# Patient Record
Sex: Female | Born: 1945
Health system: Southern US, Community
[De-identification: ages and names within clinical notes are randomized; demographics above are authoritative.]

## PROBLEM LIST (undated history)

## (undated) DIAGNOSIS — G709 Myoneural disorder, unspecified: Secondary | ICD-10-CM

## (undated) DIAGNOSIS — M1990 Unspecified osteoarthritis, unspecified site: Secondary | ICD-10-CM

## (undated) DIAGNOSIS — E785 Hyperlipidemia, unspecified: Secondary | ICD-10-CM

## (undated) DIAGNOSIS — R42 Dizziness and giddiness: Secondary | ICD-10-CM

## (undated) HISTORY — DX: Dizziness and giddiness: R42

## (undated) HISTORY — PX: ABDOMINAL HYSTERECTOMY: SHX81

## (undated) HISTORY — DX: Hyperlipidemia, unspecified: E78.5

---

## 2010-03-30 ENCOUNTER — Ambulatory Visit: Payer: Self-pay | Admitting: Family Medicine

## 2013-02-19 DIAGNOSIS — Z789 Other specified health status: Secondary | ICD-10-CM | POA: Diagnosis not present

## 2013-02-19 DIAGNOSIS — E785 Hyperlipidemia, unspecified: Secondary | ICD-10-CM | POA: Diagnosis not present

## 2013-02-19 DIAGNOSIS — E639 Nutritional deficiency, unspecified: Secondary | ICD-10-CM | POA: Diagnosis not present

## 2013-02-19 DIAGNOSIS — I10 Essential (primary) hypertension: Secondary | ICD-10-CM | POA: Diagnosis not present

## 2013-02-19 DIAGNOSIS — Z1159 Encounter for screening for other viral diseases: Secondary | ICD-10-CM | POA: Diagnosis not present

## 2013-02-19 DIAGNOSIS — E559 Vitamin D deficiency, unspecified: Secondary | ICD-10-CM | POA: Diagnosis not present

## 2013-02-28 ENCOUNTER — Ambulatory Visit: Payer: Self-pay | Admitting: Family Medicine

## 2013-02-28 DIAGNOSIS — Z1231 Encounter for screening mammogram for malignant neoplasm of breast: Secondary | ICD-10-CM | POA: Diagnosis not present

## 2013-03-05 DIAGNOSIS — Z0000 Encounter for general adult medical examination without abnormal findings: Secondary | ICD-10-CM | POA: Diagnosis not present

## 2013-10-01 DIAGNOSIS — Z23 Encounter for immunization: Secondary | ICD-10-CM | POA: Diagnosis not present

## 2014-12-24 DIAGNOSIS — E2839 Other primary ovarian failure: Secondary | ICD-10-CM | POA: Diagnosis not present

## 2014-12-24 DIAGNOSIS — E559 Vitamin D deficiency, unspecified: Secondary | ICD-10-CM | POA: Diagnosis not present

## 2014-12-24 DIAGNOSIS — Z124 Encounter for screening for malignant neoplasm of cervix: Secondary | ICD-10-CM | POA: Diagnosis not present

## 2014-12-24 DIAGNOSIS — Z1211 Encounter for screening for malignant neoplasm of colon: Secondary | ICD-10-CM | POA: Diagnosis not present

## 2014-12-24 DIAGNOSIS — E785 Hyperlipidemia, unspecified: Secondary | ICD-10-CM | POA: Diagnosis not present

## 2014-12-24 DIAGNOSIS — Z0000 Encounter for general adult medical examination without abnormal findings: Secondary | ICD-10-CM | POA: Diagnosis not present

## 2014-12-24 DIAGNOSIS — Z789 Other specified health status: Secondary | ICD-10-CM | POA: Diagnosis not present

## 2014-12-24 DIAGNOSIS — Z9181 History of falling: Secondary | ICD-10-CM | POA: Diagnosis not present

## 2014-12-24 DIAGNOSIS — M8580 Other specified disorders of bone density and structure, unspecified site: Secondary | ICD-10-CM | POA: Diagnosis not present

## 2014-12-24 DIAGNOSIS — Z1239 Encounter for other screening for malignant neoplasm of breast: Secondary | ICD-10-CM | POA: Diagnosis not present

## 2014-12-24 DIAGNOSIS — Z719 Counseling, unspecified: Secondary | ICD-10-CM | POA: Diagnosis not present

## 2014-12-24 DIAGNOSIS — Z1389 Encounter for screening for other disorder: Secondary | ICD-10-CM | POA: Diagnosis not present

## 2015-01-07 ENCOUNTER — Ambulatory Visit
Admit: 2015-01-07 | Disposition: A | Discharge status: Home / Self Care | Payer: Self-pay | Attending: Family Medicine | Admitting: Family Medicine

## 2015-01-07 DIAGNOSIS — M810 Age-related osteoporosis without current pathological fracture: Secondary | ICD-10-CM | POA: Diagnosis not present

## 2015-01-07 DIAGNOSIS — E2839 Other primary ovarian failure: Secondary | ICD-10-CM | POA: Diagnosis not present

## 2015-01-07 DIAGNOSIS — Z1231 Encounter for screening mammogram for malignant neoplasm of breast: Secondary | ICD-10-CM | POA: Diagnosis not present

## 2015-01-09 DIAGNOSIS — E785 Hyperlipidemia, unspecified: Secondary | ICD-10-CM | POA: Diagnosis not present

## 2015-01-09 DIAGNOSIS — M8580 Other specified disorders of bone density and structure, unspecified site: Secondary | ICD-10-CM | POA: Diagnosis not present

## 2015-01-09 DIAGNOSIS — Z789 Other specified health status: Secondary | ICD-10-CM | POA: Diagnosis not present

## 2015-01-09 DIAGNOSIS — E559 Vitamin D deficiency, unspecified: Secondary | ICD-10-CM | POA: Diagnosis not present

## 2015-01-09 DIAGNOSIS — M810 Age-related osteoporosis without current pathological fracture: Secondary | ICD-10-CM | POA: Diagnosis not present

## 2015-03-11 ENCOUNTER — Inpatient Hospital Stay
Admission: EM | Admit: 2015-03-11 | Discharge: 2015-03-18 | DRG: 336 | Disposition: A | Discharge status: Home / Self Care | Payer: Medicare Other | Attending: Surgery | Admitting: Surgery

## 2015-03-11 ENCOUNTER — Encounter: Payer: Self-pay | Admitting: *Deleted

## 2015-03-11 DIAGNOSIS — Z90710 Acquired absence of both cervix and uterus: Secondary | ICD-10-CM

## 2015-03-11 DIAGNOSIS — K5669 Other intestinal obstruction: Secondary | ICD-10-CM | POA: Diagnosis not present

## 2015-03-11 DIAGNOSIS — K449 Diaphragmatic hernia without obstruction or gangrene: Secondary | ICD-10-CM | POA: Diagnosis not present

## 2015-03-11 DIAGNOSIS — K769 Liver disease, unspecified: Secondary | ICD-10-CM | POA: Diagnosis not present

## 2015-03-11 DIAGNOSIS — K565 Intestinal adhesions [bands], unspecified as to partial versus complete obstruction: Secondary | ICD-10-CM

## 2015-03-11 DIAGNOSIS — R188 Other ascites: Secondary | ICD-10-CM | POA: Diagnosis not present

## 2015-03-11 DIAGNOSIS — R1031 Right lower quadrant pain: Secondary | ICD-10-CM | POA: Diagnosis not present

## 2015-03-11 DIAGNOSIS — Z8719 Personal history of other diseases of the digestive system: Secondary | ICD-10-CM | POA: Diagnosis present

## 2015-03-11 DIAGNOSIS — K5660 Unspecified intestinal obstruction: Secondary | ICD-10-CM | POA: Diagnosis not present

## 2015-03-11 LAB — CBC WITH DIFFERENTIAL/PLATELET
BASOS ABS: 0 10*3/uL (ref 0–0.1)
Basophils Relative: 0 %
EOS ABS: 0.1 10*3/uL (ref 0–0.7)
Eosinophils Relative: 1 %
HCT: 44.5 % (ref 35.0–47.0)
Hemoglobin: 14.6 g/dL (ref 12.0–16.0)
LYMPHS PCT: 14 %
Lymphs Abs: 1.2 10*3/uL (ref 1.0–3.6)
MCH: 26.8 pg (ref 26.0–34.0)
MCHC: 32.7 g/dL (ref 32.0–36.0)
MCV: 82.1 fL (ref 80.0–100.0)
MONOS PCT: 5 %
Monocytes Absolute: 0.5 10*3/uL (ref 0.2–0.9)
Neutro Abs: 6.9 10*3/uL — ABNORMAL HIGH (ref 1.4–6.5)
Neutrophils Relative %: 80 %
PLATELETS: 260 10*3/uL (ref 150–440)
RBC: 5.43 MIL/uL — AB (ref 3.80–5.20)
RDW: 13.9 % (ref 11.5–14.5)
WBC: 8.6 10*3/uL (ref 3.6–11.0)

## 2015-03-11 LAB — URINALYSIS COMPLETE WITH MICROSCOPIC (ARMC ONLY)
BILIRUBIN URINE: NEGATIVE
GLUCOSE, UA: NEGATIVE mg/dL
Ketones, ur: NEGATIVE mg/dL
LEUKOCYTES UA: NEGATIVE
Nitrite: NEGATIVE
Protein, ur: NEGATIVE mg/dL
SQUAMOUS EPITHELIAL / LPF: NONE SEEN
Specific Gravity, Urine: 1.006 (ref 1.005–1.030)
pH: 9 — ABNORMAL HIGH (ref 5.0–8.0)

## 2015-03-11 LAB — COMPREHENSIVE METABOLIC PANEL
ALBUMIN: 4.7 g/dL (ref 3.5–5.0)
ALT: 21 U/L (ref 14–54)
AST: 29 U/L (ref 15–41)
Alkaline Phosphatase: 93 U/L (ref 38–126)
Anion gap: 10 (ref 5–15)
BUN: 12 mg/dL (ref 6–20)
CO2: 28 mmol/L (ref 22–32)
Calcium: 9.3 mg/dL (ref 8.9–10.3)
Chloride: 100 mmol/L — ABNORMAL LOW (ref 101–111)
Creatinine, Ser: 0.75 mg/dL (ref 0.44–1.00)
GFR calc Af Amer: >60 mL/min (ref >60)
Glucose, Bld: 111 mg/dL — ABNORMAL HIGH (ref 65–99)
Potassium: 3.6 mmol/L (ref 3.5–5.1)
Sodium: 138 mmol/L (ref 135–145)
TOTAL PROTEIN: 8.6 g/dL — AB (ref 6.5–8.1)
Total Bilirubin: 0.4 mg/dL (ref 0.3–1.2)

## 2015-03-11 MED ORDER — ONDANSETRON HCL 4 MG/2ML IJ SOLN
INTRAMUSCULAR | Status: AC
  Administered 2015-03-12: 4 mg via INTRAVENOUS
  Filled 2015-03-11: qty 2

## 2015-03-11 MED ORDER — ONDANSETRON HCL 4 MG/2ML IJ SOLN
4.0000 mg | Freq: Once | INTRAMUSCULAR | Status: AC
  Administered 2015-03-12: 4 mg via INTRAVENOUS

## 2015-03-11 MED ORDER — MORPHINE SULFATE 2 MG/ML IJ SOLN
2.0000 mg | Freq: Once | INTRAMUSCULAR | Status: AC
  Administered 2015-03-12: 2 mg via INTRAVENOUS

## 2015-03-11 MED ORDER — IOHEXOL 240 MG/ML SOLN
25.0000 mL | Freq: Once | INTRAMUSCULAR | Status: AC | PRN
  Administered 2015-03-11: 25 mL via ORAL

## 2015-03-11 MED ORDER — MORPHINE SULFATE 2 MG/ML IJ SOLN
INTRAMUSCULAR | Status: AC
  Administered 2015-03-12: 2 mg via INTRAVENOUS
  Filled 2015-03-11: qty 1

## 2015-03-11 NOTE — ED Notes (Signed)
Pt to ED from home c/o lower right abdominal pain starting today around 1400.  Pt denies n/v/d.  Pain does not radiate.  Pt states "I want to go to the bathroom but I can't".  Pt states small bowel movement around 1800 tonight.

## 2015-03-11 NOTE — ED Provider Notes (Signed)
[facility] Emergency Department Provider Note  ____________________________________________  Time seen: 11:00 PM  I have reviewed the triage vital signs and the nursing notes.   HISTORY  Chief Complaint Abdominal Pain      HPI Paula Mills is a 69 y.o. female presents with acute onset of right lower quadrant pain currently 8 out of 10 and described as sharp. Onset at 2 PM today pain is nonradiating no nausea no vomiting no diarrhea and no fever.     History reviewed. No pertinent past medical history.  There are no active problems to display for this patient.   Past Surgical History  Procedure Laterality Date  . Abdominal hysterectomy    . Cesarean section      No current outpatient prescriptions on file.  Allergies Review of patient's allergies indicates no known allergies.  No family history on file.  Social History History  Substance Use Topics  . Smoking status: Never Smoker   . Smokeless tobacco: Not on file  . Alcohol Use: Yes    Review of Systems  Constitutional: Negative for fever. Eyes: Negative for visual changes. ENT: Negative for sore throat. Cardiovascular: Negative for chest pain. Respiratory: Negative for shortness of breath. Gastrointestinal: Positive for abdominal pain,  Genitourinary: Negative for dysuria. Musculoskeletal: Negative for back pain. Skin: Negative for rash. Neurological: Negative for headaches, focal weakness or numbness.   10-point ROS otherwise negative.  ____________________________________________   PHYSICAL EXAM:  VITAL SIGNS: ED Triage Vitals  Enc Vitals Group     BP 03/11/15 2045 176/99 mmHg     Pulse Rate 03/11/15 2045 72     Resp --      Temp 03/11/15 2045 98.2 F (36.8 C)     Temp Source 03/11/15 2045 Oral     SpO2 03/11/15 2045 96 %     Weight 03/11/15 2045 165 lb (74.844 kg)     Height 03/11/15 2045 5\' 2"  (1.575 m)     Head Cir --      Peak Flow --      Pain Score  03/11/15 2050 9     Pain Loc --      Pain Edu? --      Excl. in [place]? --      Constitutional: Alert and oriented. Well appearing and in no distress. Eyes: Conjunctivae are normal. PERRL. Normal extraocular movements. ENT   Head: Normocephalic and atraumatic.   Nose: No congestion/rhinnorhea.   Mouth/Throat: Mucous membranes are moist.   Neck: No stridor. Hematological/Lymphatic/Immunilogical: No cervical lymphadenopathy. Cardiovascular: Normal rate, regular rhythm. Normal and symmetric distal pulses are present in all extremities. No murmurs, rubs, or gallops. Respiratory: Normal respiratory effort without tachypnea nor retractions. Breath sounds are clear and equal bilaterally. No wheezes/rales/rhonchi. Gastrointestinal: Soft and nontender. No distention. There is no CVA tenderness. Genitourinary: deferred Musculoskeletal: Nontender with normal range of motion in all extremities. No joint effusions.  No lower extremity tenderness nor edema. Neurologic:  Normal speech and language. No gross focal neurologic deficits are appreciated. Speech is normal.  Skin:  Skin is warm, dry and intact. No rash noted. Psychiatric: Mood and affect are normal. Speech and behavior are normal. Patient exhibits appropriate insight and judgment.  ____________________________________________    LABS (pertinent positives/negatives)  Labs Reviewed  CBC WITH DIFFERENTIAL/PLATELET - Abnormal; Notable for the following:    RBC 5.43 (*)    Neutro Abs 6.9 (*)    All other components within normal limits  COMPREHENSIVE METABOLIC PANEL -  Abnormal; Notable for the following:    Chloride 100 (*)    Glucose, Bld 111 (*)    Total Protein 8.6 (*)    All other components within normal limits  URINALYSIS COMPLETEWITH MICROSCOPIC ([facility] ONLY) - Abnormal; Notable for the following:    Color, Urine STRAW (*)    APPearance CLEAR (*)    Hgb urine dipstick 1+ (*)    pH 9.0 (*)    Bacteria, UA RARE (*)     All other components within normal limits       RADIOLOGY  CT abdomen and pelvis revealed small bowel obstruction with transition point in the  ileum  ____________________________________________      INITIAL IMPRESSION / ASSESSMENT AND PLAN / ED COURSE  Pertinent labs & imaging results that were available during my care of the patient were reviewed by me and considered in my medical decision making (see chart for details).  Given history of physical exam concern for intraabdominal pathology will  perform CT scan of the abdomen and pelvis. Patient received IV morphine 2 mg Zofran 4 mg.  (----------------------------------------- 1:50 AM on 03/12/2015 -----------------------------------------  CT scan revealed small bowel obstruction as such Dr. Felton Clinton consulted  ____________________________________________   FINAL CLINICAL IMPRESSION(S) / ED DIAGNOSES  Final diagnoses:  None      Gregor Hams, MD 03/12/15 0151

## 2015-03-11 NOTE — ED Notes (Signed)
Right lower abdominal pain that started this afternoon, denies n/v/d or urinary symptoms

## 2015-03-12 ENCOUNTER — Emergency Department: Payer: Medicare Other

## 2015-03-12 DIAGNOSIS — Z8719 Personal history of other diseases of the digestive system: Secondary | ICD-10-CM | POA: Diagnosis present

## 2015-03-12 DIAGNOSIS — K5660 Unspecified intestinal obstruction: Secondary | ICD-10-CM | POA: Diagnosis not present

## 2015-03-12 DIAGNOSIS — K5669 Other intestinal obstruction: Secondary | ICD-10-CM | POA: Diagnosis not present

## 2015-03-12 DIAGNOSIS — Z90710 Acquired absence of both cervix and uterus: Secondary | ICD-10-CM | POA: Diagnosis not present

## 2015-03-12 DIAGNOSIS — K598 Other specified functional intestinal disorders: Secondary | ICD-10-CM | POA: Diagnosis not present

## 2015-03-12 DIAGNOSIS — K449 Diaphragmatic hernia without obstruction or gangrene: Secondary | ICD-10-CM | POA: Diagnosis not present

## 2015-03-12 DIAGNOSIS — R188 Other ascites: Secondary | ICD-10-CM | POA: Diagnosis present

## 2015-03-12 DIAGNOSIS — R109 Unspecified abdominal pain: Secondary | ICD-10-CM | POA: Diagnosis not present

## 2015-03-12 DIAGNOSIS — K565 Intestinal adhesions [bands] with obstruction (postprocedural) (postinfection): Secondary | ICD-10-CM | POA: Diagnosis not present

## 2015-03-12 DIAGNOSIS — K769 Liver disease, unspecified: Secondary | ICD-10-CM | POA: Diagnosis not present

## 2015-03-12 LAB — BASIC METABOLIC PANEL
Anion gap: 8 (ref 5–15)
BUN: 7 mg/dL (ref 6–20)
CO2: 26 mmol/L (ref 22–32)
Calcium: 8.6 mg/dL — ABNORMAL LOW (ref 8.9–10.3)
Chloride: 105 mmol/L (ref 101–111)
Creatinine, Ser: 0.59 mg/dL (ref 0.44–1.00)
GFR calc Af Amer: >60 mL/min (ref >60)
Glucose, Bld: 157 mg/dL — ABNORMAL HIGH (ref 65–99)
Potassium: 4 mmol/L (ref 3.5–5.1)
SODIUM: 139 mmol/L (ref 135–145)

## 2015-03-12 LAB — CBC
HEMATOCRIT: 44.1 % (ref 35.0–47.0)
Hemoglobin: 14.3 g/dL (ref 12.0–16.0)
MCH: 26.6 pg (ref 26.0–34.0)
MCHC: 32.5 g/dL (ref 32.0–36.0)
MCV: 81.9 fL (ref 80.0–100.0)
PLATELETS: 245 10*3/uL (ref 150–440)
RBC: 5.38 MIL/uL — ABNORMAL HIGH (ref 3.80–5.20)
RDW: 13.9 % (ref 11.5–14.5)
WBC: 8.2 10*3/uL (ref 3.6–11.0)

## 2015-03-12 MED ORDER — FLEET ENEMA 7-19 GM/118ML RE ENEM
1.0000 | ENEMA | Freq: Two times a day (BID) | RECTAL | Status: DC
  Administered 2015-03-12 – 2015-03-14 (×4): 1 via RECTAL

## 2015-03-12 MED ORDER — IOHEXOL 350 MG/ML SOLN
100.0000 mL | Freq: Once | INTRAVENOUS | Status: AC | PRN
  Administered 2015-03-12: 100 mL via INTRAVENOUS

## 2015-03-12 MED ORDER — HEPARIN SODIUM (PORCINE) 5000 UNIT/ML IJ SOLN
5000.0000 [IU] | Freq: Three times a day (TID) | INTRAMUSCULAR | Status: DC
  Administered 2015-03-12 – 2015-03-17 (×15): 5000 [IU] via SUBCUTANEOUS
  Filled 2015-03-12 (×16): qty 1

## 2015-03-12 MED ORDER — ZOLPIDEM TARTRATE 5 MG PO TABS: 5.0000 mg | ORAL_TABLET | Freq: Every evening | ORAL | Status: DC | PRN

## 2015-03-12 MED ORDER — MORPHINE SULFATE 2 MG/ML IJ SOLN
INTRAMUSCULAR | Status: AC
  Administered 2015-03-12: 2 mg via INTRAVENOUS
  Filled 2015-03-12: qty 1

## 2015-03-12 MED ORDER — MORPHINE SULFATE 2 MG/ML IJ SOLN
2.0000 mg | INTRAMUSCULAR | Status: DC | PRN
  Administered 2015-03-12 – 2015-03-14 (×11): 2 mg via INTRAVENOUS
  Filled 2015-03-12 (×12): qty 1

## 2015-03-12 MED ORDER — ACETAMINOPHEN 650 MG RE SUPP: 650.0000 mg | Freq: Four times a day (QID) | RECTAL | Status: DC | PRN

## 2015-03-12 MED ORDER — KCL IN DEXTROSE-NACL 20-5-0.9 MEQ/L-%-% IV SOLN
INTRAVENOUS | Status: DC
  Administered 2015-03-12 – 2015-03-17 (×11): via INTRAVENOUS
  Filled 2015-03-12 (×21): qty 1000

## 2015-03-12 MED ORDER — BUTAMBEN-TETRACAINE-BENZOCAINE 2-2-14 % EX AERO
1.0000 | INHALATION_SPRAY | Freq: Once | CUTANEOUS | Status: AC
  Administered 2015-03-12: 1 via TOPICAL

## 2015-03-12 MED ORDER — MORPHINE SULFATE 2 MG/ML IJ SOLN
2.0000 mg | Freq: Once | INTRAMUSCULAR | Status: AC
Start: 2015-03-12 — End: 2015-03-12
  Administered 2015-03-12: 2 mg via INTRAVENOUS

## 2015-03-12 MED ORDER — ONDANSETRON HCL 4 MG/2ML IJ SOLN
4.0000 mg | Freq: Four times a day (QID) | INTRAMUSCULAR | Status: DC | PRN
  Administered 2015-03-12 – 2015-03-14 (×3): 4 mg via INTRAVENOUS
  Filled 2015-03-12 (×3): qty 2

## 2015-03-12 MED ORDER — ACETAMINOPHEN 325 MG PO TABS: 650.0000 mg | ORAL_TABLET | Freq: Four times a day (QID) | ORAL | Status: DC | PRN

## 2015-03-12 MED ORDER — DOCUSATE SODIUM 283 MG RE ENEM: 1.0000 | ENEMA | Freq: Two times a day (BID) | RECTAL | Status: DC

## 2015-03-12 MED ORDER — BISACODYL 5 MG PO TBEC
10.0000 mg | DELAYED_RELEASE_TABLET | Freq: Two times a day (BID) | ORAL | Status: DC
  Administered 2015-03-12 – 2015-03-14 (×5): 10 mg via ORAL
  Filled 2015-03-12 (×5): qty 2

## 2015-03-12 MED ORDER — ONDANSETRON HCL 4 MG PO TABS: 4.0000 mg | ORAL_TABLET | Freq: Four times a day (QID) | ORAL | Status: DC | PRN

## 2015-03-12 MED ORDER — BUTAMBEN-TETRACAINE-BENZOCAINE 2-2-14 % EX AERO
1.0000 | INHALATION_SPRAY | Freq: Once | CUTANEOUS | Status: DC
  Filled 2015-03-12: qty 20

## 2015-03-12 NOTE — Progress Notes (Signed)
Subjective:   Pain controlled with morphine. Very little, clear, and nasogastric output. No flatus, no bowel movement.  Vital signs in last 24 hours: Temp:  [98.2 F (36.8 C)-98.5 F (36.9 C)] 98.5 F (36.9 C) (06/09 0820) Pulse Rate:  [70-81] 72 (06/09 0820) Resp:  [16-18] 18 (06/09 0820) BP: (143-177)/(73-99) 143/73 mmHg (06/09 0820) SpO2:  [94 %-97 %] 94 % (06/09 0820) Weight:  [73.619 kg (162 lb 4.8 oz)-74.844 kg (165 lb)] 73.619 kg (162 lb 4.8 oz) (06/09 0346) Last BM Date: 03/11/15  Intake/Output from previous day: 06/08 0701 - 06/09 0700 In: 0  Out: 50 [Emesis/NG output:50]  Exam:  Abdomen is mildly distended but not tympanitic or tender.  Lab Results:  CBC  Recent Labs  03/11/15 2104 03/12/15 0444  WBC 8.6 8.2  HGB 14.6 14.3  HCT 44.5 44.1  PLT 260 245   CMP     Component Value Date/Time   NA 139 03/12/2015 0843   K 4.0 03/12/2015 0843   CL 105 03/12/2015 0843   CO2 26 03/12/2015 0843   GLUCOSE 157* 03/12/2015 0843   BUN 7 03/12/2015 0843   CREATININE 0.59 03/12/2015 0843   CALCIUM 8.6* 03/12/2015 0843   PROT 8.6* 03/11/2015 2104   ALBUMIN 4.7 03/11/2015 2104   AST 29 03/11/2015 2104   ALT 21 03/11/2015 2104   ALKPHOS 93 03/11/2015 2104   BILITOT 0.4 03/11/2015 2104   GFRNONAA >60 03/12/2015 0843   GFRAA >60 03/12/2015 0843   PT/INR No results for input(s): LABPROT, INR in the last 72 hours.  Studies/Results: Ct Abdomen Pelvis W Contrast  03/12/2015   CLINICAL DATA:  Right lower quadrant abdominal pain, onset today at 14:00.  EXAM: CT ABDOMEN AND PELVIS WITH CONTRAST  TECHNIQUE: Multidetector CT imaging of the abdomen and pelvis was performed using the standard protocol following bolus administration of intravenous contrast.  CONTRAST:  127mL OMNIPAQUE IOHEXOL 350 MG/ML SOLN  COMPARISON:  None.  FINDINGS: There is abnormal dilatation of proximal and mid small bowel with abrupt transition to decompressed ileum. The transition point is in the mid  abdomen just above the level of the umbilicus. The colon is decompressed except for a small volume of rectal stool. There is no extraluminal air. There is no ascites. There is no abscess.  There are multiple hypodense liver lesions, most of which are too small to characterize. The largest measures 10.5 mm. The gallbladder appears unremarkable. Bile ducts appear unremarkable. Pancreas, spleen, adrenals and kidneys appear unremarkable.  There is prior hysterectomy.  There is a small hiatal hernia.  The abdominal aorta is normal in caliber with mild atherosclerotic calcification.  There is no significant abnormality in the lower chest.  There is no significant musculoskeletal lesion. There is grade 1 degenerative-appearing anterolisthesis at L4-5. Moderate facet arthritis is present at L4-5.  IMPRESSION: 1. Moderately high-grade small bowel obstruction in the mid abdomen with decompressed ileum and colon. 2. Multiple hypodense liver lesions, more likely cysts but not conclusively characterized. 3. Small hiatal hernia 4. Lower lumbar facet arthritis and mild spondylolisthesis   Electronically Signed   By: Andreas Newport M.D.   On: 03/12/2015 01:14    Assessment/Plan: My review of the CT scan shows significant stool in the transverse colon and rectum with minimal small bowel dilation, consistent more with constipation than a bowel obstruction.  Plan: Discontinue nasogastric tube. Enemas and Dulcolax tablets.

## 2015-03-12 NOTE — H&P (Signed)
Paula Mills is an 69 y.o. female.     Chief Complaint:   RLQ abdominal pain.  HPI:   69 y/o healthy female presents to the emergency room with a sudden onset of lower abdominal and right lower quadrant abdominal pain beginning acutely at approximately 2 PM on Tuesday afternoon. Prior to this the patient denies any abdominal pain and distention nor nausea vomiting or sick contacts. Since 2 PM the patient has had continuous pain which has been colicky in nature located in the lower abdomen and right lower quadrant. There's been mild nausea but no vomiting no diarrhea last bowel movement was yesterday and described as small. Last passage of flatus for at least 48 hours ago. Of note the patient had a previous cesarean section 2 as well as a hysterectomy for fibroid disease.  She has never had a history of prior bowel obstruction. She had no complications from any of her prior cesarean sections her hysterectomy. She is accompanied by her husband.  While in the emergency room patient's had an antimesenteric as well as morphine. She is feeling somewhat better. There is been really no alleviating factors. Has no history of diverticulitis or jaundice. There has been no fever.   History reviewed. No pertinent past medical history.  Past Surgical History  Procedure Laterality Date  . Abdominal hysterectomy    . Cesarean section     Family history is negative for cancer and hypertension.  History reviewed. No pertinent family history.   Social History:  reports that she has never smoked. She does not have any smokeless tobacco history on file. She reports that she drinks alcohol. She reports that she does not use illicit drugs.  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  Allergies: No Known Allergies   Review of Systems:   Review of Systems  Constitutional: Negative for fever, chills, weight loss and malaise/fatigue.  HENT: Negative.   Eyes: Negative.   Respiratory: Negative for cough, hemoptysis  and sputum production.   Cardiovascular: Negative for chest pain and palpitations.  Gastrointestinal: Positive for nausea and abdominal pain. Negative for heartburn, vomiting, diarrhea and constipation.  Genitourinary: Negative for dysuria and urgency.  Musculoskeletal: Negative.   Psychiatric/Behavioral: Negative.   All other systems reviewed and are negative.   Physical Exam:  Physical Exam  Constitutional: She is oriented to person, place, and time and well-developed, well-nourished, and in no distress. No distress.  HENT:  Head: Atraumatic.  Eyes: Conjunctivae and EOM are normal. Pupils are equal, round, and reactive to light.  Neck: Normal range of motion. Neck supple. No thyromegaly present.  Cardiovascular: Normal rate and regular rhythm.   Pulmonary/Chest: Effort normal and breath sounds normal. No respiratory distress. She has no wheezes.  Abdominal: Soft. Bowel sounds are normal. There is generalized tenderness and tenderness in the right lower quadrant. There is no rigidity, no guarding, no CVA tenderness and negative Murphy's sign. No hernia.    Neurological: She is alert and oriented to person, place, and time.  Skin: Skin is dry. She is not diaphoretic.  Psychiatric: Mood, memory, affect and judgment normal.    Blood pressure 154/83, pulse 74, temperature 98.2 F (36.8 C), temperature source Oral, height 5' 2"  (1.575 m), weight 74.844 kg (165 lb), SpO2 94 %.    Results for orders placed or performed during the hospital encounter of 03/11/15 (from the past 48 hour(s))  CBC with Differential     Status: Abnormal   Collection Time: 03/11/15  9:04 PM  Result Value Ref Range   WBC 8.6 3.6 - 11.0 K/uL   RBC 5.43 (H) 3.80 - 5.20 MIL/uL   Hemoglobin 14.6 12.0 - 16.0 g/dL   HCT 44.5 35.0 - 47.0 %   MCV 82.1 80.0 - 100.0 fL   MCH 26.8 26.0 - 34.0 pg   MCHC 32.7 32.0 - 36.0 g/dL   RDW 13.9 11.5 - 14.5 %   Platelets 260 150 - 440 K/uL   Neutrophils Relative % 80 %    Neutro Abs 6.9 (H) 1.4 - 6.5 K/uL   Lymphocytes Relative 14 %   Lymphs Abs 1.2 1.0 - 3.6 K/uL   Monocytes Relative 5 %   Monocytes Absolute 0.5 0.2 - 0.9 K/uL   Eosinophils Relative 1 %   Eosinophils Absolute 0.1 0 - 0.7 K/uL   Basophils Relative 0 %   Basophils Absolute 0.0 0 - 0.1 K/uL  Comprehensive metabolic panel     Status: Abnormal   Collection Time: 03/11/15  9:04 PM  Result Value Ref Range   Sodium 138 135 - 145 mmol/L   Potassium 3.6 3.5 - 5.1 mmol/L   Chloride 100 (L) 101 - 111 mmol/L   CO2 28 22 - 32 mmol/L   Glucose, Bld 111 (H) 65 - 99 mg/dL   BUN 12 6 - 20 mg/dL   Creatinine, Ser 0.75 0.44 - 1.00 mg/dL   Calcium 9.3 8.9 - 10.3 mg/dL   Total Protein 8.6 (H) 6.5 - 8.1 g/dL   Albumin 4.7 3.5 - 5.0 g/dL   AST 29 15 - 41 U/L   ALT 21 14 - 54 U/L   Alkaline Phosphatase 93 38 - 126 U/L   Total Bilirubin 0.4 0.3 - 1.2 mg/dL   GFR calc non Af Amer >60 >60 mL/min   GFR calc Af Amer >60 >60 mL/min    Comment: (NOTE) The eGFR has been calculated using the CKD EPI equation. This calculation has not been validated in all clinical situations. eGFR's persistently <60 mL/min signify possible Chronic Kidney Disease.    Anion gap 10 5 - 15  Urinalysis complete, with microscopic ([facility] only)     Status: Abnormal   Collection Time: 03/11/15  9:04 PM  Result Value Ref Range   Color, Urine STRAW (A) YELLOW   APPearance CLEAR (A) CLEAR   Glucose, UA NEGATIVE NEGATIVE mg/dL   Bilirubin Urine NEGATIVE NEGATIVE   Ketones, ur NEGATIVE NEGATIVE mg/dL   Specific Gravity, Urine 1.006 1.005 - 1.030   Hgb urine dipstick 1+ (A) NEGATIVE   pH 9.0 (H) 5.0 - 8.0   Protein, ur NEGATIVE NEGATIVE mg/dL   Nitrite NEGATIVE NEGATIVE   Leukocytes, UA NEGATIVE NEGATIVE   RBC / HPF 0-5 0 - 5 RBC/hpf   WBC, UA 0-5 0 - 5 WBC/hpf   Bacteria, UA RARE (A) NONE SEEN   Squamous Epithelial / LPF NONE SEEN NONE SEEN   Ct Abdomen Pelvis W Contrast  03/12/2015   CLINICAL DATA:  Right lower quadrant  abdominal pain, onset today at 14:00.  EXAM: CT ABDOMEN AND PELVIS WITH CONTRAST  TECHNIQUE: Multidetector CT imaging of the abdomen and pelvis was performed using the standard protocol following bolus administration of intravenous contrast.  CONTRAST:  159m OMNIPAQUE IOHEXOL 350 MG/ML SOLN  COMPARISON:  None.  FINDINGS: There is abnormal dilatation of proximal and mid small bowel with abrupt transition to decompressed ileum. The transition point is in the mid abdomen just above the level of the umbilicus. The  colon is decompressed except for a small volume of rectal stool. There is no extraluminal air. There is no ascites. There is no abscess.  There are multiple hypodense liver lesions, most of which are too small to characterize. The largest measures 10.5 mm. The gallbladder appears unremarkable. Bile ducts appear unremarkable. Pancreas, spleen, adrenals and kidneys appear unremarkable.  There is prior hysterectomy.  There is a small hiatal hernia.  The abdominal aorta is normal in caliber with mild atherosclerotic calcification.  There is no significant abnormality in the lower chest.  There is no significant musculoskeletal lesion. There is grade 1 degenerative-appearing anterolisthesis at L4-5. Moderate facet arthritis is present at L4-5.  IMPRESSION: 1. Moderately high-grade small bowel obstruction in the mid abdomen with decompressed ileum and colon. 2. Multiple hypodense liver lesions, more likely cysts but not conclusively characterized. 3. Small hiatal hernia 4. Lower lumbar facet arthritis and mild spondylolisthesis   Electronically Signed   By: Andreas Newport M.D.   On: 03/12/2015 01:14   I personally reviewed the CT scan images on the PACS monitor.  Assessment/Plan    69 year old female with prior abdominal and pelvic operations presents with sudden onset of abdominal pain and CT scan findings for small bowel obstruction. At present I do not see an urgent indication for surgical intervention.  I discussed with her and her husband insertion of a nasogastric tube with an attempt at nonoperative management. We will obtain follow-up x-rays and labs in the morning.  With the patient and her husband and her agreement with this plan and understand as outlined above. Hortencia Conradi, MD, FACS

## 2015-03-13 ENCOUNTER — Inpatient Hospital Stay: Payer: Medicare Other

## 2015-03-13 NOTE — Progress Notes (Signed)
Subjective:   The patient had 2 bowel movements last night and is passing some flatus. She denies nausea, vomiting. She wants to go home. She is still burping a lot and has not had her diet advanced.  Vital signs in last 24 hours: Temp:  [98 F (36.7 C)-98.6 F (37 C)] 98 F (36.7 C) (06/10 0827) Pulse Rate:  [66-75] 66 (06/10 0827) Resp:  [16-18] 16 (06/10 0041) BP: (149-169)/(74-88) 169/88 mmHg (06/10 0827) SpO2:  [96 %-98 %] 97 % (06/10 0827) Weight:  [75.524 kg (166 lb 8 oz)] 75.524 kg (166 lb 8 oz) (06/10 0446) Last BM Date: 03/12/15  Intake/Output from previous day: 06/09 0701 - 06/10 0700 In: 2898.2 [I.V.:2898.2] Out: 35 [Urine:15; Emesis/NG output:20]  Exam: Abdomen is soft, flat, nondistended, non-tympanitic.  Lab Results:  CBC  Recent Labs  03/11/15 2104 03/12/15 0444  WBC 8.6 8.2  HGB 14.6 14.3  HCT 44.5 44.1  PLT 260 245   CMP     Component Value Date/Time   NA 139 03/12/2015 0843   K 4.0 03/12/2015 0843   CL 105 03/12/2015 0843   CO2 26 03/12/2015 0843   GLUCOSE 157* 03/12/2015 0843   BUN 7 03/12/2015 0843   CREATININE 0.59 03/12/2015 0843   CALCIUM 8.6* 03/12/2015 0843   PROT 8.6* 03/11/2015 2104   ALBUMIN 4.7 03/11/2015 2104   AST 29 03/11/2015 2104   ALT 21 03/11/2015 2104   ALKPHOS 93 03/11/2015 2104   BILITOT 0.4 03/11/2015 2104   GFRNONAA >60 03/12/2015 0843   GFRAA >60 03/12/2015 0843   PT/INR No results for input(s): LABPROT, INR in the last 72 hours.  Studies/Results: Ct Abdomen Pelvis W Contrast  03/12/2015   CLINICAL DATA:  Right lower quadrant abdominal pain, onset today at 14:00.  EXAM: CT ABDOMEN AND PELVIS WITH CONTRAST  TECHNIQUE: Multidetector CT imaging of the abdomen and pelvis was performed using the standard protocol following bolus administration of intravenous contrast.  CONTRAST:  138mL OMNIPAQUE IOHEXOL 350 MG/ML SOLN  COMPARISON:  None.  FINDINGS: There is abnormal dilatation of proximal and mid small bowel with abrupt  transition to decompressed ileum. The transition point is in the mid abdomen just above the level of the umbilicus. The colon is decompressed except for a small volume of rectal stool. There is no extraluminal air. There is no ascites. There is no abscess.  There are multiple hypodense liver lesions, most of which are too small to characterize. The largest measures 10.5 mm. The gallbladder appears unremarkable. Bile ducts appear unremarkable. Pancreas, spleen, adrenals and kidneys appear unremarkable.  There is prior hysterectomy.  There is a small hiatal hernia.  The abdominal aorta is normal in caliber with mild atherosclerotic calcification.  There is no significant abnormality in the lower chest.  There is no significant musculoskeletal lesion. There is grade 1 degenerative-appearing anterolisthesis at L4-5. Moderate facet arthritis is present at L4-5.  IMPRESSION: 1. Moderately high-grade small bowel obstruction in the mid abdomen with decompressed ileum and colon. 2. Multiple hypodense liver lesions, more likely cysts but not conclusively characterized. 3. Small hiatal hernia 4. Lower lumbar facet arthritis and mild spondylolisthesis   Electronically Signed   By: Andreas Newport M.D.   On: 03/12/2015 01:14    Assessment/Plan:  Clear liquids today. Full liquid diet in the morning. Possible discharge home tomorrow.

## 2015-03-14 ENCOUNTER — Encounter
Admission: EM | Disposition: A | Discharge status: Home / Self Care | Payer: Self-pay | Source: Home / Self Care | Attending: Surgery

## 2015-03-14 ENCOUNTER — Encounter: Payer: Self-pay | Admitting: Anesthesiology

## 2015-03-14 ENCOUNTER — Inpatient Hospital Stay: Payer: Medicare Other | Admitting: Anesthesiology

## 2015-03-14 DIAGNOSIS — K565 Intestinal adhesions [bands] with obstruction (postprocedural) (postinfection): Secondary | ICD-10-CM

## 2015-03-14 HISTORY — PX: LAPAROTOMY: SHX154

## 2015-03-14 HISTORY — PX: APPENDECTOMY: SHX54

## 2015-03-14 LAB — GLUCOSE, CAPILLARY: GLUCOSE-CAPILLARY: 157 mg/dL — AB (ref 65–99)

## 2015-03-14 SURGERY — LAPAROTOMY, EXPLORATORY
Anesthesia: General | Site: Abdomen | Wound class: Clean

## 2015-03-14 MED ORDER — PROPOFOL 10 MG/ML IV BOLUS
INTRAVENOUS | Status: DC | PRN
  Administered 2015-03-14: 100 mg via INTRAVENOUS

## 2015-03-14 MED ORDER — FENTANYL CITRATE (PF) 100 MCG/2ML IJ SOLN
INTRAMUSCULAR | Status: DC | PRN
  Administered 2015-03-14: 50 ug via INTRAVENOUS
  Administered 2015-03-14: 100 ug via INTRAVENOUS

## 2015-03-14 MED ORDER — SUCCINYLCHOLINE CHLORIDE 20 MG/ML IJ SOLN
INTRAMUSCULAR | Status: DC | PRN
  Administered 2015-03-14: 100 mg via INTRAVENOUS
  Administered 2015-03-14: 30 mg via INTRAVENOUS

## 2015-03-14 MED ORDER — SODIUM CHLORIDE 0.9 % IV SOLN
INTRAVENOUS | Status: DC | PRN
  Administered 2015-03-14: 16:00:00 via INTRAVENOUS

## 2015-03-14 MED ORDER — MORPHINE SULFATE 2 MG/ML IJ SOLN
2.0000 mg | INTRAMUSCULAR | Status: DC | PRN
  Administered 2015-03-15 – 2015-03-16 (×3): 4 mg via INTRAVENOUS
  Administered 2015-03-16: 2 mg via INTRAVENOUS
  Filled 2015-03-14 (×3): qty 2
  Filled 2015-03-14: qty 1

## 2015-03-14 MED ORDER — SODIUM CHLORIDE 0.9 % IV SOLN
3.0000 g | INTRAVENOUS | Status: AC
  Administered 2015-03-14: 3 g via INTRAVENOUS
  Filled 2015-03-14: qty 3

## 2015-03-14 MED ORDER — FENTANYL CITRATE (PF) 100 MCG/2ML IJ SOLN
25.0000 ug | INTRAMUSCULAR | Status: DC | PRN
  Administered 2015-03-14 (×4): 25 ug via INTRAVENOUS

## 2015-03-14 MED ORDER — ONDANSETRON HCL 4 MG/2ML IJ SOLN
INTRAMUSCULAR | Status: DC | PRN
  Administered 2015-03-14: 4 mg via INTRAVENOUS

## 2015-03-14 MED ORDER — LIDOCAINE HCL (CARDIAC) 20 MG/ML IV SOLN
INTRAVENOUS | Status: DC | PRN
  Administered 2015-03-14: 50 mg via INTRAVENOUS

## 2015-03-14 MED ORDER — FENTANYL CITRATE (PF) 100 MCG/2ML IJ SOLN
INTRAMUSCULAR | Status: AC
  Filled 2015-03-14: qty 2

## 2015-03-14 MED ORDER — FAMOTIDINE IN NACL 20-0.9 MG/50ML-% IV SOLN
20.0000 mg | Freq: Two times a day (BID) | INTRAVENOUS | Status: DC
  Administered 2015-03-15 – 2015-03-17 (×7): 20 mg via INTRAVENOUS
  Filled 2015-03-14 (×8): qty 50

## 2015-03-14 MED ORDER — SUGAMMADEX SODIUM 200 MG/2ML IV SOLN
INTRAVENOUS | Status: DC | PRN
  Administered 2015-03-14: 151.2 mg via INTRAVENOUS

## 2015-03-14 MED ORDER — PHENOL 1.4 % MT LIQD: 1.0000 | OROMUCOSAL | Status: DC | PRN

## 2015-03-14 MED ORDER — ROCURONIUM BROMIDE 100 MG/10ML IV SOLN
INTRAVENOUS | Status: DC | PRN
  Administered 2015-03-14: 10 mg via INTRAVENOUS

## 2015-03-14 MED ORDER — PROMETHAZINE HCL 25 MG/ML IJ SOLN: 12.5000 mg | INTRAMUSCULAR | Status: DC | PRN

## 2015-03-14 MED ORDER — MIDAZOLAM HCL 2 MG/2ML IJ SOLN
INTRAMUSCULAR | Status: DC | PRN
  Administered 2015-03-14: 2 mg via INTRAVENOUS

## 2015-03-14 MED ORDER — ONDANSETRON HCL 4 MG/2ML IJ SOLN: 4.0000 mg | Freq: Once | INTRAMUSCULAR | Status: AC | PRN

## 2015-03-14 SURGICAL SUPPLY — 30 items
CANISTER SUCT 3000ML (MISCELLANEOUS) ×3 IMPLANT
CATH TRAY 16F METER LATEX (MISCELLANEOUS) ×3 IMPLANT
CHLORAPREP W/TINT 26ML (MISCELLANEOUS) ×3 IMPLANT
DRAPE LAPAROTOMY 100X77 ABD (DRAPES) ×3 IMPLANT
DRSG OPSITE POSTOP 4X8 (GAUZE/BANDAGES/DRESSINGS) ×3 IMPLANT
ELECT CAUTERY BLADE 6.4 (BLADE) ×3 IMPLANT
GLOVE BIO SURGEON STRL SZ7.5 (GLOVE) ×9 IMPLANT
GOWN STRL REUS W/ TWL LRG LVL3 (GOWN DISPOSABLE) ×4 IMPLANT
GOWN STRL REUS W/TWL LRG LVL3 (GOWN DISPOSABLE) ×8
KIT RM TURNOVER STRD PROC AR (KITS) ×3 IMPLANT
LABEL OR SOLS (LABEL) ×3 IMPLANT
NS IRRIG 1000ML POUR BTL (IV SOLUTION) ×3 IMPLANT
PACK BASIN MAJOR ARMC (MISCELLANEOUS) ×3 IMPLANT
PACK COLON CLEAN CLOSURE (MISCELLANEOUS) ×3 IMPLANT
PAD GROUND ADULT SPLIT (MISCELLANEOUS) ×3 IMPLANT
SHEARS HARMONIC STRL 23CM (MISCELLANEOUS) ×3 IMPLANT
STAPLER PROXIMATE 55 BLUE (STAPLE) ×3 IMPLANT
STAPLER SKIN PROX 35W (STAPLE) ×3 IMPLANT
SUT MNCRL 3 0 RB1 (SUTURE) ×4 IMPLANT
SUT MONOCRYL 3 0 RB1 (SUTURE) ×8
SUT PDS #1 CTX NDL (SUTURE) ×6 IMPLANT
SUT PDS AB 1 CT  36 (SUTURE)
SUT PDS AB 1 CT 36 (SUTURE) IMPLANT
SUT PDS AB 1 CT1 36 (SUTURE) IMPLANT
SUT PDS AB 1 TP1 54 (SUTURE) ×6 IMPLANT
SUT PROLENE 3 0 CT 1 (SUTURE) ×6 IMPLANT
SUT SILK 2 0 (SUTURE) ×2
SUT SILK 2 0 SH (SUTURE) ×6 IMPLANT
SUT SILK 2-0 30XBRD TIE 12 (SUTURE) ×1 IMPLANT
SUT SILK 3-0 (SUTURE) ×3 IMPLANT

## 2015-03-14 NOTE — Transfer of Care (Signed)
Immediate Anesthesia Transfer of Care Note  Patient: Paula Mills  Procedure(s) Performed: Procedure(s): EXPLORATORY LAPAROTOMY (N/A) APPENDECTOMY (N/A)  Patient Location: PACU  Anesthesia Type:General  Level of Consciousness: Alert, Awake, Oriented  Airway & Oxygen Therapy: Patient Spontanous Breathing  Post-op Assessment: Report given to RN  Post vital signs: Reviewed and stable  Last Vitals:  Filed Vitals:   03/14/15 1738  BP: 173/108  Pulse: 99  Temp: 37.3 C  Resp: 16    Complications: No apparent anesthesia complications

## 2015-03-14 NOTE — Anesthesia Procedure Notes (Signed)
Procedure Name: Intubation Date/Time: 03/14/2015 4:22 PM Performed by: Eliberto Ivory Pre-anesthesia Checklist: Patient identified, Patient being monitored, Timeout performed, Emergency Drugs available and Suction available Patient Re-evaluated:Patient Re-evaluated prior to inductionOxygen Delivery Method: Circle system utilized Preoxygenation: Pre-oxygenation with 100% oxygen Intubation Type: IV induction Ventilation: Mask ventilation without difficulty Laryngoscope Size: Mac and 3 Grade View: Grade I Tube type: Oral Tube size: 7.5 mm Number of attempts: 1 Placement Confirmation: ETT inserted through vocal cords under direct vision,  positive ETCO2 and breath sounds checked- equal and bilateral Secured at: 21 cm Tube secured with: Tape Dental Injury: Teeth and Oropharynx as per pre-operative assessment

## 2015-03-14 NOTE — Anesthesia Postprocedure Evaluation (Signed)
  Anesthesia Post-op Note  Patient: Paula Mills  Procedure(s) Performed: Procedure(s): EXPLORATORY LAPAROTOMY (N/A) APPENDECTOMY (N/A)  Anesthesia type:General  Patient location: PACU  Post pain: Pain level controlled  Post assessment: Post-op Vital signs reviewed, Patient's Cardiovascular Status Stable, Respiratory Function Stable, Patent Airway and No signs of Nausea or vomiting  Post vital signs: Reviewed and stable  Last Vitals:  Filed Vitals:   03/14/15 1853  BP: 154/87  Pulse: 56  Temp: 36.4 C  Resp: 18    Level of consciousness: awake, alert  and patient cooperative  Complications: No apparent anesthesia complications.

## 2015-03-14 NOTE — Progress Notes (Signed)
Subjective:   No flatus since yesterday, abdominal pain is worse but is still waxing and waning. She vomited some clear liquids earlier today.  Vital signs in last 24 hours: Temp:  [98.3 F (36.8 C)] 98.3 F (36.8 C) (06/11 0814) Pulse Rate:  [74-83] 74 (06/11 0814) Resp:  [16] 16 2023-03-22 2350) BP: (141-166)/(76-80) 166/80 mmHg (06/11 0814) SpO2:  [95 %-97 %] 95 % (06/11 0814) Weight:  [75.569 kg (166 lb 9.6 oz)] 75.569 kg (166 lb 9.6 oz) (06/11 0600) Last BM Date: 2015/03/22  Intake/Output from previous day: March 22, 2023 0701 - 06/11 0700 In: 2838.3 [P.O.:240; I.V.:2598.3] Out: 4034 [Urine:1750; Emesis/NG output:25]  Exam:  Abdomen is not particularly distended but is a little bit tympanitic. Nontender.  Lab Results:  CBC  Recent Labs  03/11/15 2104 03/12/15 0444  WBC 8.6 8.2  HGB 14.6 14.3  HCT 44.5 44.1  PLT 260 245   CMP     Component Value Date/Time   NA 139 03/12/2015 0843   K 4.0 03/12/2015 0843   CL 105 03/12/2015 0843   CO2 26 03/12/2015 0843   GLUCOSE 157* 03/12/2015 0843   BUN 7 03/12/2015 0843   CREATININE 0.59 03/12/2015 0843   CALCIUM 8.6* 03/12/2015 0843   PROT 8.6* 03/11/2015 2104   ALBUMIN 4.7 03/11/2015 2104   AST 29 03/11/2015 2104   ALT 21 03/11/2015 2104   ALKPHOS 93 03/11/2015 2104   BILITOT 0.4 03/11/2015 2104   GFRNONAA >60 03/12/2015 0843   GFRAA >60 03/12/2015 0843   PT/INR No results for input(s): LABPROT, INR in the last 72 hours.  Studies/Results: Dg Abd 2 Views  03/22/2015   CLINICAL DATA:  Right lower quadrant pain for 2 days. Small bowel obstruction due to adhesions.  EXAM: ABDOMEN - 2 VIEW  COMPARISON:  CT abdomen and pelvis 03/12/2015  FINDINGS: There is no evidence of intraperitoneal free air. There is a dilated loop of small bowel in the central abdomen on upright images containing two air-fluid levels of equal height. This loop of bowel measures up to approximately 4.5 cm in diameter, overall mildly increased compared to  yesterday's CT. Oral contrast is present in this loop of bowel, with a small amount of contrast also present in a few small-bowel loops in the right mid abdomen. No definite contrast in more distal small bowel or colon. There are numerous additional loops of mildly dilated gas-filled small bowel throughout the central and upper abdomen, overall mildly increased compared to the prior CT. Surgical clips are present in the pelvis. Excreted IV contrast is noted in the bladder. A small amount of stool is present in the colon, which is largely decompressed.  IMPRESSION: Mildly increased dilatation of multiple small bowel loops consistent with persistent/worsening obstruction.   Electronically Signed   By: Logan Bores   On: 2015-03-22 10:00    Assessment/Plan: No resolution of small bowel obstruction. The patient and her family have consented to exploratory laparotomy and understand that, depending on the findings and procedure, she will likely need an additional 3-6 days of hospitalization.

## 2015-03-14 NOTE — Anesthesia Preprocedure Evaluation (Addendum)
Anesthesia Evaluation  Patient identified by MRN, date of birth, ID band Patient awake    Reviewed: Allergy & Precautions, NPO status , Patient's Chart, lab work & pertinent test results  Airway Mallampati: II  TM Distance: >3 FB     Dental  (+) Chipped   Pulmonary          Cardiovascular     Neuro/Psych    GI/Hepatic   Endo/Other    Renal/GU      Musculoskeletal   Abdominal   Peds  Hematology   Anesthesia Other Findings   Reproductive/Obstetrics                           Anesthesia Physical Anesthesia Plan  ASA: III  Anesthesia Plan: General   Post-op Pain Management:    Induction: Intravenous  Airway Management Planned: Oral ETT  Additional Equipment:   Intra-op Plan:   Post-operative Plan:   Informed Consent: I have reviewed the patients History and Physical, chart, labs and discussed the procedure including the risks, benefits and alternatives for the proposed anesthesia with the patient or authorized representative who has indicated his/her understanding and acceptance.     Plan Discussed with: CRNA  Anesthesia Plan Comments:         Anesthesia Quick Evaluation

## 2015-03-14 NOTE — Op Note (Signed)
Operative Note  Preoperative Diagnosis: Bowel obstruction  Postoperative Diagnosis: Same, adhesive  Operation Performed: Adhesiolysis, incidental appendectomy  Surgeon: Laverle Patter., M.D.   Assistant: None  Anesthesia: General Endotracheal  Date of Procedure: 03/14/2015   Procedure in Detail:  The risks (including the possibility of adjacent organ injury, the necessity of converting to an open procedure, and the risk of postoperative infection / abscess), potential benefits, non-surgical treatment options, and expected outcomes were reviewed with the patient. The patient concurred with the proposed plan and agreed to proceed, giving informed consent.   Prior to the induction of anesthesia, antibiotic prophylaxis was administered. The patient was placed supine on the OR table and prepped and draped in the usual sterile fashion.   A limited periumbilical midline incision was made and carried down through the subcutaneous tissue to the linea alba which was opened and the peritoneum was entered carefully. There was a fair amount of clear ascites present and multiple loops of dilated small intestine. There was a single adhesive band near the sacral promontory, which was ecchymotic, and this was divided with electrocautery. There was no single band like mark on the small intestine. Rather, the entire distal small intestine was dilated and slightly discolored, and the proximal jejunum was completely decompressed. The dilated small intestine went all the way to the ileocecal valve. Thus, I believe that the majority of the intestine had migrated beneath the adhesive band that I lysed. There were some interesting bizarre, nonpathologic adhesions surrounding the very long at appendix however, and I lysed all of these excising some and in doing so divided the mesial appendix with a 2-0 silk ligature and performed an incidental appendectomy with the GIA stapling device. The small intestine was replaced  in its anatomic position and the transverse mesial colon was draped over top of it as the omentum was diminutive. The linea alba was closed with a running #1 PDS suture and the skin reapproximated with a skin stapling device and a sterile dressing was applied.  Findings: See above          Specimens: Normal appendix           Complications: None; the patient tolerated the procedure well.   Consuela Mimes, MD 03/14/2015

## 2015-03-15 LAB — GLUCOSE, CAPILLARY: GLUCOSE-CAPILLARY: 151 mg/dL — AB (ref 65–99)

## 2015-03-15 NOTE — Progress Notes (Signed)
1 Day Post-Op   Subjective:  No nasogastric output, no nausea, minimal abdominal pain, but no flatus or bowel movement.  Vital signs in last 24 hours: Temp:  [97.5 F (36.4 C)-99.1 F (37.3 C)] 98.4 F (36.9 C) (06/12 0811) Pulse Rate:  [56-133] 92 (06/12 0811) Resp:  [14-20] 16 (06/12 0811) BP: (151-173)/(81-108) 166/82 mmHg (06/12 0811) SpO2:  [94 %-100 %] 94 % (06/12 0811) Weight:  [77.248 kg (170 lb 4.8 oz)] 77.248 kg (170 lb 4.8 oz) (06/12 0500) Last BM Date: 03/13/15  Intake/Output from previous day: 06/11 0701 - 06/12 0700 In: 3813.4 [P.O.:720; I.V.:3023.4; NG/GT:20; IV Piggyback:50] Out: 93 [Urine:630; Emesis/NG output:50; Blood:50]  GI: soft, non-tender; bowel sounds normal; no masses,  no organomegaly  Lab Results:  CBC No results for input(s): WBC, HGB, HCT, PLT in the last 72 hours. CMP     Component Value Date/Time   NA 139 03/12/2015 0843   K 4.0 03/12/2015 0843   CL 105 03/12/2015 0843   CO2 26 03/12/2015 0843   GLUCOSE 157* 03/12/2015 0843   BUN 7 03/12/2015 0843   CREATININE 0.59 03/12/2015 0843   CALCIUM 8.6* 03/12/2015 0843   PROT 8.6* 03/11/2015 2104   ALBUMIN 4.7 03/11/2015 2104   AST 29 03/11/2015 2104   ALT 21 03/11/2015 2104   ALKPHOS 93 03/11/2015 2104   BILITOT 0.4 03/11/2015 2104   GFRNONAA >60 03/12/2015 0843   GFRAA >60 03/12/2015 0843   PT/INR No results for input(s): LABPROT, INR in the last 72 hours.  Studies/Results: No results found.  Assessment/Plan: Discontinue nasogastric tube. Continue very limited ice chips for now. Patient and family understand that she is not to strain her abdominal wall for 6 weeks, that her small intestine needs to recover function prior to her beginning a diet, and that she has a 2% lifetime risk of recurrent small bowel obstruction.

## 2015-03-15 NOTE — Progress Notes (Signed)
Called Dr. Rexene Edison @ [phone] to ask if NG tube can be removed per patient request.  I was told to inform patient that we have to leave tube in place just in case she vomits.  Christene Slates  03/15/2015  1:46 AM

## 2015-03-16 ENCOUNTER — Encounter: Payer: Self-pay | Admitting: Surgery

## 2015-03-16 LAB — PLATELET COUNT: PLATELETS: 159 10*3/uL (ref 150–440)

## 2015-03-16 NOTE — Care Management Note (Signed)
Case Management Note  Patient Details  Name: Paula Mills MRN: [id number] Date of Birth: 07-Dec-1945  Subjective/Objective:       69yo Ms Kamyia Thomason received a surgical abdominal lysis of adhesions on 03/14/15. She resides at home with her husband Chantille Navarrete ph: [phone].  PCP= Dr Gwynneth Aliment, Pharmacy = [facility] on [address]. She has never had home health services in the past and has never been to rehab. Mrs Darwin has no home assistance equipment at home. Anticipate discharge home with home health PT and with a rolling walker. Family provided with a list of local home health providers from which to choose.            Action/Plan:   Expected Discharge Date:                  Expected Discharge Plan:  [place]  In-House Referral:  NA  Discharge planning Services     Post Acute Care Choice:  Home Health, Durable Medical Equipment Choice offered to:  Patient, Spouse  DME Arranged:    DME Agency:     HH Arranged:    [place] Agency:     Status of Service:     Medicare Important Message Given:  Yes Date Medicare IM Given:  03/13/15 Medicare IM give by:  Marshell Garfinkel Date Additional Medicare IM Given:  03/16/15 Additional Medicare Important Message give by:  LR Case Manager  If discussed at [place] of Stay Meetings, dates discussed:    Additional Comments:  Wynonna Fitzhenry A, RN 03/16/2015, 9:58 AM

## 2015-03-16 NOTE — Progress Notes (Signed)
2 Days Post-Op  Subjective: Postop from lysis of adhesions. No flatus yet. No nausea or vomiting either. No nasogastric tube in place.  Objective: Vital signs in last 24 hours: Temp:  [98.2 F (36.8 C)-98.6 F (37 C)] 98.4 F (36.9 C) (06/13 0754) Pulse Rate:  [77-87] 78 (06/13 0754) Resp:  [16-17] 17 (06/13 0754) BP: (140-169)/(74-87) 144/74 mmHg (06/13 0754) SpO2:  [95 %-97 %] 96 % (06/13 0754) Weight:  [77.338 kg (170 lb 8 oz)] 77.338 kg (170 lb 8 oz) (06/13 0500) Last BM Date: 03/13/15  Intake/Output from previous day: 06/12 0701 - 06/13 0700 In: 2942.3 [I.V.:2942.3] Out: 1050 [Urine:1050] Intake/Output this shift: Total I/O In: -  Out: 275 [Urine:275]  Physical exam:  Abdomen is soft nondistended and nontender. Wound is dressed. Calves are nontender.  Lab Results: CBC   Recent Labs  03/16/15 0445  PLT 159   BMET No results for input(s): NA, K, CL, CO2, GLUCOSE, BUN, CREATININE, CALCIUM in the last 72 hours. PT/INR No results for input(s): LABPROT, INR in the last 72 hours. ABG No results for input(s): PHART, HCO3 in the last 72 hours.  Invalid input(s): PCO2, PO2  Studies/Results: No results found.  Anti-infectives: Anti-infectives    Start     Dose/Rate Route Frequency Ordered Stop   03/14/15 1600  Ampicillin-Sulbactam (UNASYN) 3 g in sodium chloride 0.9 % 100 mL IVPB     3 g 100 mL/hr over 60 Minutes Intravenous STAT 03/14/15 1511 03/14/15 1700      Assessment/Plan: s/p Procedure(s): EXPLORATORY LAPAROTOMY APPENDECTOMY   This a patient status post lysis of adhesions. She is doing quite well. Awaiting bowel function when diet can be advanced.Florene Glen, MD, FACS  03/16/2015

## 2015-03-16 NOTE — Care Management Note (Signed)
Case Management Note  Patient Details  Name: Paula Mills MRN: [id number] Date of Birth: 1946/04/10  Subjective/Objective:                    Action/Plan:   Expected Discharge Date:                  Expected Discharge Plan:     In-House Referral:     Discharge planning Services     Post Acute Care Choice:    Choice offered to:     DME Arranged:    DME Agency:     HH Arranged:    [place] Agency:     Status of Service:     Medicare Important Message Given:  Yes Date Medicare IM Given:  03/13/15 Medicare IM give by:  Marshell Garfinkel Date Additional Medicare IM Given:   03/16/15    Additional Medicare Important Message give by:   MR Case Manager  If discussed at [place] of Stay Meetings, dates discussed:    Additional Comments:  Laden Fieldhouse A, RN 03/16/2015, 8:51 AM

## 2015-03-16 NOTE — Progress Notes (Signed)
Initial Nutrition Assessment  DOCUMENTATION CODES:      NUTRITION DIAGNOSIS:  Inadequate oral intake related to altered GI function as evidenced by NPO status.    GOAL:   (Goal would be for diet to be progressed when medically appropriate)    MONITOR:   (Energy intake, Digestive system)  REASON FOR ASSESSMENT:  NPO/Clear Liquid Diet    ASSESSMENT:  Pt admitted with bowel obstruction, s/p adhesiolysis and incidental appendectomy.   History reviewed. No pertinent past medical history.  Current Nutrition: NPO day 5 of admission   Nutrition Prior to Admission  Pt reports normal intake prior to admission, eating 3 meals per day, good amount at each meal  Digestive system: NG tube discontinued yesterday, no nausea, no flatus or BM noted  Labs:  Electrolyte and Renal Profile:  Recent Labs Lab 03/11/15 2104 03/12/15 0843  BUN 12 7  CREATININE 0.75 0.59  NA 138 139  K 3.6 4.0   Glucose Profile:  Recent Labs  03/14/15 2121 03/15/15 0809  GLUCAP 157* 151*      Medications: D5 NS at 171ml/hr   Physical  Findings:   Unable to complete Nutrition-Focused physical exam at this time.    Weight Change: stable weight prior to admission   Height:  Ht Readings from Last 1 Encounters:  03/12/15 5\' 2"  (1.575 m)    Weight:  Wt Readings from Last 1 Encounters:  03/16/15 170 lb 8 oz (77.338 kg)    Wt Readings from Last 10 Encounters:  03/16/15 170 lb 8 oz (77.338 kg)    BMI:  Body mass index is 31.18 kg/(m^2).  Estimated Nutritional Needs:  Kcal:  Using IBW of 50kg (BEE 978 kcals (IF 1.0-1.3, AF 1.3) 2248-2500 kcals/d  Protein:  Using IBW of 50kg (1.0-1.2 g/kg)50-60 g/d  Fluid:  Using IBW of 50kg (25-48ml/kg) 1250-1530ml/d  Skin:  Reviewed, no issues  Diet Order:   NPO  EDUCATION NEEDS:  No education needs identified at this time   Intake/Output Summary (Last 24 hours) at 03/16/15 1009 Last data filed at 03/16/15 0754  Gross per 24  hour  Intake   2459 ml  Output   1125 ml  Net   1334 ml    MODERATE Care Level Tyjuan Demetro B. Zenia Resides, [place], [place] (pager)

## 2015-03-17 LAB — SURGICAL PATHOLOGY

## 2015-03-17 NOTE — Progress Notes (Signed)
Let nurse know 

## 2015-03-17 NOTE — Progress Notes (Signed)
3 Days Post-Op  Subjective: Feeling much better tolerating clears wants to go home  Objective: Vital signs in last 24 hours: Temp:  [98.1 F (36.7 C)-98.4 F (36.9 C)] 98.2 F (36.8 C) (06/14 0805) Pulse Rate:  [82-92] 84 (06/14 0805) Resp:  [16-18] 18 (06/14 0805) BP: (141-183)/(67-92) 141/67 mmHg (06/14 0805) SpO2:  [95 %-96 %] 95 % (06/14 0805) Weight:  [75.887 kg (167 lb 4.8 oz)] 75.887 kg (167 lb 4.8 oz) (06/14 0500) Last BM Date: 03/13/15  Intake/Output from previous day: 06/13 0701 - 06/14 0700 In: 1429.5 [I.V.:1279.5; IV Piggyback:150] Out: 3832 [Urine:1675] Intake/Output this shift: Total I/O In: 597 [I.V.:597] Out: -   Physical exam:  Soft nontender nondistended abdomen wound is clean  Lab Results: CBC   Recent Labs  03/16/15 0445  PLT 159   BMET No results for input(s): NA, K, CL, CO2, GLUCOSE, BUN, CREATININE, CALCIUM in the last 72 hours. PT/INR No results for input(s): LABPROT, INR in the last 72 hours. ABG No results for input(s): PHART, HCO3 in the last 72 hours.  Invalid input(s): PCO2, PO2  Studies/Results: No results found.  Anti-infectives: Anti-infectives    Start     Dose/Rate Route Frequency Ordered Stop   03/14/15 1600  Ampicillin-Sulbactam (UNASYN) 3 g in sodium chloride 0.9 % 100 mL IVPB     3 g 100 mL/hr over 60 Minutes Intravenous STAT 03/14/15 1511 03/14/15 1700      Assessment/Plan: s/p Procedure(s): EXPLORATORY LAPAROTOMY APPENDECTOMY   Patient doing very well will advance diet and saline lock. We'll likely be able to go home tomorrow  Florene Glen, MD, FACS  03/17/2015

## 2015-03-18 MED ORDER — OXYCODONE-ACETAMINOPHEN 5-325 MG PO TABS: 1.0000 | ORAL_TABLET | ORAL | Status: DC | PRN

## 2015-03-18 MED ORDER — FAMOTIDINE 20 MG PO TABS
20.0000 mg | ORAL_TABLET | Freq: Every day | ORAL | Status: DC
  Administered 2015-03-18: 20 mg via ORAL
  Filled 2015-03-18: qty 1

## 2015-03-18 NOTE — Discharge Summary (Signed)
Physician Discharge Summary  Patient ID: Paula Mills MRN: [id number] DOB/AGE: Sep 26, 1946 69 y.o.  Admit date: 03/11/2015 Discharge date: 03/18/2015   Discharge Diagnoses:  Principal Problem:   Bowel obstruction   Discharged Condition: stable  Procedures: Exploratory laparotomy and lysis of adhesions  Hospital Course: This a patient who was mated to the hospital with signs of bowel obstruction. She failed conservative management and ultimately required exploratory laparotomy and lysis of adhesions. She has made an uncomplicated postoperative recovery. She is tolerating a regular diet. She will be discharged in stable condition to follow-up in our office in 10 days with oral analgesia excess.  Consults: None  Significant Diagnostic Studies: CT scan   Disposition: Final discharge disposition not confirmed     Medication List    TAKE these medications        oxyCODONE-acetaminophen 5-325 MG per tablet  Commonly known as:  ROXICET  Take 1 tablet by mouth every 4 (four) hours as needed for moderate pain.           Follow-up Information    Schedule an appointment as soon as possible for a visit with Phoebe Perch, MD.   Specialties:  Surgery, Radiology   Why:  For wound re-check   Contact information:   [place] [address] [phone]       Florene Glen, MD, FACS

## 2015-03-18 NOTE — Care Management (Signed)
Notified by previous RNCM that patient may need home health and a rolling walker. I met again with patient and her husband to offer home health- patient again denies needs. Patient discharging home today. No further RNCM needs.

## 2015-03-18 NOTE — Progress Notes (Signed)
4 Days Post-Op  Subjective: Postop lysis of adhesions for small bowel obstruction. Feels much better and is tolerating a diet. Wants to go home.  Objective: Vital signs in last 24 hours: Temp:  [98.3 F (36.8 C)-98.5 F (36.9 C)] 98.5 F (36.9 C) (06/15 0817) Pulse Rate:  [87-103] 93 (06/15 0817) Resp:  [16-18] 18 (06/15 0817) BP: (155-173)/(82-89) 173/89 mmHg (06/15 0817) SpO2:  [95 %-99 %] 95 % (06/15 0817) Weight:  [76.567 kg (168 lb 12.8 oz)] 76.567 kg (168 lb 12.8 oz) (06/15 0622) Last BM Date: 03/13/15  Intake/Output from previous day: 06/14 0701 - 06/15 0700 In: 2353.1 [P.O.:360; I.V.:1993.1] Out: 150 [Urine:150] Intake/Output this shift:    Physical exam:  Wound is clean soft nontender nondistended abdomen. Nontender calves  Lab Results: CBC   Recent Labs  03/16/15 0445  PLT 159   BMET No results for input(s): NA, K, CL, CO2, GLUCOSE, BUN, CREATININE, CALCIUM in the last 72 hours. PT/INR No results for input(s): LABPROT, INR in the last 72 hours. ABG No results for input(s): PHART, HCO3 in the last 72 hours.  Invalid input(s): PCO2, PO2  Studies/Results: No results found.  Anti-infectives: Anti-infectives    Start     Dose/Rate Route Frequency Ordered Stop   03/14/15 1600  Ampicillin-Sulbactam (UNASYN) 3 g in sodium chloride 0.9 % 100 mL IVPB     3 g 100 mL/hr over 60 Minutes Intravenous STAT 03/14/15 1511 03/14/15 1700      Assessment/Plan: s/p Procedure(s): EXPLORATORY LAPAROTOMY APPENDECTOMY   Patient doing very well diet advancing and bowels functioning. Patient desires discharged today I will arrange for discharge and follow-up with oral analgesia excess as prescribed.Florene Glen, MD, FACS  03/18/2015

## 2015-03-18 NOTE — Discharge Instructions (Addendum)
Remove dressings and shower in 24 hours. Regular diet. Follow-up with Dr. Burt Knack in 10 days. Call with questions or concerns.  Inspect your incision and contact surgeon's office if you notice any increased redness, swelling, bright red bleeding or foul smelling drainage.  Also if you are experiencing abdominal pain that does not improve after taking the prescribed pain medication, any nausea and vomiting a fever of 100.5 or greater.

## 2015-03-23 ENCOUNTER — Telehealth: Payer: Self-pay | Admitting: Surgery

## 2015-03-23 NOTE — Telephone Encounter (Signed)
Spoke with patient and answered questions regarding showering, bandages, and food.   Denies any further questions at this time.  Encouraged to call back with questions or concerns.

## 2015-03-23 NOTE — Telephone Encounter (Signed)
Pt wants to know if she can take a shower and take bandages off? Questions about food also

## 2015-03-26 ENCOUNTER — Ambulatory Visit (INDEPENDENT_AMBULATORY_CARE_PROVIDER_SITE_OTHER): Payer: Medicare Other | Admitting: Surgery

## 2015-03-26 ENCOUNTER — Ambulatory Visit: Payer: Medicare Other | Admitting: Surgery

## 2015-03-26 ENCOUNTER — Encounter: Payer: Self-pay | Admitting: Surgery

## 2015-03-26 VITALS — BP 151/81 | HR 74 | Temp 98.1°F | Ht 62.2 in | Wt 162.8 lb

## 2015-03-26 DIAGNOSIS — K565 Intestinal adhesions [bands], unspecified as to partial versus complete obstruction: Secondary | ICD-10-CM

## 2015-03-26 NOTE — Progress Notes (Deleted)
Subjective:     Patient ID: Paula Mills, female   DOB: 15-Mar-1946, 69 y.o.   MRN: [id number]  HPI   Review of Systems     Objective:   Physical Exam     Assessment:     ***    Plan:     ***

## 2015-03-26 NOTE — Progress Notes (Signed)
S: Doing well.  Tolerating diet.  Having good BM.  No pain meds.  Still with gas pain.     O:  Blood pressure 151/81, pulse 74, temperature 98.1 F (36.7 C), temperature source Oral, height 5' 2.2" (1.58 m), weight 162 lb 12.8 oz (73.846 kg). ABD: soft, min tender, nondistended, incision c/d/i  A/P 69 yo s/p ex lap, LOA.  Doing well.  F/u in 3 weeks to ensure progression.

## 2015-03-26 NOTE — Patient Instructions (Signed)
You may resume walking and light housework immediately but follow with what your body will allow you to do. If you become sore, back off and try again in several days.  Get lots of rest and try not to overdo it but try to be as independent as possible with activities of daily life.  Follow-up with our office in 3 weeks in our [place] office with Dr. Rexene Edison.

## 2015-04-13 ENCOUNTER — Encounter: Payer: Medicare Other | Admitting: Surgery

## 2015-04-15 ENCOUNTER — Ambulatory Visit (INDEPENDENT_AMBULATORY_CARE_PROVIDER_SITE_OTHER): Payer: Medicare Other | Admitting: Surgery

## 2015-04-15 ENCOUNTER — Encounter: Payer: Self-pay | Admitting: Surgery

## 2015-04-15 VITALS — BP 155/92 | HR 90 | Temp 97.9°F | Resp 18 | Ht 62.5 in | Wt 162.0 lb

## 2015-04-15 DIAGNOSIS — Z09 Encounter for follow-up examination after completed treatment for conditions other than malignant neoplasm: Secondary | ICD-10-CM

## 2015-04-15 NOTE — Progress Notes (Signed)
Surgery Progress Note  S: No acute issues.  Pain improved.  Tolerating diet.  Having good BM O: Blood pressure 155/92, pulse 90, temperature 97.9 F (36.6 C), temperature source Oral, resp. rate 18, height 5' 2.5" (1.588 m), weight 73.483 kg (162 lb). GEN: NAD/A&Ox3 ABD: soft, min tender, nondistended, incision c/d/i, no hernias  A/P 69 yo s/p ex lap, LOA, appendectomy, doing well - f/u prn - no heavy lifting x 1 more week

## 2015-04-15 NOTE — Patient Instructions (Signed)
Do not drive on pain medications Do not lift greater than 15 lbs for a period of 6 weeks Call or return to ER if you develop fever greater than 101.5, nausea/vomiting, increased pain, redness/drainage from incisions

## 2016-02-09 ENCOUNTER — Encounter: Payer: Self-pay | Admitting: Family Medicine

## 2016-02-09 ENCOUNTER — Ambulatory Visit (INDEPENDENT_AMBULATORY_CARE_PROVIDER_SITE_OTHER): Payer: Medicare Other | Admitting: Family Medicine

## 2016-02-09 VITALS — BP 116/78 | HR 72 | Temp 97.3°F | Resp 16 | Ht 63.0 in | Wt 160.6 lb

## 2016-02-09 DIAGNOSIS — Z0000 Encounter for general adult medical examination without abnormal findings: Secondary | ICD-10-CM

## 2016-02-09 DIAGNOSIS — E785 Hyperlipidemia, unspecified: Secondary | ICD-10-CM | POA: Insufficient documentation

## 2016-02-09 DIAGNOSIS — Z23 Encounter for immunization: Secondary | ICD-10-CM

## 2016-02-09 DIAGNOSIS — M810 Age-related osteoporosis without current pathological fracture: Secondary | ICD-10-CM | POA: Diagnosis not present

## 2016-02-09 DIAGNOSIS — Z789 Other specified health status: Secondary | ICD-10-CM

## 2016-02-09 DIAGNOSIS — L820 Inflamed seborrheic keratosis: Secondary | ICD-10-CM | POA: Diagnosis not present

## 2016-02-09 DIAGNOSIS — R7989 Other specified abnormal findings of blood chemistry: Secondary | ICD-10-CM

## 2016-02-09 DIAGNOSIS — E559 Vitamin D deficiency, unspecified: Secondary | ICD-10-CM

## 2016-02-09 DIAGNOSIS — L989 Disorder of the skin and subcutaneous tissue, unspecified: Secondary | ICD-10-CM

## 2016-02-09 DIAGNOSIS — I700 Atherosclerosis of aorta: Secondary | ICD-10-CM | POA: Insufficient documentation

## 2016-02-09 DIAGNOSIS — K7689 Other specified diseases of liver: Secondary | ICD-10-CM | POA: Insufficient documentation

## 2016-02-09 DIAGNOSIS — M47816 Spondylosis without myelopathy or radiculopathy, lumbar region: Secondary | ICD-10-CM | POA: Insufficient documentation

## 2016-02-09 DIAGNOSIS — K449 Diaphragmatic hernia without obstruction or gangrene: Secondary | ICD-10-CM | POA: Insufficient documentation

## 2016-02-09 MED ORDER — LIDOCAINE HCL (PF) 1 % IJ SOLN
2.0000 mL | Freq: Once | INTRAMUSCULAR | Status: AC
  Administered 2016-02-09: 2 mL via INTRADERMAL

## 2016-02-09 NOTE — Progress Notes (Signed)
Name: Paula Mills   MRN: [id number]    DOB: [birth year]   Date:02/09/2016       Progress Note  Subjective  Chief Complaint  Chief Complaint  Patient presents with  . Medicare Wellness    HPI  Functional ability/safety issues: No Issues Hearing issues: Addressed  Activities of daily living: Discussed Home safety issues: No Issues  End Of Life Planning: Offered verbal information regarding advanced directives, healthcare power of attorney. Husband and also Ovid Curd oldest son  Preventative care, Health maintenance, Preventative health measures discussed.  Preventative screenings discussed today: lab work, colonoscopy - refuses this year - considering having it done in 2018   Mammogram every other year, DEXA  Showed osteoporosis last year but does not want to take medication  Low Dose CT Chest recommended if Age 29-80 years, 30 pack-year currently smoking OR have quit w/in 15years.   Lifestyle risk factor issued reviewed: Diet, exercise, weight management, advised patient smoking is not healthy, nutrition/diet.  Preventative health measures discussed (5-10 year plan).  Reviewed and recommended vaccinations: - Pneumovax -refused - Prevnar -refused - Annual Influenza - Zostavax -refused - Tdap   Depression screening: Done Fall risk screening: Done Discuss ADLs/IADLs: Done  Current medical providers: See HPI  Other health risk factors identified this visit: No other issues Cognitive impairment issues: None identified  All above discussed with patient. Appropriate education, counseling and referral will be made based upon the above.   Calcification of abdominal aorta: found on CT abdomen, she also has dyslipidemia but refuses statin therapy . She denies chest pain, no palpitation, no decrease in exercise tolerance.   Patient Active Problem List   Diagnosis Date Noted  . Hiatal hernia 02/09/2016  . Liver cyst 02/09/2016  . Lumbar spondylosis 02/09/2016  . Osteoporosis  02/09/2016  . Dyslipidemia 02/09/2016  . Vegetarian 02/09/2016  . Vitamin D deficiency 02/09/2016  . Calcification of abdominal aorta (HCC) 02/09/2016  . History of small bowel obstruction 03/12/2015    Past Surgical History  Procedure Laterality Date  . Abdominal hysterectomy    . Cesarean section    . Laparotomy N/A 03/14/2015    Procedure: EXPLORATORY LAPAROTOMY;  Surgeon: Molly Maduro, MD;  Location: [facility] ORS;  Service: General;  Laterality: N/A;  . Appendectomy N/A 03/14/2015    Procedure: APPENDECTOMY;  Surgeon: Molly Maduro, MD;  Location: [facility] ORS;  Service: General;  Laterality: N/A;    Family History  Problem Relation Age of Onset  . Hyperlipidemia Mother   . Hypertension Mother   . Multiple sclerosis Father   . Multiple sclerosis Sister     Social History   Social History  . Marital Status: Married    Spouse Name: N/A  . Number of Children: N/A  . Years of Education: N/A   Occupational History  . Not on file.   Social History Main Topics  . Smoking status: Never Smoker   . Smokeless tobacco: Never Used  . Alcohol Use: No  . Drug Use: No  . Sexual Activity: Not on file   Other Topics Concern  . Not on file   Social History Narrative     Current outpatient prescriptions:  Marland Kitchen  Multiple Vitamins-Minerals (WOMENS MULTIVITAMIN PO), Take by mouth., Disp: , Rfl:   No Known Allergies   ROS  Constitutional: Negative for fever or weight change.  Respiratory: Negative for cough and shortness of breath.   Cardiovascular: Negative for chest pain or palpitations.  Gastrointestinal: Negative for abdominal pain, no bowel  changes.  Musculoskeletal: Negative for gait problem or joint swelling.  Skin: Negative for rash.  Neurological: Negative for dizziness or headache.  No other specific complaints in a complete review of systems (except as listed in HPI above).  Objective  Filed Vitals:   02/09/16 1102  BP: 116/78  Pulse: 72  Temp: 97.3 F (36.3  C)  TempSrc: Oral  Resp: 16  Height: 5\' 3"  (1.6 m)  Weight: 160 lb 9.6 oz (72.848 kg)  SpO2: 96%    Body mass index is 28.46 kg/(m^2).  Physical Exam  Constitutional: Patient appears well-developed and well-nourished. No distress.  HENT: Head: Normocephalic and atraumatic. Ears: B TMs ok, no erythema or effusion; Nose: Nose normal. Mouth/Throat: Oropharynx is clear and moist. No oropharyngeal exudate.  Eyes: Conjunctivae and EOM are normal. Pupils are equal, round, and reactive to light. No scleral icterus.  Neck: Normal range of motion. Neck supple. No JVD present. No thyromegaly present.  Cardiovascular: Normal rate, regular rhythm and normal heart sounds.  No murmur heard. No BLE edema. Pulmonary/Chest: Effort normal and breath sounds normal. No respiratory distress. Abdominal: Soft. Bowel sounds are normal, no distension. There is no tenderness. no masses Breast: no lumps or masses, no nipple discharge or rashes FEMALE GENITALIA:  Not done RECTAL: not done Musculoskeletal: Normal range of motion, no joint effusions. No gross deformities Neurological: he is alert and oriented to person, place, and time. No cranial nerve deficit. Coordination, balance, strength, speech and gait are normal.  Skin: Skin is warm and dry. Dry oval lesion, gray in color on right outer lower leg  Psychiatric: Patient has a normal mood and affect. behavior is normal. Judgment and thought content normal.  PHQ2/9: Depression screen PHQ 2/9 02/09/2016  Decreased Interest 0  Down, Depressed, Hopeless 0  PHQ - 2 Score 0    Fall Risk: Fall Risk  02/09/2016  Falls in the past year? No     Functional Status Survey: Is the patient deaf or have difficulty hearing?: No Does the patient have difficulty seeing, even when wearing glasses/contacts?: No Does the patient have difficulty concentrating, remembering, or making decisions?: No Does the patient have difficulty walking or climbing stairs?: No Does the  patient have difficulty dressing or bathing?: No Does the patient have difficulty doing errands alone such as visiting a doctor's office or shopping?: No   Assessment & Plan  1. Medicare annual wellness visit, subsequent  Discussed importance of 150 minutes of physical activity weekly, eat two servings of fish weekly, eat one serving of tree nuts ( cashews, pistachios, pecans, almonds.Marland Kitchen) every other day, eat 6 servings of fruit/vegetables daily and drink plenty of water and avoid sweet beverages.   2. Osteoporosis  - Comprehensive metabolic panel  3. Dyslipidemia  - Lipid panel  4. Vegetarian  - VITAMIN D 25 Hydroxy (Vit-D Deficiency, Fractures) - Comprehensive metabolic panel  5. Vitamin D deficiency  - VITAMIN D 25 Hydroxy (Vit-D Deficiency, Fractures)  6. Calcification of abdominal aorta (HCC)  She refuses statin therapy   7. Need for Zostavax administration  - Varicella-zoster vaccine subcutaneous -refused  8. Need for pneumococcal vaccination  - Pneumococcal polysaccharide vaccine 23-valent greater than or equal to 2yo subcutaneous/IM -refused  9. Low serum calcium  - Comprehensive metabolic panel  10. Skin lesion of right leg  Consent signed: YES  Procedure: Skin lesion  Removal Location: right outer lower leg  Equipment used: derma-blade, high temperature cautery, sterile scalpel, tweezers, curved scissors Anesthesia: 1% Lidocaine w/o  Epinephrine  Cleaned and prepped: Betadine  After consent signed, are of skin prepped with betadine. Lidocaine w/o epinephrine injected into skin underneath skin lesion. After properly numbed sterile equipment used to remove tag.  Specimen sent for pathology analysis. Instructed on proper care to allow for proper healing. F/U for nursing visit if needed.   - Dermatology pathology

## 2016-02-09 NOTE — Patient Instructions (Signed)
  Ms. Buttry , Thank you for taking time to come for your Medicare Wellness Visit. I appreciate your ongoing commitment to your health goals. Please review the following plan we discussed and let me know if I can assist you in the future.   These are the goals we discussed:  Continue physical activity Consider colonoscopy    This is a list of the screening recommended for you and due dates:  Health Maintenance  Topic Date Due  . Colon Cancer Screening  06/07/2014  . Pneumonia vaccines (1 of 2 - PCV13) 09/26/2019*  . Shingles Vaccine  08/30/2029*  . Flu Shot  05/03/2016  . Tetanus Vaccine  10/03/2016  . Mammogram  01/06/2017  . DEXA scan (bone density measurement)  Completed  .  Hepatitis C: One time screening is recommended by Center for Disease Control  (CDC) for  adults born from [birth year] through 1966.   Completed  *Topic was postponed. The date shown is not the original due date.

## 2016-02-10 LAB — LIPID PANEL
CHOL/HDL RATIO: 4 ratio (ref 0.0–4.4)
Cholesterol, Total: 333 mg/dL — ABNORMAL HIGH (ref 100–199)
HDL: 84 mg/dL (ref >39)
LDL CALC: 234 mg/dL — AB (ref 0–99)
TRIGLYCERIDES: 77 mg/dL (ref 0–149)
VLDL Cholesterol Cal: 15 mg/dL (ref 5–40)

## 2016-02-10 LAB — COMPREHENSIVE METABOLIC PANEL
ALBUMIN: 4.4 g/dL (ref 3.5–4.8)
ALK PHOS: 95 IU/L (ref 39–117)
ALT: 16 IU/L (ref 0–32)
AST: 21 IU/L (ref 0–40)
Albumin/Globulin Ratio: 1.3 (ref 1.2–2.2)
BUN / CREAT RATIO: 11 — AB (ref 12–28)
BUN: 7 mg/dL — AB (ref 8–27)
Bilirubin Total: 0.4 mg/dL (ref 0.0–1.2)
CHLORIDE: 100 mmol/L (ref 96–106)
CO2: 25 mmol/L (ref 18–29)
CREATININE: 0.63 mg/dL (ref 0.57–1.00)
Calcium: 9.5 mg/dL (ref 8.7–10.3)
GFR calc Af Amer: 105 mL/min/{1.73_m2} (ref >59)
GFR calc non Af Amer: 91 mL/min/{1.73_m2} (ref >59)
GLUCOSE: 85 mg/dL (ref 65–99)
Globulin, Total: 3.3 g/dL (ref 1.5–4.5)
Potassium: 4.1 mmol/L (ref 3.5–5.2)
Sodium: 142 mmol/L (ref 134–144)
TOTAL PROTEIN: 7.7 g/dL (ref 6.0–8.5)

## 2016-02-10 LAB — VITAMIN D 25 HYDROXY (VIT D DEFICIENCY, FRACTURES): VIT D 25 HYDROXY: 29.3 ng/mL — AB (ref 30.0–100.0)

## 2016-04-08 ENCOUNTER — Encounter: Payer: Self-pay | Admitting: Sports Medicine

## 2016-04-08 ENCOUNTER — Ambulatory Visit (INDEPENDENT_AMBULATORY_CARE_PROVIDER_SITE_OTHER): Payer: Medicare Other | Admitting: Sports Medicine

## 2016-04-08 DIAGNOSIS — M79673 Pain in unspecified foot: Secondary | ICD-10-CM

## 2016-04-08 DIAGNOSIS — O2690 Pregnancy related conditions, unspecified, unspecified trimester: Secondary | ICD-10-CM | POA: Insufficient documentation

## 2016-04-08 DIAGNOSIS — M21619 Bunion of unspecified foot: Secondary | ICD-10-CM | POA: Diagnosis not present

## 2016-04-08 DIAGNOSIS — Z1159 Encounter for screening for other viral diseases: Secondary | ICD-10-CM | POA: Insufficient documentation

## 2016-04-08 DIAGNOSIS — Z719 Counseling, unspecified: Secondary | ICD-10-CM | POA: Insufficient documentation

## 2016-04-08 DIAGNOSIS — Z1389 Encounter for screening for other disorder: Secondary | ICD-10-CM | POA: Insufficient documentation

## 2016-04-08 DIAGNOSIS — Z0000 Encounter for general adult medical examination without abnormal findings: Secondary | ICD-10-CM | POA: Insufficient documentation

## 2016-04-08 DIAGNOSIS — Z1211 Encounter for screening for malignant neoplasm of colon: Secondary | ICD-10-CM | POA: Insufficient documentation

## 2016-04-08 DIAGNOSIS — I8390 Asymptomatic varicose veins of unspecified lower extremity: Secondary | ICD-10-CM | POA: Insufficient documentation

## 2016-04-08 DIAGNOSIS — M2040 Other hammer toe(s) (acquired), unspecified foot: Secondary | ICD-10-CM | POA: Diagnosis not present

## 2016-04-08 DIAGNOSIS — Z9181 History of falling: Secondary | ICD-10-CM | POA: Insufficient documentation

## 2016-04-08 DIAGNOSIS — E2839 Other primary ovarian failure: Secondary | ICD-10-CM | POA: Insufficient documentation

## 2016-04-08 DIAGNOSIS — O039 Complete or unspecified spontaneous abortion without complication: Secondary | ICD-10-CM | POA: Insufficient documentation

## 2016-04-08 DIAGNOSIS — Z8679 Personal history of other diseases of the circulatory system: Secondary | ICD-10-CM | POA: Insufficient documentation

## 2016-04-08 DIAGNOSIS — E559 Vitamin D deficiency, unspecified: Secondary | ICD-10-CM | POA: Insufficient documentation

## 2016-04-08 DIAGNOSIS — Q828 Other specified congenital malformations of skin: Secondary | ICD-10-CM | POA: Diagnosis not present

## 2016-04-08 DIAGNOSIS — Z87898 Personal history of other specified conditions: Secondary | ICD-10-CM | POA: Insufficient documentation

## 2016-04-08 DIAGNOSIS — Z1239 Encounter for other screening for malignant neoplasm of breast: Secondary | ICD-10-CM | POA: Insufficient documentation

## 2016-04-08 DIAGNOSIS — IMO0002 Reserved for concepts with insufficient information to code with codable children: Secondary | ICD-10-CM | POA: Insufficient documentation

## 2016-04-08 DIAGNOSIS — Z124 Encounter for screening for malignant neoplasm of cervix: Secondary | ICD-10-CM | POA: Insufficient documentation

## 2016-04-08 NOTE — Patient Instructions (Signed)

## 2016-04-09 NOTE — Progress Notes (Signed)
Patient ID: Paula Mills, female   DOB: [birth year], 70 y.o.   MRN: [id number] Subjective: Paula Mills is a 70 y.o. female patient who presents to office for evaluation of Right foot pain secondary to callus skin and for evaluation of bunions; desires to discuss treatment options. Patient complains of pain at the lesion present Right foot at the ball. Patient states that she wasn't sure what it was so has not tried anything for it. Admits to bunions getting larger and 1 episode of irritation after wearing clog shoes but got better after stopped wearing them. Patient denies any other pedal complaints.   Admits to a family history of bunions.  Patient Active Problem List   Diagnosis Date Noted  . Abortion 04/08/2016  . At risk for falling 04/08/2016  . Encounter for other screening for malignant neoplasm of breast 04/08/2016  . Cervical cancer screening 04/08/2016  . Encounter for screening for malignant neoplasm of colon 04/08/2016  . Gravida 1 04/08/2016  . Encounter for counseling 04/08/2016  . H/O: HTN (hypertension) 04/08/2016  . Routine general medical examination at a health care facility 04/08/2016  . Hypo-ovarianism 04/08/2016  . Parity 1 04/08/2016  . Pregnancy, complications of [id number]  . Encounter for screening for other disorder 04/08/2016  . Encounter for screening for other viral diseases 04/08/2016  . Encounter for general adult medical examination without abnormal findings 04/08/2016  . Term birth of infant 04/08/2016  . Asymptomatic varicose veins 04/08/2016  . Avitaminosis D 04/08/2016  . Hiatal hernia 02/09/2016  . Liver cyst 02/09/2016  . Lumbar spondylosis 02/09/2016  . Osteoporosis 02/09/2016  . Dyslipidemia 02/09/2016  . Vegetarian 02/09/2016  . Vitamin D deficiency 02/09/2016  . Calcification of abdominal aorta (HCC) 02/09/2016  . History of small bowel obstruction 03/12/2015    Current Outpatient Prescriptions on File Prior to Visit  Medication Sig  Dispense Refill  . Multiple Vitamins-Minerals (WOMENS MULTIVITAMIN PO) Take by mouth.     No current facility-administered medications on file prior to visit.    No Known Allergies  Objective:  General: Alert and oriented x3 in no acute distress  Dermatology: Keratotic lesion present plantar 1st interspace right foot with skin lines transversing the lesion, pain is present with direct pressure to the lesion with a central nucleated core noted consistent with porokeratosis, no webspace macerations, no ecchymosis bilateral, all nails x 10 are well manicured.  Vascular: Dorsalis Pedis 2/4 and Posterior Tibial pedal pulses 1/4, Capillary Fill Time 3 seconds, + pedal hair growth bilateral, no edema bilateral lower extremities, Temperature gradient within normal limits.  Neurology: Johney Maine sensation intact via light touch bilateral.  Musculoskeletal: Mild tenderness with palpation at the keratotic lesion site on Right, Muscular strength 5/5 in all groups without pain or limitation on range of motion. There is grade 4 clinical bunion present with hallux pronation and crossover hammertoe deformity noted bilateral.   Assessment and Plan: Problem List Items Addressed This Visit    None    Visit Diagnoses    Porokeratosis    -  Primary    Bunion        Hammer toe, unspecified laterality        Foot pain, unspecified laterality          -Complete examination performed -Discussed treatment options for porokeratosis and bunion with hammertoe -Patient reports that she does not want surgery; wants to discuss cause and how to prevent her bunions from worsening; admits that she does not have pain to  bunions but does have pain at keratosis -Advised patient on wider good supportive shoes and inserts and gave patient spacers and bunion pads -Parred keratoic lesion using a chisel blade; treated the area withSalinocaine covered with moleskin to keep intact for 1 day -Encouraged daily skin  emollients -Encouraged use of pumice stone -Patient to return to office as needed or sooner if condition worsens.  Landis Martins, DPM

## 2016-10-05 ENCOUNTER — Ambulatory Visit (INDEPENDENT_AMBULATORY_CARE_PROVIDER_SITE_OTHER): Payer: Medicare Other | Admitting: Family Medicine

## 2016-10-05 ENCOUNTER — Encounter: Payer: Self-pay | Admitting: Family Medicine

## 2016-10-05 VITALS — BP 124/84 | HR 94 | Temp 97.7°F | Resp 16 | Ht 63.0 in | Wt 164.0 lb

## 2016-10-05 DIAGNOSIS — M25561 Pain in right knee: Secondary | ICD-10-CM

## 2016-10-05 DIAGNOSIS — M79661 Pain in right lower leg: Secondary | ICD-10-CM | POA: Diagnosis not present

## 2016-10-05 MED ORDER — MELOXICAM 15 MG PO TABS: 15.0000 mg | ORAL_TABLET | Freq: Every day | ORAL | 0 refills | Status: DC

## 2016-10-05 NOTE — Progress Notes (Signed)
Name: Paula Mills   MRN: [id number]    DOB: 05-Dec-1945   Date:10/05/2016       Progress Note  Subjective  Chief Complaint  Chief Complaint  Patient presents with  . Knee Pain    for about 2 months rt knee thinks she did something during exercise  . Flu Vaccine    pt refused    HPI  Knee pain/leg pain: she states that about 2 months ago she noticed acute pain on right medial knee and right lower lateral leg. She is always very active - takes spin classes, water aerobics, body pump plus silver sneakers classes. She states the night she developed the pain was after spin class and she stopped doing that. She states pain got worse and had some right knee effusion so she took Ibuprofen for a few days with improvement of symptoms. She states her legs are stiff ( hips and knees ) when she first gets up in am, but gets better within 10 minutes. Mother has OA.   Patient Active Problem List   Diagnosis Date Noted  . Abortion 04/08/2016  . At risk for falling 04/08/2016  . Encounter for other screening for malignant neoplasm of breast 04/08/2016  . Cervical cancer screening 04/08/2016  . Encounter for screening for malignant neoplasm of colon 04/08/2016  . Gravida 1 04/08/2016  . Encounter for counseling 04/08/2016  . H/O: HTN (hypertension) 04/08/2016  . Routine general medical examination at a health care facility 04/08/2016  . Hypo-ovarianism 04/08/2016  . Parity 1 04/08/2016  . Pregnancy, complications of [id number]  . Encounter for screening for other disorder 04/08/2016  . Encounter for screening for other viral diseases 04/08/2016  . Encounter for general adult medical examination without abnormal findings 04/08/2016  . Term birth of infant 04/08/2016  . Asymptomatic varicose veins 04/08/2016  . Avitaminosis D 04/08/2016  . Hiatal hernia 02/09/2016  . Liver cyst 02/09/2016  . Lumbar spondylosis 02/09/2016  . Osteoporosis 02/09/2016  . Dyslipidemia 02/09/2016  . Vegetarian  02/09/2016  . Vitamin D deficiency 02/09/2016  . Calcification of abdominal aorta (HCC) 02/09/2016  . History of small bowel obstruction 03/12/2015    Past Surgical History:  Procedure Laterality Date  . ABDOMINAL HYSTERECTOMY    . APPENDECTOMY N/A 03/14/2015   Procedure: APPENDECTOMY;  Surgeon: Molly Maduro, MD;  Location: [facility] ORS;  Service: General;  Laterality: N/A;  . CESAREAN SECTION    . LAPAROTOMY N/A 03/14/2015   Procedure: EXPLORATORY LAPAROTOMY;  Surgeon: Molly Maduro, MD;  Location: [facility] ORS;  Service: General;  Laterality: N/A;    Family History  Problem Relation Age of Onset  . Hyperlipidemia Mother   . Hypertension Mother   . Multiple sclerosis Father   . Multiple sclerosis Sister     Social History   Social History  . Marital status: Married    Spouse name: N/A  . Number of children: N/A  . Years of education: N/A   Occupational History  . Not on file.   Social History Main Topics  . Smoking status: Never Smoker  . Smokeless tobacco: Never Used  . Alcohol use No  . Drug use: No  . Sexual activity: Not on file   Other Topics Concern  . Not on file   Social History Narrative  . No narrative on file     Current Outpatient Prescriptions:  .  aspirin 81 MG tablet, Take by mouth., Disp: , Rfl:  .  Calcium Carbonate-Vitamin D (CALCIUM 600+D) 600-400  MG-UNIT tablet, Take by mouth., Disp: , Rfl:  .  Cholecalciferol (VITAMIN D) 2000 units CAPS, Take by mouth., Disp: , Rfl:  .  Multiple Vitamins-Minerals (WOMENS MULTIVITAMIN PO), Take by mouth., Disp: , Rfl:   No Known Allergies   ROS  Constitutional: Negative for fever or weight change.  Respiratory: Negative for cough and shortness of breath.   Cardiovascular: Negative for chest pain or palpitations.  Gastrointestinal: Negative for abdominal pain, no bowel changes.  Musculoskeletal: Positive for gait problem no  joint swelling.  Skin: Negative for rash.  Neurological: Negative for  dizziness or headache.  No other specific complaints in a complete review of systems (except as listed in HPI above).  Objective  Vitals:   10/05/16 1535  BP: 124/84  Pulse: 94  Resp: 16  Temp: 97.7 F (36.5 C)  SpO2: 93%  Weight: 164 lb (74.4 kg)  Height: 5\' 3"  (1.6 m)    Body mass index is 29.05 kg/m.  Physical Exam  Constitutional: Patient appears well-developed and well-nourished. Obese No distress.  HEENT: head atraumatic, normocephalic, pupils equal and reactive to light,  neck supple, throat within normal limits Cardiovascular: Normal rate, regular rhythm and normal heart sounds.  No murmur heard. No BLE edema. Pulmonary/Chest: Effort normal and breath sounds normal. No respiratory distress. Abdominal: Soft.  There is no tenderness. Psychiatric: Patient has a normal mood and affect. behavior is normal. Judgment and thought content normal. Muscular Skeletal: she has medial right knee pain, normal rom, normal popliteal fossa, no effusion at this time, pain during palpation of right lateral lower leg  PHQ2/9: Depression screen [facility] 2/9 02/09/2016  Decreased Interest 0  Down, Depressed, Hopeless 0  PHQ - 2 Score 0     Fall Risk: Fall Risk  02/09/2016  Falls in the past year? No     Assessment & Plan    1. Right medial knee pain  - meloxicam (MOBIC) 15 MG tablet; Take 1 tablet (15 mg total) by mouth daily.  Dispense: 30 tablet; Refill: 0 - Ambulatory referral to Orthopedic Surgery  2. Pain in right lower leg  - meloxicam (MOBIC) 15 MG tablet; Take 1 tablet (15 mg total) by mouth daily.  Dispense: 30 tablet; Refill: 0 - Ambulatory referral to Orthopedic Surgery

## 2016-10-14 ENCOUNTER — Emergency Department
Admission: EM | Admit: 2016-10-14 | Discharge: 2016-10-14 | Disposition: A | Discharge status: Home / Self Care | Payer: Medicare Other | Attending: Emergency Medicine | Admitting: Emergency Medicine

## 2016-10-14 ENCOUNTER — Emergency Department: Payer: Medicare Other

## 2016-10-14 ENCOUNTER — Encounter: Payer: Self-pay | Admitting: Emergency Medicine

## 2016-10-14 DIAGNOSIS — Z7982 Long term (current) use of aspirin: Secondary | ICD-10-CM | POA: Insufficient documentation

## 2016-10-14 DIAGNOSIS — R42 Dizziness and giddiness: Secondary | ICD-10-CM | POA: Insufficient documentation

## 2016-10-14 DIAGNOSIS — I10 Essential (primary) hypertension: Secondary | ICD-10-CM | POA: Insufficient documentation

## 2016-10-14 DIAGNOSIS — Z79899 Other long term (current) drug therapy: Secondary | ICD-10-CM | POA: Diagnosis not present

## 2016-10-14 LAB — URINALYSIS, COMPLETE (UACMP) WITH MICROSCOPIC
BACTERIA UA: NONE SEEN
BILIRUBIN URINE: NEGATIVE
GLUCOSE, UA: NEGATIVE mg/dL
HGB URINE DIPSTICK: NEGATIVE
Ketones, ur: 5 mg/dL — AB
NITRITE: NEGATIVE
PROTEIN: 30 mg/dL — AB
RBC / HPF: NONE SEEN RBC/hpf (ref 0–5)
SQUAMOUS EPITHELIAL / LPF: NONE SEEN
Specific Gravity, Urine: 1.012 (ref 1.005–1.030)
pH: 7 (ref 5.0–8.0)

## 2016-10-14 LAB — COMPREHENSIVE METABOLIC PANEL
ALBUMIN: 3.9 g/dL (ref 3.5–5.0)
ALT: 19 U/L (ref 14–54)
AST: 24 U/L (ref 15–41)
Alkaline Phosphatase: 83 U/L (ref 38–126)
Anion gap: 8 (ref 5–15)
BILIRUBIN TOTAL: 0.7 mg/dL (ref 0.3–1.2)
BUN: 7 mg/dL (ref 6–20)
CHLORIDE: 105 mmol/L (ref 101–111)
CO2: 25 mmol/L (ref 22–32)
Calcium: 9.1 mg/dL (ref 8.9–10.3)
Creatinine, Ser: 0.47 mg/dL (ref 0.44–1.00)
GFR calc Af Amer: >60 mL/min (ref >60)
GFR calc non Af Amer: >60 mL/min (ref >60)
GLUCOSE: 134 mg/dL — AB (ref 65–99)
POTASSIUM: 3.5 mmol/L (ref 3.5–5.1)
Sodium: 138 mmol/L (ref 135–145)
Total Protein: 7.7 g/dL (ref 6.5–8.1)

## 2016-10-14 LAB — CBC
HEMATOCRIT: 40.9 % (ref 35.0–47.0)
HEMOGLOBIN: 13.5 g/dL (ref 12.0–16.0)
MCH: 27.3 pg (ref 26.0–34.0)
MCHC: 33.1 g/dL (ref 32.0–36.0)
MCV: 82.4 fL (ref 80.0–100.0)
Platelets: 249 10*3/uL (ref 150–440)
RBC: 4.96 MIL/uL (ref 3.80–5.20)
RDW: 14 % (ref 11.5–14.5)
WBC: 10 10*3/uL (ref 3.6–11.0)

## 2016-10-14 LAB — LIPASE, BLOOD: LIPASE: 19 U/L (ref 11–51)

## 2016-10-14 MED ORDER — LORAZEPAM 2 MG/ML IJ SOLN
0.5000 mg | Freq: Once | INTRAMUSCULAR | Status: AC
  Administered 2016-10-14: 0.5 mg via INTRAVENOUS
  Filled 2016-10-14: qty 1

## 2016-10-14 MED ORDER — MECLIZINE HCL 25 MG PO TABS: 25.0000 mg | ORAL_TABLET | Freq: Three times a day (TID) | ORAL | 0 refills | Status: DC | PRN

## 2016-10-14 MED ORDER — SODIUM CHLORIDE 0.9 % IV SOLN
1000.0000 mL | Freq: Once | INTRAVENOUS | Status: AC
  Administered 2016-10-14: 1000 mL via INTRAVENOUS

## 2016-10-14 MED ORDER — MECLIZINE HCL 25 MG PO TABS
25.0000 mg | ORAL_TABLET | Freq: Once | ORAL | Status: AC
  Administered 2016-10-14: 25 mg via ORAL
  Filled 2016-10-14 (×2): qty 1

## 2016-10-14 NOTE — ED Provider Notes (Signed)
[facility] Emergency Department Provider Note   ____________________________________________    I have reviewed the triage vital signs and the nursing notes.   HISTORY  Chief Complaint Emesis and Dizziness     HPI Paula Mills is a 71 y.o. female who presents with complaints of dizziness. Patient reports this morning she woke up with a feeling that the room was moving when it was not when she looked in certain directions. She denies headache. She denies neuro deficits. No fevers or chills. No tinnitus. She has never had this before. No recent travel. One episode of vomiting but no abdominal pain.   History reviewed. No pertinent past medical history.  Patient Active Problem List   Diagnosis Date Noted  . Abortion 04/08/2016  . At risk for falling 04/08/2016  . Encounter for other screening for malignant neoplasm of breast 04/08/2016  . Cervical cancer screening 04/08/2016  . Encounter for screening for malignant neoplasm of colon 04/08/2016  . Gravida 1 04/08/2016  . Encounter for counseling 04/08/2016  . H/O: HTN (hypertension) 04/08/2016  . Routine general medical examination at a health care facility 04/08/2016  . Hypo-ovarianism 04/08/2016  . Parity 1 04/08/2016  . Pregnancy, complications of [id number]  . Encounter for screening for other disorder 04/08/2016  . Encounter for screening for other viral diseases 04/08/2016  . Encounter for general adult medical examination without abnormal findings 04/08/2016  . Term birth of infant 04/08/2016  . Asymptomatic varicose veins 04/08/2016  . Avitaminosis D 04/08/2016  . Hiatal hernia 02/09/2016  . Liver cyst 02/09/2016  . Lumbar spondylosis 02/09/2016  . Osteoporosis 02/09/2016  . Dyslipidemia 02/09/2016  . Vegetarian 02/09/2016  . Vitamin D deficiency 02/09/2016  . Calcification of abdominal aorta (HCC) 02/09/2016  . History of small bowel obstruction 03/12/2015    Past Surgical  History:  Procedure Laterality Date  . ABDOMINAL HYSTERECTOMY    . APPENDECTOMY N/A 03/14/2015   Procedure: APPENDECTOMY;  Surgeon: Molly Maduro, MD;  Location: [facility] ORS;  Service: General;  Laterality: N/A;  . CESAREAN SECTION    . LAPAROTOMY N/A 03/14/2015   Procedure: EXPLORATORY LAPAROTOMY;  Surgeon: Molly Maduro, MD;  Location: [facility] ORS;  Service: General;  Laterality: N/A;    Prior to Admission medications   Medication Sig Start Date End Date Taking? Authorizing Provider  aspirin 81 MG tablet Take by mouth. 12/24/14   Historical Provider, MD  Calcium Carbonate-Vitamin D (CALCIUM 600+D) 600-400 MG-UNIT tablet Take by mouth. 01/09/15   Historical Provider, MD  Cholecalciferol (VITAMIN D) 2000 units CAPS Take by mouth.    Historical Provider, MD  meclizine (ANTIVERT) 25 MG tablet Take 1 tablet (25 mg total) by mouth 3 (three) times daily as needed for dizziness. 10/14/16   Lavonia Drafts, MD  meloxicam (MOBIC) 15 MG tablet Take 1 tablet (15 mg total) by mouth daily. 10/05/16   Steele Sizer, MD  Multiple Vitamins-Minerals (WOMENS MULTIVITAMIN PO) Take by mouth.    Historical Provider, MD     Allergies Patient has no known allergies.  Family History  Problem Relation Age of Onset  . Hyperlipidemia Mother   . Hypertension Mother   . Multiple sclerosis Father   . Multiple sclerosis Sister     Social History Social History  Substance Use Topics  . Smoking status: Never Smoker  . Smokeless tobacco: Never Used  . Alcohol use No    Review of Systems  Constitutional: Dizziness as above Eyes: No visual changes.  ENT: No  neck pain Cardiovascular: Denies chest pain. Respiratory: Denies shortness of breath. Gastrointestinal: No abdominal pain.   Genitourinary: Negative for dysuria. Musculoskeletal: Negative for back pain. Skin: Negative for rash. Neurological: Negative for headaches or weakness  10-point ROS otherwise  negative.  ____________________________________________   PHYSICAL EXAM:  VITAL SIGNS: ED Triage Vitals  Enc Vitals Group     BP 10/14/16 1612 (!) 157/83     Pulse Rate 10/14/16 1612 70     Resp 10/14/16 1612 18     Temp 10/14/16 1612 98.1 F (36.7 C)     Temp Source 10/14/16 1612 Oral     SpO2 10/14/16 1612 97 %     Weight 10/14/16 1612 161 lb (73 kg)     Height 10/14/16 1612 5' 2.5" (1.588 m)     Head Circumference --      Peak Flow --      Pain Score 10/14/16 1613 0     Pain Loc --      Pain Edu? --      Excl. in [place]? --     Constitutional: Alert and oriented. No acute distress. Pleasant and interactive Eyes: Conjunctivae are normal. EOMI, horizontal nystagmus Head: Atraumatic. Nose: No congestion/rhinnorhea. Mouth/Throat: Mucous membranes are moist.   Neck:  Painless ROM Cardiovascular: Normal rate, regular rhythm. Grossly normal heart sounds.  Good peripheral circulation. Respiratory: Normal respiratory effort.  No retractions. Lungs CTAB. Gastrointestinal: Soft and nontender. No distention.  No CVA tenderness. Genitourinary: deferred Musculoskeletal: No lower extremity tenderness nor edema.  Warm and well perfused Neurologic:  Normal speech and language. No gross focal neurologic deficits are appreciated.  Skin:  Skin is warm, dry and intact. No rash noted. Psychiatric: Mood and affect are normal. Speech and behavior are normal.  ____________________________________________   LABS (all labs ordered are listed, but only abnormal results are displayed)  Labs Reviewed  COMPREHENSIVE METABOLIC PANEL - Abnormal; Notable for the following:       Result Value   Glucose, Bld 134 (*)    All other components within normal limits  URINALYSIS, COMPLETE (UACMP) WITH MICROSCOPIC - Abnormal; Notable for the following:    Color, Urine YELLOW (*)    APPearance CLOUDY (*)    Ketones, ur 5 (*)    Protein, ur 30 (*)    Leukocytes, UA TRACE (*)    All other components within  normal limits  LIPASE, BLOOD  CBC   ____________________________________________  EKG  None ____________________________________________  RADIOLOGY  CT head unremarkable ____________________________________________   PROCEDURES  Procedure(s) performed: No    Critical Care performed: No ____________________________________________   INITIAL IMPRESSION / ASSESSMENT AND PLAN / ED COURSE  Pertinent labs & imaging results that were available during my care of the patient were reviewed by me and considered in my medical decision making (see chart for details).  Patient presents with symptoms of vertigo. She has no concerning neuro deficits. No headache. CT scan is unremarkable. Lab work is reassuring. She was treated with IV fluids, a small dose of Ativan and meclizine. This appeared to help her symptoms significantly.  Clinical Course   Presentation is most consistent with BPV, we discussed the need for outpatient follow-up and return precautions as well ____________________________________________   FINAL CLINICAL IMPRESSION(S) / ED DIAGNOSES  Final diagnoses:  Vertigo      NEW MEDICATIONS STARTED DURING THIS VISIT:  New Prescriptions   MECLIZINE (ANTIVERT) 25 MG TABLET    Take 1 tablet (25 mg total) by mouth 3 (  three) times daily as needed for dizziness.     Note:  This document was prepared using Dragon voice recognition software and may include unintentional dictation errors.    Lavonia Drafts, MD 10/14/16 2159

## 2016-10-14 NOTE — ED Triage Notes (Signed)
Pt reports nausea,vomiting and dizziness that began this morning. Pt denies pain or diarrhea. Pt was brought over from [facility] due to elevated BP.

## 2016-10-18 ENCOUNTER — Encounter: Payer: Self-pay | Admitting: Family Medicine

## 2016-10-18 ENCOUNTER — Ambulatory Visit (INDEPENDENT_AMBULATORY_CARE_PROVIDER_SITE_OTHER): Payer: Medicare Other | Admitting: Family Medicine

## 2016-10-18 VITALS — BP 148/84 | HR 97 | Temp 97.9°F | Resp 16 | Ht 63.0 in | Wt 168.0 lb

## 2016-10-18 DIAGNOSIS — R42 Dizziness and giddiness: Secondary | ICD-10-CM

## 2016-10-18 DIAGNOSIS — I6523 Occlusion and stenosis of bilateral carotid arteries: Secondary | ICD-10-CM

## 2016-10-18 DIAGNOSIS — R030 Elevated blood-pressure reading, without diagnosis of hypertension: Secondary | ICD-10-CM

## 2016-10-18 NOTE — Progress Notes (Signed)
Name: Paula Mills   MRN: [id number]    DOB: 19-Dec-1945   Date:10/18/2016       Progress Note  Subjective  Chief Complaint  Chief Complaint  Patient presents with  . Dizziness    pt still having dizziness since hospital stay    HPI  Dizziness: she developed acute onset of vertigo  4 days ago. Husband took her to [facility] , she had normal labs and CT head. The vertigo episode lasted about 2 minutes, she continued to feel dizzy while at the hospital , but not as intense. She was given meclizine and has been symptoms free. She states no associated cold symptoms, ear pain, tinnitus or hearing loss. Symptoms were worse when looking up, but not with rotation of head. She had one episode of nausea and vomiting. She has been feeling fine since.    Past Surgical History:  Procedure Laterality Date  . ABDOMINAL HYSTERECTOMY    . APPENDECTOMY N/A 03/14/2015   Procedure: APPENDECTOMY;  Surgeon: Molly Maduro, MD;  Location: [facility] ORS;  Service: General;  Laterality: N/A;  . CESAREAN SECTION    . LAPAROTOMY N/A 03/14/2015   Procedure: EXPLORATORY LAPAROTOMY;  Surgeon: Molly Maduro, MD;  Location: [facility] ORS;  Service: General;  Laterality: N/A;    Family History  Problem Relation Age of Onset  . Hyperlipidemia Mother   . Hypertension Mother   . Multiple sclerosis Father   . Multiple sclerosis Sister     Social History   Social History  . Marital status: Married    Spouse name: N/A  . Number of children: N/A  . Years of education: N/A   Occupational History  . Not on file.   Social History Main Topics  . Smoking status: Never Smoker  . Smokeless tobacco: Never Used  . Alcohol use No  . Drug use: No  . Sexual activity: Not on file   Other Topics Concern  . Not on file   Social History Narrative  . No narrative on file     Current Outpatient Prescriptions:  .  aspirin 81 MG tablet, Take by mouth., Disp: , Rfl:  .  Calcium Carbonate-Vitamin D (CALCIUM 600+D) 600-400  MG-UNIT tablet, Take by mouth., Disp: , Rfl:  .  Cholecalciferol (VITAMIN D) 2000 units CAPS, Take by mouth., Disp: , Rfl:  .  meclizine (ANTIVERT) 25 MG tablet, Take 1 tablet (25 mg total) by mouth 3 (three) times daily as needed for dizziness., Disp: 20 tablet, Rfl: 0 .  meloxicam (MOBIC) 15 MG tablet, Take 1 tablet (15 mg total) by mouth daily., Disp: 30 tablet, Rfl: 0 .  Multiple Vitamins-Minerals (WOMENS MULTIVITAMIN PO), Take by mouth., Disp: , Rfl:   No Known Allergies   ROS  Ten systems reviewed and is negative except as mentioned in HPI   Objective  Vitals:   10/18/16 1024  BP: (!) 148/84  Pulse: 97  Resp: 16  Temp: 97.9 F (36.6 C)  SpO2: 97%  Weight: 168 lb (76.2 kg)  Height: 5' 3"  (1.6 m)    Body mass index is 29.76 kg/m.  Physical Exam  Constitutional: Patient appears well-developed and well-nourished. Obese No distress.  HEENT: head atraumatic, normocephalic, pupils equal and reactive to light, ears normal TM, neck supple, throat within normal limits Cardiovascular: Normal rate, regular rhythm and normal heart sounds.  No murmur heard. No BLE edema. Pulmonary/Chest: Effort normal and breath sounds normal. No respiratory distress. Abdominal: Soft.  There is no tenderness. Psychiatric: Patient  has a normal mood and affect. behavior is normal. Judgment and thought content normal. Neurological: no nystagmus, normal strength, Romberg negative  Recent Results (from the past 2160 hour(s))  Lipase, blood     Status: None   Collection Time: 10/14/16  4:14 PM  Result Value Ref Range   Lipase 19 11 - 51 U/L  Comprehensive metabolic panel     Status: Abnormal   Collection Time: 10/14/16  4:14 PM  Result Value Ref Range   Sodium 138 135 - 145 mmol/L   Potassium 3.5 3.5 - 5.1 mmol/L   Chloride 105 101 - 111 mmol/L   CO2 25 22 - 32 mmol/L   Glucose, Bld 134 (H) 65 - 99 mg/dL   BUN 7 6 - 20 mg/dL   Creatinine, Ser 0.47 0.44 - 1.00 mg/dL   Calcium 9.1 8.9 - 10.3  mg/dL   Total Protein 7.7 6.5 - 8.1 g/dL   Albumin 3.9 3.5 - 5.0 g/dL   AST 24 15 - 41 U/L   ALT 19 14 - 54 U/L   Alkaline Phosphatase 83 38 - 126 U/L   Total Bilirubin 0.7 0.3 - 1.2 mg/dL   GFR calc non Af Amer >60 >60 mL/min   GFR calc Af Amer >60 >60 mL/min    Comment: (NOTE) The eGFR has been calculated using the CKD EPI equation. This calculation has not been validated in all clinical situations. eGFR's persistently <60 mL/min signify possible Chronic Kidney Disease.    Anion gap 8 5 - 15  CBC     Status: None   Collection Time: 10/14/16  4:14 PM  Result Value Ref Range   WBC 10.0 3.6 - 11.0 K/uL   RBC 4.96 3.80 - 5.20 MIL/uL   Hemoglobin 13.5 12.0 - 16.0 g/dL   HCT 40.9 35.0 - 47.0 %   MCV 82.4 80.0 - 100.0 fL   MCH 27.3 26.0 - 34.0 pg   MCHC 33.1 32.0 - 36.0 g/dL   RDW 14.0 11.5 - 14.5 %   Platelets 249 150 - 440 K/uL  Urinalysis, Complete w Microscopic     Status: Abnormal   Collection Time: 10/14/16  4:15 PM  Result Value Ref Range   Color, Urine YELLOW (A) YELLOW   APPearance CLOUDY (A) CLEAR   Specific Gravity, Urine 1.012 1.005 - 1.030   pH 7.0 5.0 - 8.0   Glucose, UA NEGATIVE NEGATIVE mg/dL   Hgb urine dipstick NEGATIVE NEGATIVE   Bilirubin Urine NEGATIVE NEGATIVE   Ketones, ur 5 (A) NEGATIVE mg/dL   Protein, ur 30 (A) NEGATIVE mg/dL   Nitrite NEGATIVE NEGATIVE   Leukocytes, UA TRACE (A) NEGATIVE   RBC / HPF NONE SEEN 0 - 5 RBC/hpf   WBC, UA 0-5 0 - 5 WBC/hpf   Bacteria, UA NONE SEEN NONE SEEN   Squamous Epithelial / LPF NONE SEEN NONE SEEN   Mucous PRESENT    Amorphous Crystal PRESENT      PHQ2/9: Depression screen [facility] 2/9 02/09/2016  Decreased Interest 0  Down, Depressed, Hopeless 0  PHQ - 2 Score 0     Fall Risk: Fall Risk  02/09/2016  Falls in the past year? No     Assessment & Plan  1. Vertigo  Based on [facility] records, it was triggered by head movement, today, patient is not so sure. Taking Meclizine and symptoms resolved, we will stop  medication and refer to ENT. Reviewed records from [facility] - Ambulatory referral to ENT - US  Carotid Duplex Bilateral; Future  2. Elevated blood pressure reading  Previously normal BP, she states she is not exercising since last week, and is worried about taking medications, we will monitor for now  3. Carotid artery calcification, bilateral  - US Carotid Duplex Bilateral; Future

## 2016-10-25 DIAGNOSIS — H8111 Benign paroxysmal vertigo, right ear: Secondary | ICD-10-CM | POA: Diagnosis not present

## 2016-10-26 ENCOUNTER — Ambulatory Visit
Admission: RE | Admit: 2016-10-26 | Discharge: 2016-10-26 | Disposition: A | Discharge status: Home / Self Care | Payer: Medicare Other | Source: Ambulatory Visit | Attending: Family Medicine | Admitting: Family Medicine

## 2016-10-26 DIAGNOSIS — I6523 Occlusion and stenosis of bilateral carotid arteries: Secondary | ICD-10-CM | POA: Diagnosis not present

## 2016-10-26 DIAGNOSIS — R42 Dizziness and giddiness: Secondary | ICD-10-CM | POA: Insufficient documentation

## 2016-10-27 ENCOUNTER — Other Ambulatory Visit: Payer: Self-pay | Admitting: Family Medicine

## 2016-10-27 DIAGNOSIS — R42 Dizziness and giddiness: Secondary | ICD-10-CM | POA: Diagnosis not present

## 2016-10-31 DIAGNOSIS — R42 Dizziness and giddiness: Secondary | ICD-10-CM | POA: Diagnosis not present

## 2016-11-03 DIAGNOSIS — R42 Dizziness and giddiness: Secondary | ICD-10-CM | POA: Diagnosis not present

## 2016-11-08 DIAGNOSIS — M25561 Pain in right knee: Secondary | ICD-10-CM | POA: Diagnosis not present

## 2016-11-17 DIAGNOSIS — R42 Dizziness and giddiness: Secondary | ICD-10-CM | POA: Diagnosis not present

## 2017-01-16 ENCOUNTER — Telehealth: Payer: Self-pay | Admitting: Family Medicine

## 2017-01-16 NOTE — Telephone Encounter (Signed)
Lm with husband for pt to return call  to schedule AWV with [facility] - knb

## 2017-01-19 ENCOUNTER — Other Ambulatory Visit: Payer: Self-pay | Admitting: Family Medicine

## 2017-01-19 DIAGNOSIS — Z85038 Personal history of other malignant neoplasm of large intestine: Secondary | ICD-10-CM

## 2017-01-19 DIAGNOSIS — Z1231 Encounter for screening mammogram for malignant neoplasm of breast: Secondary | ICD-10-CM

## 2017-01-19 DIAGNOSIS — Z1211 Encounter for screening for malignant neoplasm of colon: Secondary | ICD-10-CM

## 2017-01-19 DIAGNOSIS — Z08 Encounter for follow-up examination after completed treatment for malignant neoplasm: Secondary | ICD-10-CM

## 2017-01-27 ENCOUNTER — Other Ambulatory Visit: Payer: Self-pay

## 2017-01-27 ENCOUNTER — Telehealth: Payer: Self-pay

## 2017-01-27 ENCOUNTER — Telehealth: Payer: Self-pay | Admitting: Gastroenterology

## 2017-01-27 DIAGNOSIS — Z1211 Encounter for screening for malignant neoplasm of colon: Secondary | ICD-10-CM

## 2017-01-27 NOTE — Telephone Encounter (Signed)
Gastroenterology Pre-Procedure Review  Request Date: Tue May 1st Requesting Physician: Dr. Allen Norris  PATIENT REVIEW QUESTIONS: The patient responded to the following health history questions as indicated:    1. Are you having any GI issues? no 2. Do you have a personal history of Polyps? no 3. Do you have a family history of Colon Cancer or Polyps? no 4. Diabetes Mellitus? no 5. Joint replacements in the past 12 months?no 6. Major health problems in the past 3 months?no 7. Any artificial heart valves, MVP, or defibrillator?no    MEDICATIONS & ALLERGIES:    Patient reports the following regarding taking any anticoagulation/antiplatelet therapy:   Plavix, Coumadin, Eliquis, Xarelto, Lovenox, Pradaxa, Brilinta, or Effient? no Aspirin? no  Patient confirms/reports the following medications:  Current Outpatient Prescriptions  Medication Sig Dispense Refill  . aspirin 81 MG tablet Take by mouth.    . Calcium Carbonate-Vitamin D (CALCIUM 600+D) 600-400 MG-UNIT tablet Take by mouth.    . Cholecalciferol (VITAMIN D) 2000 units CAPS Take by mouth.    . meclizine (ANTIVERT) 25 MG tablet Take 1 tablet (25 mg total) by mouth 3 (three) times daily as needed for dizziness. 20 tablet 0  . meloxicam (MOBIC) 15 MG tablet Take 1 tablet (15 mg total) by mouth daily. 30 tablet 0  . Multiple Vitamins-Minerals (WOMENS MULTIVITAMIN PO) Take by mouth.     No current facility-administered medications for this visit.     Patient confirms/reports the following allergies:  No Known Allergies  No orders of the defined types were placed in this encounter.   AUTHORIZATION INFORMATION Primary Insurance: 1D#: Group #:  Secondary Insurance: 1D#: Group #:  SCHEDULE INFORMATION: Date: Tue May 1st, Dr. Allen Norris   Time: [place]

## 2017-01-27 NOTE — Telephone Encounter (Signed)
01/27/17 No prior auth required for [facility] for Screening Colonoscopy [phone] / Z12.11

## 2017-01-30 ENCOUNTER — Encounter: Payer: Self-pay | Admitting: *Deleted

## 2017-01-31 ENCOUNTER — Ambulatory Visit: Payer: Medicare Other | Admitting: Anesthesiology

## 2017-01-31 ENCOUNTER — Encounter
Admission: RE | Disposition: A | Discharge status: Home / Self Care | Payer: Self-pay | Source: Ambulatory Visit | Attending: Gastroenterology

## 2017-01-31 ENCOUNTER — Ambulatory Visit
Admission: RE | Admit: 2017-01-31 | Discharge: 2017-01-31 | Disposition: A | Discharge status: Home / Self Care | Payer: Medicare Other | Source: Ambulatory Visit | Attending: Gastroenterology | Admitting: Gastroenterology

## 2017-01-31 ENCOUNTER — Encounter: Payer: Self-pay | Admitting: *Deleted

## 2017-01-31 DIAGNOSIS — Z79899 Other long term (current) drug therapy: Secondary | ICD-10-CM | POA: Diagnosis not present

## 2017-01-31 DIAGNOSIS — Z1211 Encounter for screening for malignant neoplasm of colon: Secondary | ICD-10-CM | POA: Diagnosis not present

## 2017-01-31 DIAGNOSIS — Z8249 Family history of ischemic heart disease and other diseases of the circulatory system: Secondary | ICD-10-CM | POA: Insufficient documentation

## 2017-01-31 DIAGNOSIS — M1990 Unspecified osteoarthritis, unspecified site: Secondary | ICD-10-CM | POA: Insufficient documentation

## 2017-01-31 DIAGNOSIS — Z9049 Acquired absence of other specified parts of digestive tract: Secondary | ICD-10-CM | POA: Insufficient documentation

## 2017-01-31 DIAGNOSIS — I739 Peripheral vascular disease, unspecified: Secondary | ICD-10-CM | POA: Diagnosis not present

## 2017-01-31 DIAGNOSIS — D125 Benign neoplasm of sigmoid colon: Secondary | ICD-10-CM

## 2017-01-31 DIAGNOSIS — D124 Benign neoplasm of descending colon: Secondary | ICD-10-CM | POA: Insufficient documentation

## 2017-01-31 DIAGNOSIS — K635 Polyp of colon: Secondary | ICD-10-CM

## 2017-01-31 DIAGNOSIS — D122 Benign neoplasm of ascending colon: Secondary | ICD-10-CM

## 2017-01-31 DIAGNOSIS — Z7982 Long term (current) use of aspirin: Secondary | ICD-10-CM | POA: Diagnosis not present

## 2017-01-31 DIAGNOSIS — Z90710 Acquired absence of both cervix and uterus: Secondary | ICD-10-CM | POA: Diagnosis not present

## 2017-01-31 DIAGNOSIS — D121 Benign neoplasm of appendix: Secondary | ICD-10-CM | POA: Diagnosis not present

## 2017-01-31 DIAGNOSIS — K648 Other hemorrhoids: Secondary | ICD-10-CM | POA: Diagnosis not present

## 2017-01-31 HISTORY — PX: COLONOSCOPY WITH PROPOFOL: SHX5780

## 2017-01-31 SURGERY — COLONOSCOPY WITH PROPOFOL
Anesthesia: General

## 2017-01-31 MED ORDER — PROPOFOL 10 MG/ML IV BOLUS
INTRAVENOUS | Status: AC
  Filled 2017-01-31: qty 20

## 2017-01-31 MED ORDER — PROPOFOL 10 MG/ML IV BOLUS
INTRAVENOUS | Status: DC | PRN
  Administered 2017-01-31: 40 mg via INTRAVENOUS

## 2017-01-31 MED ORDER — PROPOFOL 500 MG/50ML IV EMUL
INTRAVENOUS | Status: DC | PRN
  Administered 2017-01-31: 150 ug/kg/min via INTRAVENOUS

## 2017-01-31 MED ORDER — PROPOFOL 500 MG/50ML IV EMUL
INTRAVENOUS | Status: AC
  Filled 2017-01-31: qty 50

## 2017-01-31 MED ORDER — SODIUM CHLORIDE 0.9 % IV SOLN
INTRAVENOUS | Status: DC
  Administered 2017-01-31: 1000 mL via INTRAVENOUS

## 2017-01-31 MED ORDER — LIDOCAINE HCL (CARDIAC) 20 MG/ML IV SOLN
INTRAVENOUS | Status: DC | PRN
  Administered 2017-01-31: 50 mg via INTRAVENOUS

## 2017-01-31 MED ORDER — LIDOCAINE HCL (PF) 2 % IJ SOLN
INTRAMUSCULAR | Status: AC
  Filled 2017-01-31: qty 2

## 2017-01-31 NOTE — H&P (Signed)
   Lucilla Lame, MD [facility] [address], [place], [place] [address] Phone: [phone] Fax : [phone]  Primary Care Physician:  Loistine Chance, MD Primary Gastroenterologist:  Dr. Allen Norris  Pre-Procedure History & Physical: HPI:  Paula Mills is a 71 y.o. female is here for a screening colonoscopy.   History reviewed. No pertinent past medical history.  Past Surgical History:  Procedure Laterality Date  . ABDOMINAL HYSTERECTOMY    . APPENDECTOMY N/A 03/14/2015   Procedure: APPENDECTOMY;  Surgeon: Molly Maduro, MD;  Location: [facility] ORS;  Service: General;  Laterality: N/A;  . CESAREAN SECTION    . LAPAROTOMY N/A 03/14/2015   Procedure: EXPLORATORY LAPAROTOMY;  Surgeon: Molly Maduro, MD;  Location: [facility] ORS;  Service: General;  Laterality: N/A;    Prior to Admission medications   Medication Sig Start Date End Date Taking? Authorizing Provider  aspirin 81 MG tablet Take by mouth. 12/24/14   Historical Provider, MD  Calcium Carbonate-Vitamin D (CALCIUM 600+D) 600-400 MG-UNIT tablet Take by mouth. 01/09/15   Historical Provider, MD  Cholecalciferol (VITAMIN D) 2000 units CAPS Take by mouth.    Historical Provider, MD  meclizine (ANTIVERT) 25 MG tablet Take 1 tablet (25 mg total) by mouth 3 (three) times daily as needed for dizziness. 10/14/16   Lavonia Drafts, MD  meloxicam (MOBIC) 15 MG tablet Take 1 tablet (15 mg total) by mouth daily. 10/05/16   Steele Sizer, MD  Multiple Vitamins-Minerals (WOMENS MULTIVITAMIN PO) Take by mouth.    Historical Provider, MD    Allergies as of 01/27/2017  . (No Known Allergies)    Family History  Problem Relation Age of Onset  . Hyperlipidemia Mother   . Hypertension Mother   . Multiple sclerosis Father   . Multiple sclerosis Sister     Social History   Social History  . Marital status: Married    Spouse name: N/A  . Number of children: N/A  . Years of education: N/A   Occupational History  . Not on file.   Social  History Main Topics  . Smoking status: Never Smoker  . Smokeless tobacco: Never Used  . Alcohol use No  . Drug use: No  . Sexual activity: Not on file   Other Topics Concern  . Not on file   Social History Narrative  . No narrative on file    Review of Systems: See HPI, otherwise negative ROS  Physical Exam: BP (!) 160/94   Pulse 65   Temp 97.1 F (36.2 C) (Tympanic)   Resp 18   Ht 5\' 2"  (1.575 m)   Wt 163 lb (73.9 kg)   SpO2 100%   BMI 29.81 kg/m  General:   Alert,  pleasant and cooperative in NAD Head:  Normocephalic and atraumatic. Neck:  Supple; no masses or thyromegaly. Lungs:  Clear throughout to auscultation.    Heart:  Regular rate and rhythm. Abdomen:  Soft, nontender and nondistended. Normal bowel sounds, without guarding, and without rebound.   Neurologic:  Alert and  oriented x4;  grossly normal neurologically.  Impression/Plan: Paula Mills is now here to undergo a screening colonoscopy.  Risks, benefits, and alternatives regarding colonoscopy have been reviewed with the patient.  Questions have been answered.  All parties agreeable.

## 2017-01-31 NOTE — Op Note (Signed)
[facility] Gastroenterology Patient Name: Paula Mills Procedure Date: 01/31/2017 10:11 AM MRN: [id number] Account #: [id number] Date of Birth: May 15, 1946 Admit Type: Outpatient Age: 71 Room: [facility] ENDO ROOM 4 Gender: Female Note Status: Finalized Procedure:            Colonoscopy Indications:          Screening for colorectal malignant neoplasm Providers:            Lucilla Lame MD, MD Referring MD:         Bethena Roys. Sowles, MD (Referring MD) Medicines:            Propofol per Anesthesia Complications:        No immediate complications. Procedure:            Pre-Anesthesia Assessment:                       - Prior to the procedure, a History and Physical was                        performed, and patient medications and allergies were                        reviewed. The patient's tolerance of previous                        anesthesia was also reviewed. The risks and benefits of                        the procedure and the sedation options and risks were                        discussed with the patient. All questions were                        answered, and informed consent was obtained. Prior                        Anticoagulants: The patient has taken no previous                        anticoagulant or antiplatelet agents. ASA Grade                        Assessment: II - A patient with mild systemic disease.                        After reviewing the risks and benefits, the patient was                        deemed in satisfactory condition to undergo the                        procedure.                       After obtaining informed consent, the colonoscope was                        passed under direct vision. Throughout the procedure,  the patient's blood pressure, pulse, and oxygen                        saturations were monitored continuously. The Olympus                        CF-H180AL colonoscope ( S#: [id number] ) was introduced                         through the anus and advanced to the the cecum,                        identified by appendiceal orifice and ileocecal valve.                        The colonoscopy was performed without difficulty. The                        patient tolerated the procedure well. The quality of                        the bowel preparation was excellent. Findings:      The perianal and digital rectal examinations were normal.      A 4 mm polyp was found in the ascending colon. The polyp was sessile.       The polyp was removed with a cold biopsy forceps. Resection and       retrieval were complete.      A 4 mm polyp was found in the sigmoid colon. The polyp was sessile. The       polyp was removed with a cold biopsy forceps. Resection and retrieval       were complete.      Non-bleeding internal hemorrhoids were found during retroflexion. The       hemorrhoids were Grade I (internal hemorrhoids that do not prolapse). Impression:           - One 4 mm polyp in the ascending colon, removed with a                        cold biopsy forceps. Resected and retrieved.                       - One 4 mm polyp in the sigmoid colon, removed with a                        cold biopsy forceps. Resected and retrieved.                       - Non-bleeding internal hemorrhoids. Recommendation:       - Discharge patient to home.                       - Resume previous diet.                       - Continue present medications.                       - Repeat colonoscopy in 5 years if polyp adenoma and 10  years if hyperplastic Procedure Code(s):    --- Professional ---                       [phone], Colonoscopy, flexible; with biopsy, single or                        multiple Diagnosis Code(s):    --- Professional ---                       Z12.11, Encounter for screening for malignant neoplasm                        of colon                       D12.2, Benign neoplasm of  ascending colon                       D12.5, Benign neoplasm of sigmoid colon CPT copyright 2016 American Medical Association. All rights reserved. The codes documented in this report are preliminary and upon coder review may  be revised to meet current compliance requirements. Lucilla Lame MD, MD 01/31/2017 10:29:42 AM This report has been signed electronically. Number of Addenda: 0 Note Initiated On: 01/31/2017 10:11 AM Scope Withdrawal Time: 0 hours 6 minutes 52 seconds  Total Procedure Duration: 0 hours 11 minutes 22 seconds       [facility]

## 2017-01-31 NOTE — Transfer of Care (Signed)
Immediate Anesthesia Transfer of Care Note  Patient: Paula Mills  Procedure(s) Performed: Procedure(s): COLONOSCOPY WITH PROPOFOL (N/A)  Patient Location: PACU  Anesthesia Type:General  Level of Consciousness: awake, alert , oriented and patient cooperative  Airway & Oxygen Therapy: Patient Spontanous Breathing and Patient connected to nasal cannula oxygen  Post-op Assessment: Report given to RN and Post -op Vital signs reviewed and stable  Post vital signs: Reviewed and stable  Last Vitals:  Vitals:   01/31/17 0912 01/31/17 1032  BP: (!) 160/94 131/76  Pulse: 65 63  Resp: 18 18  Temp: 36.2 C 36.3 C    Last Pain:  Vitals:   01/31/17 1032  TempSrc: Tympanic         Complications: No apparent anesthesia complications

## 2017-01-31 NOTE — Anesthesia Post-op Follow-up Note (Cosign Needed)
Anesthesia QCDR form completed.        

## 2017-01-31 NOTE — Anesthesia Preprocedure Evaluation (Addendum)
Anesthesia Evaluation  Patient identified by MRN, date of birth, ID band Patient awake    Reviewed: Allergy & Precautions, NPO status , Patient's Chart, lab work & pertinent test results  Airway Mallampati: II  TM Distance: >3 FB     Dental  (+) Chipped   Pulmonary neg pulmonary ROS,    Pulmonary exam normal        Cardiovascular + Peripheral Vascular Disease  Normal cardiovascular exam     Neuro/Psych negative neurological ROS  negative psych ROS   GI/Hepatic Neg liver ROS, hiatal hernia,   Endo/Other  negative endocrine ROS  Renal/GU negative Renal ROS  negative genitourinary   Musculoskeletal  (+) Arthritis , Osteoarthritis,    Abdominal Normal abdominal exam  (+)   Peds negative pediatric ROS (+)  Hematology negative hematology ROS (+)   Anesthesia Other Findings History reviewed. No pertinent past medical history.  Reproductive/Obstetrics                             Anesthesia Physical  Anesthesia Plan  ASA: III  Anesthesia Plan: General   Post-op Pain Management:    Induction: Intravenous  Airway Management Planned: Nasal Cannula  Additional Equipment:   Intra-op Plan:   Post-operative Plan:   Informed Consent: I have reviewed the patients History and Physical, chart, labs and discussed the procedure including the risks, benefits and alternatives for the proposed anesthesia with the patient or authorized representative who has indicated his/her understanding and acceptance.   Dental advisory given  Plan Discussed with: CRNA and Surgeon  Anesthesia Plan Comments:         Anesthesia Quick Evaluation

## 2017-01-31 NOTE — Anesthesia Postprocedure Evaluation (Signed)
Anesthesia Post Note  Patient: Paula Mills  Procedure(s) Performed: Procedure(s) (LRB): COLONOSCOPY WITH PROPOFOL (N/A)  Patient location during evaluation: PACU Anesthesia Type: General Level of consciousness: awake and alert and oriented Pain management: pain level controlled Vital Signs Assessment: post-procedure vital signs reviewed and stable Respiratory status: spontaneous breathing Cardiovascular status: blood pressure returned to baseline Anesthetic complications: no     Last Vitals:  Vitals:   01/31/17 1032 01/31/17 1052  BP: 131/76 (!) 159/92  Pulse: 63 61  Resp: 18 12  Temp: 36.3 C     Last Pain:  Vitals:   01/31/17 1032  TempSrc: Tympanic                 [name]

## 2017-02-01 ENCOUNTER — Encounter: Payer: Self-pay | Admitting: Gastroenterology

## 2017-02-01 LAB — SURGICAL PATHOLOGY

## 2017-02-02 ENCOUNTER — Encounter: Payer: Self-pay | Admitting: Gastroenterology

## 2017-02-09 ENCOUNTER — Encounter: Payer: Self-pay | Admitting: Family Medicine

## 2017-02-09 ENCOUNTER — Ambulatory Visit (INDEPENDENT_AMBULATORY_CARE_PROVIDER_SITE_OTHER): Payer: Medicare Other | Admitting: Family Medicine

## 2017-02-09 ENCOUNTER — Ambulatory Visit
Admission: RE | Admit: 2017-02-09 | Discharge: 2017-02-09 | Disposition: A | Discharge status: Home / Self Care | Payer: Medicare Other | Source: Ambulatory Visit | Attending: Family Medicine | Admitting: Family Medicine

## 2017-02-09 VITALS — BP 126/82 | HR 80 | Temp 98.0°F | Resp 16 | Ht 63.0 in | Wt 166.3 lb

## 2017-02-09 DIAGNOSIS — M79661 Pain in right lower leg: Secondary | ICD-10-CM | POA: Diagnosis not present

## 2017-02-09 DIAGNOSIS — M7121 Synovial cyst of popliteal space [Baker], right knee: Secondary | ICD-10-CM | POA: Insufficient documentation

## 2017-02-09 DIAGNOSIS — M1711 Unilateral primary osteoarthritis, right knee: Secondary | ICD-10-CM

## 2017-02-09 DIAGNOSIS — I6523 Occlusion and stenosis of bilateral carotid arteries: Secondary | ICD-10-CM | POA: Diagnosis not present

## 2017-02-09 NOTE — Progress Notes (Signed)
Name: Paula Mills   MRN: [id number]    DOB: 1946-09-22   Date:02/09/2017       Progress Note  Subjective  Chief Complaint  Chief Complaint  Patient presents with  . Leg Pain    Onset- 1 week, calf pain that radiates to the popliteal area. Worse when laying down.    HPI  Right calf pain: Paula Mills is very active and enjoys exercising at the gym, however last week she noticed pain on her right calf so she took a couple of days off. Last night she went back to the gym and had severe pain at night, a throbbing sensation, no swelling on calf or bruising. She states pain is worse when she first start to walk and at the end of the day. Pain was very intense last night and had to use a ace wrap to help her relax and sleep. No previous history of DVT. She has OA of right knee and had steroid injection done by Dr.Kernodle 11/2016, she still has right knee effusion, and some pain on right popliteal fossa.  Patient Active Problem List   Diagnosis Date Noted  . Screen for colon cancer   . Benign neoplasm of ascending colon   . Polyp of sigmoid colon   . Carotid artery calcification, bilateral 10/18/2016  . At risk for falling 04/08/2016  . H/O: HTN (hypertension) 04/08/2016  . Hypo-ovarianism 04/08/2016  . Asymptomatic varicose veins 04/08/2016  . Avitaminosis D 04/08/2016  . Hiatal hernia 02/09/2016  . Liver cyst 02/09/2016  . Lumbar spondylosis 02/09/2016  . Osteoporosis 02/09/2016  . Dyslipidemia 02/09/2016  . Vegetarian 02/09/2016  . Vitamin D deficiency 02/09/2016  . Calcification of abdominal aorta (HCC) 02/09/2016  . History of small bowel obstruction 03/12/2015    Past Surgical History:  Procedure Laterality Date  . ABDOMINAL HYSTERECTOMY    . APPENDECTOMY N/A 03/14/2015   Procedure: APPENDECTOMY;  Surgeon: Molly Maduro, MD;  Location: [facility] ORS;  Service: General;  Laterality: N/A;  . CESAREAN SECTION    . COLONOSCOPY WITH PROPOFOL N/A 01/31/2017   Procedure:  COLONOSCOPY WITH PROPOFOL;  Surgeon: Lucilla Lame, MD;  Location: [facility] ENDOSCOPY;  Service: Endoscopy;  Laterality: N/A;  . LAPAROTOMY N/A 03/14/2015   Procedure: EXPLORATORY LAPAROTOMY;  Surgeon: Molly Maduro, MD;  Location: [facility] ORS;  Service: General;  Laterality: N/A;    Family History  Problem Relation Age of Onset  . Hyperlipidemia Mother   . Hypertension Mother   . Multiple sclerosis Father   . Multiple sclerosis Sister     Social History   Social History  . Marital status: Married    Spouse name: N/A  . Number of children: N/A  . Years of education: N/A   Occupational History  . Not on file.   Social History Main Topics  . Smoking status: Never Smoker  . Smokeless tobacco: Never Used  . Alcohol use No  . Drug use: No  . Sexual activity: Not on file   Other Topics Concern  . Not on file   Social History Narrative  . No narrative on file     Current Outpatient Prescriptions:  .  Calcium Carbonate-Vitamin D (CALCIUM 600+D) 600-400 MG-UNIT tablet, Take by mouth., Disp: , Rfl:  .  Cholecalciferol (VITAMIN D) 2000 units CAPS, Take by mouth., Disp: , Rfl:  .  Multiple Vitamins-Minerals (WOMENS MULTIVITAMIN PO), Take by mouth., Disp: , Rfl:   No Known Allergies   ROS  Ten systems reviewed and is  negative except as mentioned in HPI   Objective  Vitals:   02/09/17 1236  BP: 126/82  Pulse: 80  Resp: 16  Temp: 98 F (36.7 C)  TempSrc: Oral  SpO2: 97%  Weight: 166 lb 4.8 oz (75.4 kg)  Height: 5\' 3"  (1.6 m)    Body mass index is 29.46 kg/m.  Physical Exam  Constitutional: Patient appears well-developed and well-nourished. Obese No distress.  HEENT: head atraumatic, normocephalic, pupils equal and reactive to light,  neck supple, throat within normal limits Cardiovascular: Normal rate, regular rhythm and normal heart sounds.  No murmur heard. No BLE edema. She has varicose on right lower leg, tender during dorsiflexion of right foot ( calf pain) and  during palpation.  Muscular Skeletal: right knee effusion and also decrease in ability to fully extend right knee Pulmonary/Chest: Effort normal and breath sounds normal. No respiratory distress. Abdominal: Soft.  There is no tenderness. Psychiatric: Patient has a normal mood and affect. behavior is normal. Judgment and thought content normal.  Recent Results (from the past 2160 hour(s))  Surgical pathology     Status: None   Collection Time: 01/31/17 10:19 AM  Result Value Ref Range   SURGICAL PATHOLOGY      Surgical Pathology CASE: [id number] PATIENT: [facility] Surgical Pathology Report     SPECIMEN SUBMITTED: A. Colon polyp, ascending; cbx B. Colon polyp, descending; cbx  CLINICAL HISTORY: None provided  PRE-OPERATIVE DIAGNOSIS: Z12.11 screening colonoscopy  POST-OPERATIVE DIAGNOSIS: None provided.     DIAGNOSIS: A. COLON POLYP, ASCENDING; COLD BIOPSY: - TUBULAR ADENOMA. - NEGATIVE FOR HIGH-GRADE DYSPLASIA AND MALIGNANCY.  B. COLON POLYP, DESCENDING; COLD BIOPSY: - TUBULAR ADENOMA. - NEGATIVE FOR HIGH-GRADE DYSPLASIA AND MALIGNANCY.   GROSS DESCRIPTION:  A. Labeled: C BX ascending colon polyp  Tissue fragment(s): 1  Size: 0.4 cm  Description: tan fragment  Entirely submitted in 1 cassette(s).   B. Labeled: C BX descending colon polyp  Tissue fragment(s): 2  Size: 0.1-0.25 cm  Description: tan fragments  Entirely submitted in 1 cassette(s).    Final Diagnosis performed by Delorse Lek, MD.  Electronically signed 02/01/2017 3:04 :13PM    The electronic signature indicates that the named Attending Pathologist has evaluated the specimen  Technical component performed at [facility], [address], [place], [place] [address] Lab: [phone] Dir: Darrick Penna. Evette Doffing, MD  Professional component performed at [facility], [facility], [place], [place], [place] [address] Lab: [phone] Dir: Dellia Nims. Rubinas, MD        PHQ2/9: Depression screen [facility] 2/9 02/09/2017 02/09/2016  Decreased Interest 0 0  Down, Depressed, Hopeless 0 0  PHQ - 2 Score 0 0     Fall Risk: Fall Risk  02/09/2017 02/09/2016  Falls in the past year? Yes No  Number falls in past yr: 1 -  Injury with Fall? No -     Functional Status Survey: Is the patient deaf or have difficulty hearing?: No Does the patient have difficulty seeing, even when wearing glasses/contacts?: No Does the patient have difficulty concentrating, remembering, or making decisions?: No Does the patient have difficulty walking or climbing stairs?: No Does the patient have difficulty dressing or bathing?: No Does the patient have difficulty doing errands alone such as visiting a doctor's office or shopping?: No    Assessment & Plan  1. Right calf pain  - VAS [place] LOWER EXTREMITY VENOUS (DVT); Future  2. Primary osteoarthritis of right knee  It may be the cause of right calf  pain, and if doppler negative advised to go back to Ortho, Dr. Leanor Kail.

## 2017-02-10 DIAGNOSIS — M1711 Unilateral primary osteoarthritis, right knee: Secondary | ICD-10-CM | POA: Diagnosis not present

## 2017-02-13 ENCOUNTER — Telehealth: Payer: Self-pay

## 2017-02-13 NOTE — Telephone Encounter (Signed)
Patient returned Tashia's call and after she verified her date of birth, ultrasound was reviewed along with Dr. Ancil Boozer message.   She stated that she had already followed up with her orthopedist and was given a cortisone injection. She said that she feels much better and thanks.

## 2017-03-03 ENCOUNTER — Telehealth: Payer: Self-pay | Admitting: Family Medicine

## 2017-03-20 ENCOUNTER — Ambulatory Visit (INDEPENDENT_AMBULATORY_CARE_PROVIDER_SITE_OTHER): Payer: Medicare Other

## 2017-03-20 VITALS — BP 134/78 | HR 68 | Temp 97.4°F | Ht 63.0 in | Wt 165.0 lb

## 2017-03-20 DIAGNOSIS — Z0000 Encounter for general adult medical examination without abnormal findings: Secondary | ICD-10-CM | POA: Diagnosis not present

## 2017-03-20 NOTE — Patient Instructions (Signed)
Paula Mills , Thank you for taking time to come for your Medicare Wellness Visit. I appreciate your ongoing commitment to your health goals. Please review the following plan we discussed and let me know if I can assist you in the future.   Screening recommendations/referrals: Colonoscopy: completed 01/31/17 Mammogram: pt to set up within the next year Bone Density: completed 01/07/15 Recommended yearly ophthalmology/optometry visit for glaucoma screening and checkup Recommended yearly dental visit for hygiene and checkup  Vaccinations: Influenza vaccine: due 06/2017 Pneumococcal vaccine: declined Tdap vaccine: declined Shingles vaccine: declined  Advanced directives: Please bring a copy of your POA (Power of Attorney) and/or Living Will to your next appointment.   Conditions/risks identified: Recommend increasing water intake to 6-8 glasses a day.  Next appointment: 04/10/17 @ 2:00 PM   Preventive Care 72 Years and Older, Female Preventive care refers to lifestyle choices and visits with your health care provider that can promote health and wellness. What does preventive care include?  A yearly physical exam. This is also called an annual well check.  Dental exams once or twice a year.  Routine eye exams. Ask your health care provider how often you should have your eyes checked.  Personal lifestyle choices, including:  Daily care of your teeth and gums.  Regular physical activity.  Eating a healthy diet.  Avoiding tobacco and drug use.  Limiting alcohol use.  Practicing safe sex.  Taking low-dose aspirin every day.  Taking vitamin and mineral supplements as recommended by your health care provider. What happens during an annual well check? The services and screenings done by your health care provider during your annual well check will depend on your age, overall health, lifestyle risk factors, and family history of disease. Counseling  Your health care provider  may ask you questions about your:  Alcohol use.  Tobacco use.  Drug use.  Emotional well-being.  Home and relationship well-being.  Sexual activity.  Eating habits.  History of falls.  Memory and ability to understand (cognition).  Work and work Statistician.  Reproductive health. Screening  You may have the following tests or measurements:  Height, weight, and BMI.  Blood pressure.  Lipid and cholesterol levels. These may be checked every 5 years, or more frequently if you are over 24 years old.  Skin check.  Lung cancer screening. You may have this screening every year starting at age 49 if you have a 30-pack-year history of smoking and currently smoke or have quit within the past 15 years.  Fecal occult blood test (FOBT) of the stool. You may have this test every year starting at age 31.  Flexible sigmoidoscopy or colonoscopy. You may have a sigmoidoscopy every 5 years or a colonoscopy every 10 years starting at age 36.  Hepatitis C blood test.  Hepatitis B blood test.  Sexually transmitted disease (STD) testing.  Diabetes screening. This is done by checking your blood sugar (glucose) after you have not eaten for a while (fasting). You may have this done every 1-3 years.  Bone density scan. This is done to screen for osteoporosis. You may have this done starting at age 44.  Mammogram. This may be done every 1-2 years. Talk to your health care provider about how often you should have regular mammograms. Talk with your health care provider about your test results, treatment options, and if necessary, the need for more tests. Vaccines  Your health care provider may recommend certain vaccines, such as:  Influenza vaccine. This is recommended every  year.  Tetanus, diphtheria, and acellular pertussis (Tdap, Td) vaccine. You may need a Td booster every 10 years.  Zoster vaccine. You may need this after age 5.  Pneumococcal 13-valent conjugate (PCV13) vaccine.  One dose is recommended after age 79.  Pneumococcal polysaccharide (PPSV23) vaccine. One dose is recommended after age 78. Talk to your health care provider about which screenings and vaccines you need and how often you need them. This information is not intended to replace advice given to you by your health care provider. Make sure you discuss any questions you have with your health care provider. Document Released: 10/16/2015 Document Revised: 06/08/2016 Document Reviewed: 07/21/2015 Elsevier Interactive Patient Education  2017 [place] Prevention in the Home Falls can cause injuries. They can happen to people of all ages. There are many things you can do to make your home safe and to help prevent falls. What can I do on the outside of my home?  Regularly fix the edges of walkways and driveways and fix any cracks.  Remove anything that might make you trip as you walk through a door, such as a raised step or threshold.  Trim any bushes or trees on the path to your home.  Use bright outdoor lighting.  Clear any walking paths of anything that might make someone trip, such as rocks or tools.  Regularly check to see if handrails are loose or broken. Make sure that both sides of any steps have handrails.  Any raised decks and porches should have guardrails on the edges.  Have any leaves, snow, or ice cleared regularly.  Use sand or salt on walking paths during winter.  Clean up any spills in your garage right away. This includes oil or grease spills. What can I do in the bathroom?  Use night lights.  Install grab bars by the toilet and in the tub and shower. Do not use towel bars as grab bars.  Use non-skid mats or decals in the tub or shower.  If you need to sit down in the shower, use a plastic, non-slip stool.  Keep the floor dry. Clean up any water that spills on the floor as soon as it happens.  Remove soap buildup in the tub or shower regularly.  Attach bath  mats securely with double-sided non-slip rug tape.  Do not have throw rugs and other things on the floor that can make you trip. What can I do in the bedroom?  Use night lights.  Make sure that you have a light by your bed that is easy to reach.  Do not use any sheets or blankets that are too big for your bed. They should not hang down onto the floor.  Have a firm chair that has side arms. You can use this for support while you get dressed.  Do not have throw rugs and other things on the floor that can make you trip. What can I do in the kitchen?  Clean up any spills right away.  Avoid walking on wet floors.  Keep items that you use a lot in easy-to-reach places.  If you need to reach something above you, use a strong step stool that has a grab bar.  Keep electrical cords out of the way.  Do not use floor polish or wax that makes floors slippery. If you must use wax, use non-skid floor wax.  Do not have throw rugs and other things on the floor that can make you trip. What can  I do with my stairs?  Do not leave any items on the stairs.  Make sure that there are handrails on both sides of the stairs and use them. Fix handrails that are broken or loose. Make sure that handrails are as long as the stairways.  Check any carpeting to make sure that it is firmly attached to the stairs. Fix any carpet that is loose or worn.  Avoid having throw rugs at the top or bottom of the stairs. If you do have throw rugs, attach them to the floor with carpet tape.  Make sure that you have a light switch at the top of the stairs and the bottom of the stairs. If you do not have them, ask someone to add them for you. What else can I do to help prevent falls?  Wear shoes that:  Do not have high heels.  Have rubber bottoms.  Are comfortable and fit you well.  Are closed at the toe. Do not wear sandals.  If you use a stepladder:  Make sure that it is fully opened. Do not climb a closed  stepladder.  Make sure that both sides of the stepladder are locked into place.  Ask someone to hold it for you, if possible.  Clearly mark and make sure that you can see:  Any grab bars or handrails.  First and last steps.  Where the edge of each step is.  Use tools that help you move around (mobility aids) if they are needed. These include:  Canes.  Walkers.  Scooters.  Crutches.  Turn on the lights when you go into a dark area. Replace any light bulbs as soon as they burn out.  Set up your furniture so you have a clear path. Avoid moving your furniture around.  If any of your floors are uneven, fix them.  If there are any pets around you, be aware of where they are.  Review your medicines with your doctor. Some medicines can make you feel dizzy. This can increase your chance of falling. Ask your doctor what other things that you can do to help prevent falls. This information is not intended to replace advice given to you by your health care provider. Make sure you discuss any questions you have with your health care provider. Document Released: 07/16/2009 Document Revised: 02/25/2016 Document Reviewed: 10/24/2014 Elsevier Interactive Patient Education  2017 [employer].

## 2017-03-20 NOTE — Progress Notes (Addendum)
Subjective:   Paula Mills is a 71 y.o. female who presents for Medicare Annual (Subsequent) preventive examination.  Review of Systems:  N/A  Cardiac Risk Factors include: advanced age (>11men, >33 women);dyslipidemia     Objective:     Vitals: BP 134/78 (BP Location: Left Arm)   Pulse 68   Temp 97.4 F (36.3 C) (Oral)   Ht 5\' 3"  (1.6 m)   Wt 165 lb (74.8 kg)   BMI 29.23 kg/m   Body mass index is 29.23 kg/m.   Tobacco History  Smoking Status  . Never Smoker  Smokeless Tobacco  . Never Used     Counseling given: Not Answered   History reviewed. No pertinent past medical history. Past Surgical History:  Procedure Laterality Date  . ABDOMINAL HYSTERECTOMY    . APPENDECTOMY N/A 03/14/2015   Procedure: APPENDECTOMY;  Surgeon: Molly Maduro, MD;  Location: [facility] ORS;  Service: General;  Laterality: N/A;  . CESAREAN SECTION    . COLONOSCOPY WITH PROPOFOL N/A 01/31/2017   Procedure: COLONOSCOPY WITH PROPOFOL;  Surgeon: Lucilla Lame, MD;  Location: [facility] ENDOSCOPY;  Service: Endoscopy;  Laterality: N/A;  . LAPAROTOMY N/A 03/14/2015   Procedure: EXPLORATORY LAPAROTOMY;  Surgeon: Molly Maduro, MD;  Location: [facility] ORS;  Service: General;  Laterality: N/A;   Family History  Problem Relation Age of Onset  . Hyperlipidemia Mother   . Hypertension Mother   . Multiple sclerosis Father   . Multiple sclerosis Sister    History  Sexual Activity  . Sexual activity: Not on file    Outpatient Encounter Prescriptions as of 03/20/2017  Medication Sig  . Calcium Carbonate-Vitamin D (CALCIUM 600+D) 600-400 MG-UNIT tablet Take by mouth.  . Cholecalciferol (VITAMIN D) 2000 units CAPS Take by mouth.  . Multiple Vitamins-Minerals (WOMENS MULTIVITAMIN PO) Take by mouth.   No facility-administered encounter medications on file as of 03/20/2017.     Activities of Daily Living In your present state of health, do you have any difficulty performing the following activities:  03/20/2017 02/09/2017  Hearing? N N  Vision? N N  Difficulty concentrating or making decisions? N N  Walking or climbing stairs? N N  Dressing or bathing? N N  Doing errands, shopping? N N  Preparing Food and eating ? N -  Using the Toilet? N -  In the past six months, have you accidently leaked urine? N -  Do you have problems with loss of bowel control? N -  Managing your Medications? N -  Managing your Finances? N -  Housekeeping or managing your Housekeeping? N -  Some recent data might be hidden    Patient Care Team: Steele Sizer, MD as PCP - General (Family Medicine)    Assessment:     Exercise Activities and Dietary recommendations Current Exercise Habits: Structured exercise class, Type of exercise: strength training/weights;treadmill;walking;yoga;Other - see comments (water aerobics, boot camps), Time (Minutes): > 60, Frequency (Times/Week): 5, Weekly Exercise (Minutes/Week): 0  Goals    . Increase water intake          Recommend increasing water intake to 6-8 glasses a day.      Fall Risk Fall Risk  03/20/2017 02/09/2017 02/09/2016  Falls in the past year? Yes Yes No  Number falls in past yr: 1 1 -  Injury with Fall? No No -  Follow up Falls prevention discussed - -   Depression Screen PHQ 2/9 Scores 03/20/2017 03/20/2017 02/09/2017 02/09/2016  PHQ - 2 Score 0 0 0  0  PHQ- 9 Score 0 - - -     Cognitive Function     6CIT Screen 03/20/2017  What Year? 0 points  What month? 0 points  What time? 0 points  Count back from 20 0 points  Months in reverse 0 points  Repeat phrase 6 points  Total Score 6    Immunization History  Administered Date(s) Administered  . Influenza, Seasonal, Injecte, Preservative Fre 10/01/2013   Screening Tests Health Maintenance  Topic Date Due  . MAMMOGRAM  03/03/2018 (Originally 01/06/2017)  . INFLUENZA VACCINE  05/10/2018 (Originally 05/03/2017)  . PNA vac Low Risk Adult (1 of 2 - PCV13) 09/26/2019 (Originally 08/20/2010)  .  TETANUS/TDAP  10/03/2026 (Originally 10/03/2016)  . COLONOSCOPY  01/31/2022  . DEXA SCAN  Completed  . Hepatitis C Screening  Completed      Plan:  I have personally reviewed and addressed the Medicare Annual Wellness questionnaire and have noted the following in the patient's chart:  A. Medical and social history B. Use of alcohol, tobacco or illicit drugs  C. Current medications and supplements D. Functional ability and status E.  Nutritional status F.  Physical activity G. Advance directives H. List of other physicians I.  Hospitalizations, surgeries, and ER visits in previous 12 months J.  [place] such as hearing and vision if needed, cognitive and depression L. Referrals and appointments - none  In addition, I have reviewed and discussed with patient certain preventive protocols, quality metrics, and best practice recommendations. A written personalized care plan for preventive services as well as general preventive health recommendations were provided to patient.  See attached scanned questionnaire for additional information.   Signed,  Fabio Neighbors, LPN Nurse Health Advisor   MD Recommendations: Pt declined tetanus and pneumonia vaccines today. Pt to schedule mammogram within the next year.   I have reviewed this encounter including the documentation in this note and/or discussed this patient with the provider, Evorn Gong, LPN.  I am certifying that I agree with the content of this note as supervising physician.  Steele Sizer, MD [facility] 04/06/2017, 8:52 PM

## 2017-04-10 ENCOUNTER — Encounter: Payer: Self-pay | Admitting: Family Medicine

## 2017-04-10 ENCOUNTER — Ambulatory Visit (INDEPENDENT_AMBULATORY_CARE_PROVIDER_SITE_OTHER): Payer: Medicare Other | Admitting: Family Medicine

## 2017-04-10 VITALS — BP 134/68 | HR 87 | Temp 97.9°F | Resp 16 | Ht 62.0 in | Wt 164.8 lb

## 2017-04-10 DIAGNOSIS — M810 Age-related osteoporosis without current pathological fracture: Secondary | ICD-10-CM | POA: Diagnosis not present

## 2017-04-10 DIAGNOSIS — E785 Hyperlipidemia, unspecified: Secondary | ICD-10-CM

## 2017-04-10 DIAGNOSIS — Z1231 Encounter for screening mammogram for malignant neoplasm of breast: Secondary | ICD-10-CM | POA: Diagnosis not present

## 2017-04-10 DIAGNOSIS — Z789 Other specified health status: Secondary | ICD-10-CM

## 2017-04-10 DIAGNOSIS — I700 Atherosclerosis of aorta: Secondary | ICD-10-CM | POA: Diagnosis not present

## 2017-04-10 DIAGNOSIS — M545 Low back pain, unspecified: Secondary | ICD-10-CM

## 2017-04-10 DIAGNOSIS — M1711 Unilateral primary osteoarthritis, right knee: Secondary | ICD-10-CM | POA: Diagnosis not present

## 2017-04-10 DIAGNOSIS — E2839 Other primary ovarian failure: Secondary | ICD-10-CM | POA: Diagnosis not present

## 2017-04-10 DIAGNOSIS — E559 Vitamin D deficiency, unspecified: Secondary | ICD-10-CM | POA: Diagnosis not present

## 2017-04-10 DIAGNOSIS — I6523 Occlusion and stenosis of bilateral carotid arteries: Secondary | ICD-10-CM

## 2017-04-10 DIAGNOSIS — R739 Hyperglycemia, unspecified: Secondary | ICD-10-CM

## 2017-04-10 LAB — HEMOGLOBIN AND HEMATOCRIT, BLOOD
HEMATOCRIT: 38.8 % (ref 35.0–45.0)
Hemoglobin: 12.5 g/dL (ref 11.7–15.5)

## 2017-04-10 MED ORDER — ASPIRIN EC 81 MG PO TBEC: 81.0000 mg | DELAYED_RELEASE_TABLET | Freq: Every day | ORAL | 0 refills | Status: DC

## 2017-04-10 NOTE — Progress Notes (Signed)
Name: LYNNIAH JANOSKI   MRN: [id number]    DOB: [birth year]   Date:04/10/2017       Progress Note  Subjective  Chief Complaint  Chief Complaint  Patient presents with  . Annual Exam    HPI  Back Pain: Pt pulled her back while at the gym 2 months ago and back is still stiff in the mornings, but loosens up throughout the day.  We will try Tylenol OTC and will do Xray. Also discussed   Hyperlipidemia: LDL is very high, but she does not want to take statins, and does not want to try any other class of medication, she has carotid atherosclerosis advised to at least take 81 mg aspirin  Metabolic syndrome: we will check labs, she denies polyphagia, polydipsia or polyuria  Osteoporosis: discussed importance of considering medication  Vegetarian : she has a healthy diet, she is trying to avoid carbohydrates. We will recheck labs.    Depression:  Depression screen [facility] 2/9 03/20/2017 03/20/2017 02/09/2017 02/09/2016  Decreased Interest 0 0 0 0  Down, Depressed, Hopeless 0 0 0 0  PHQ - 2 Score 0 0 0 0  Altered sleeping 0 - - -  Tired, decreased energy 0 - - -  Change in appetite 0 - - -  Feeling bad or failure about yourself  0 - - -  Trouble concentrating 0 - - -  Moving slowly or fidgety/restless 0 - - -  Suicidal thoughts 0 - - -  PHQ-9 Score 0 - - -   Hypertension: BP Readings from Last 3 Encounters:  04/10/17 134/68  03/20/17 134/78  02/09/17 126/82   Obesity: Wt Readings from Last 3 Encounters:  04/10/17 164 lb 12.8 oz (74.8 kg)  03/20/17 165 lb (74.8 kg)  02/09/17 166 lb 4.8 oz (75.4 kg)   BMI Readings from Last 3 Encounters:  04/10/17 30.14 kg/m  03/20/17 29.23 kg/m  02/09/17 29.46 kg/m    Alcohol: Drinks wine 1-2 times a month Tobacco use: None, never smoker Hep C: Negative Hep C in 2014,  STD testing and prevention (chl/gon/syphilis): No concern for this Intimate partner violence: No concern Breast cancer: Needs Mammogram - Patient wants to wait until next  year No results found for: [facility]  BRCA gene screening: Not necessary. Cervical cancer screening: No longer having these performed, has history of normal.  Osteoporosis: Had DEXA in 2016, was recommended to f/u in 2 years. No results found for: HMDEXASCAN  Fall prevention/vitamin D:  Lipids: We will re-check labs Lab Results  Component Value Date   CHOL 333 (H) 02/09/2016   Lab Results  Component Value Date   HDL 84 02/09/2016   Lab Results  Component Value Date   LDLCALC 234 (H) 02/09/2016   Lab Results  Component Value Date   TRIG 77 02/09/2016   Lab Results  Component Value Date   CHOLHDL 4.0 02/09/2016   No results found for: LDLDIRECT  Glucose:  Glucose  Date Value Ref Range Status  02/09/2016 85 65 - 99 mg/dL Final   Glucose, Bld  Date Value Ref Range Status  10/14/2016 134 (H) 65 - 99 mg/dL Final  03/12/2015 157 (H) 65 - 99 mg/dL Final  03/11/2015 111 (H) 65 - 99 mg/dL Final   Glucose-Capillary  Date Value Ref Range Status  03/15/2015 151 (H) 65 - 99 mg/dL Final  03/14/2015 157 (H) 65 - 99 mg/dL Final    Colorectal cancer: UTD Lung cancer:   AAA:  Aspirin:  Diet: Well-balanaced Exercise: Exercises 5 days a week Skin cancer:    Patient Active Problem List   Diagnosis Date Noted  . Screen for colon cancer   . Benign neoplasm of ascending colon   . Polyp of sigmoid colon   . Carotid artery calcification, bilateral 10/18/2016  . At risk for falling 04/08/2016  . H/O: HTN (hypertension) 04/08/2016  . Hypo-ovarianism 04/08/2016  . Asymptomatic varicose veins 04/08/2016  . Avitaminosis D 04/08/2016  . Hiatal hernia 02/09/2016  . Liver cyst 02/09/2016  . Lumbar spondylosis 02/09/2016  . Osteoporosis 02/09/2016  . Dyslipidemia 02/09/2016  . Vegetarian 02/09/2016  . Vitamin D deficiency 02/09/2016  . Calcification of abdominal aorta (HCC) 02/09/2016  . History of small bowel obstruction 03/12/2015    Past Surgical History:  Procedure  Laterality Date  . ABDOMINAL HYSTERECTOMY    . APPENDECTOMY N/A 03/14/2015   Procedure: APPENDECTOMY;  Surgeon: Molly Maduro, MD;  Location: [facility] ORS;  Service: General;  Laterality: N/A;  . CESAREAN SECTION    . COLONOSCOPY WITH PROPOFOL N/A 01/31/2017   Procedure: COLONOSCOPY WITH PROPOFOL;  Surgeon: Lucilla Lame, MD;  Location: [facility] ENDOSCOPY;  Service: Endoscopy;  Laterality: N/A;  . LAPAROTOMY N/A 03/14/2015   Procedure: EXPLORATORY LAPAROTOMY;  Surgeon: Molly Maduro, MD;  Location: [facility] ORS;  Service: General;  Laterality: N/A;    Family History  Problem Relation Age of Onset  . Hyperlipidemia Mother   . Hypertension Mother   . Multiple sclerosis Father   . Multiple sclerosis Sister     Social History   Social History  . Marital status: Married    Spouse name: N/A  . Number of children: N/A  . Years of education: N/A   Occupational History  . Not on file.   Social History Main Topics  . Smoking status: Never Smoker  . Smokeless tobacco: Never Used  . Alcohol use 0.0 oz/week     Comment: 1-2 glasses of wine a month  . Drug use: No  . Sexual activity: Yes    Partners: Male   Other Topics Concern  . Not on file   Social History Narrative  . No narrative on file     Current Outpatient Prescriptions:  .  Cholecalciferol (VITAMIN D) 2000 units CAPS, Take 2,000 capsules by mouth daily. , Disp: , Rfl:  .  Multiple Vitamins-Minerals (WOMENS MULTIVITAMIN PO), Take 1 capsule by mouth daily. , Disp: , Rfl:   No Known Allergies   ROS   Constitutional: Negative for fever or weight change.  Respiratory: Negative for cough and shortness of breath.   Cardiovascular: Negative for chest pain or palpitations.  Gastrointestinal: Negative for abdominal pain, no bowel changes.  Musculoskeletal: Negative for gait problem or joint swelling.  Skin: Negative for rash.  Neurological: Negative for dizziness or headache.  No other specific complaints in a complete review of  systems (except as listed in HPI above).   Objective  Vitals:   04/10/17 1403  BP: 134/68  Pulse: 87  Resp: 16  Temp: 97.9 F (36.6 C)  TempSrc: Oral  SpO2: 97%  Weight: 164 lb 12.8 oz (74.8 kg)  Height: 5' 2" (1.575 m)    Body mass index is 30.14 kg/m.  Physical Exam  Constitutional: Patient appears well-developed and well-nourished. No distress.  HENT: Head: Normocephalic and atraumatic. Ears: B TMs ok, no erythema or effusion; Nose: Nose normal. Mouth/Throat: Oropharynx is clear and moist. No oropharyngeal exudate.  Eyes: Conjunctivae and EOM are normal. Pupils are  equal, round, and reactive to light. No scleral icterus.  Neck: Normal range of motion. Neck supple. No JVD present. No thyromegaly present.  Cardiovascular: Normal rate, regular rhythm and normal heart sounds.  No murmur heard. No BLE edema. Pulmonary/Chest: Effort normal and breath sounds normal. No respiratory distress. Abdominal: Soft. Bowel sounds are normal, no distension. There is no tenderness. no masses Breast: she has lumpy right breast, no masses, no nipple discharge or rashes FEMALE GENITALIA:  Deferred  RECTAL:deferred  Musculoskeletal: Normal range of motion, no joint effusions. She has bunions and hammer toes bilaterally Tender during palpation of L-5, S-1 , very mild crepitus with extension of both knees, right worse than left  Neurological: he is alert and oriented to person, place, and time. No cranial nerve deficit. Coordination, balance, strength, speech and gait are normal.  Skin: Skin is warm and dry. No rash noted. No erythema.  Psychiatric: Patient has a normal mood and affect. behavior is normal. Judgment and thought content normal.   Recent Results (from the past 2160 hour(s))  Surgical pathology     Status: None   Collection Time: 01/31/17 10:19 AM  Result Value Ref Range   SURGICAL PATHOLOGY      Surgical Pathology CASE: [id number] PATIENT: [facility] Surgical Pathology  Report     SPECIMEN SUBMITTED: A. Colon polyp, ascending; cbx B. Colon polyp, descending; cbx  CLINICAL HISTORY: None provided  PRE-OPERATIVE DIAGNOSIS: Z12.11 screening colonoscopy  POST-OPERATIVE DIAGNOSIS: None provided.     DIAGNOSIS: A. COLON POLYP, ASCENDING; COLD BIOPSY: - TUBULAR ADENOMA. - NEGATIVE FOR HIGH-GRADE DYSPLASIA AND MALIGNANCY.  B. COLON POLYP, DESCENDING; COLD BIOPSY: - TUBULAR ADENOMA. - NEGATIVE FOR HIGH-GRADE DYSPLASIA AND MALIGNANCY.   GROSS DESCRIPTION:  A. Labeled: C BX ascending colon polyp  Tissue fragment(s): 1  Size: 0.4 cm  Description: tan fragment  Entirely submitted in 1 cassette(s).   B. Labeled: C BX descending colon polyp  Tissue fragment(s): 2  Size: 0.1-0.25 cm  Description: tan fragments  Entirely submitted in 1 cassette(s).    Final Diagnosis performed by Delorse Lek, MD.  Electronically signed 02/01/2017 3:04 :13PM    The electronic signature indicates that the named Attending Pathologist has evaluated the specimen  Technical component performed at [facility], [address] [place], [place] [address] Lab: [phone] Dir: Darrick Penna. Evette Doffing, MD  Professional component performed at [facility], [facility], [place], [place], [place] [address] Lab: [phone] Dir: Dellia Nims. Rubinas, MD       PHQ2/9: Depression screen [facility] 2/9 03/20/2017 03/20/2017 02/09/2017 02/09/2016  Decreased Interest 0 0 0 0  Down, Depressed, Hopeless 0 0 0 0  PHQ - 2 Score 0 0 0 0  Altered sleeping 0 - - -  Tired, decreased energy 0 - - -  Change in appetite 0 - - -  Feeling bad or failure about yourself  0 - - -  Trouble concentrating 0 - - -  Moving slowly or fidgety/restless 0 - - -  Suicidal thoughts 0 - - -  PHQ-9 Score 0 - - -     Fall Risk: Fall Risk  03/20/2017 02/09/2017 02/09/2016  Falls in the past year? Yes Yes No  Number falls in past yr: 1 1 -  Injury with Fall? No No -  Follow up Falls  prevention discussed - -      Assessment & Plan  1. Lumbar pain  - DG Lumbar Spine Complete; Future  2. Carotid artery calcification, bilateral  -  aspirin EC 81 MG tablet; Take 1 tablet (81 mg total) by mouth daily.  Dispense: 30 tablet; Refill: 0  3. Dyslipidemia  -EKG - sinus bradycardia - COMPLETE METABOLIC PANEL WITH GFR  4. Age-related osteoporosis without current pathological fracture  - DG Bone Density; Future  5. Primary osteoarthritis of right knee   6. Vitamin D deficiency   7. Calcification of abdominal aorta (HCC)  - aspirin EC 81 MG tablet; Take 1 tablet (81 mg total) by mouth daily.  Dispense: 30 tablet; Refill: 0  8. Encounter for screening mammogram for breast cancer  - MM Digital Screening; Future  9. Ovarian failure  - DG Bone Density; Future  10. Vegetarian  - COMPLETE METABOLIC PANEL WITH GFR - Methylmalonic Acid, Serum - Hemoglobin and hematocrit, blood  11. Hyperglycemia  - Hemoglobin A1c - Insulin, fasting - COMPLETE METABOLIC PANEL WITH GFR

## 2017-04-11 LAB — COMPLETE METABOLIC PANEL WITH GFR
ALT: 21 U/L (ref 6–29)
AST: 22 U/L (ref 10–35)
Albumin: 4.1 g/dL (ref 3.6–5.1)
Alkaline Phosphatase: 90 U/L (ref 33–130)
BUN: 8 mg/dL (ref 7–25)
CHLORIDE: 104 mmol/L (ref 98–110)
CO2: 26 mmol/L (ref 20–31)
CREATININE: 0.63 mg/dL (ref 0.60–0.93)
Calcium: 9 mg/dL (ref 8.6–10.4)
GFR, Est African American: >89 mL/min (ref >=60)
GFR, Est Non African American: >89 mL/min (ref >=60)
Glucose, Bld: 82 mg/dL (ref 65–99)
Potassium: 3.7 mmol/L (ref 3.5–5.3)
Sodium: 140 mmol/L (ref 135–146)
Total Bilirubin: 0.6 mg/dL (ref 0.2–1.2)
Total Protein: 7.2 g/dL (ref 6.1–8.1)

## 2017-04-11 LAB — HEMOGLOBIN A1C
Hgb A1c MFr Bld: 5.6 % (ref <5.7)
MEAN PLASMA GLUCOSE: 114 mg/dL

## 2017-04-12 LAB — INSULIN, FASTING: INSULIN FASTING, SERUM: 3.7 u[IU]/mL (ref 2.0–19.6)

## 2017-04-13 LAB — METHYLMALONIC ACID, SERUM: Methylmalonic Acid, Quant: 115 nmol/L (ref 87–318)

## 2017-06-01 ENCOUNTER — Ambulatory Visit
Admission: RE | Admit: 2017-06-01 | Discharge: 2017-06-01 | Disposition: A | Discharge status: Home / Self Care | Payer: Medicare Other | Source: Ambulatory Visit | Attending: Family Medicine | Admitting: Family Medicine

## 2017-06-01 DIAGNOSIS — Z1231 Encounter for screening mammogram for malignant neoplasm of breast: Secondary | ICD-10-CM | POA: Diagnosis not present

## 2017-06-01 DIAGNOSIS — E2839 Other primary ovarian failure: Secondary | ICD-10-CM

## 2017-06-01 DIAGNOSIS — M810 Age-related osteoporosis without current pathological fracture: Secondary | ICD-10-CM | POA: Diagnosis not present

## 2017-06-01 DIAGNOSIS — Z780 Asymptomatic menopausal state: Secondary | ICD-10-CM | POA: Diagnosis not present

## 2017-06-02 ENCOUNTER — Telehealth: Payer: Self-pay

## 2017-06-02 NOTE — Telephone Encounter (Signed)
Left message for patient to call us back regarding Bone Density results.

## 2017-06-02 NOTE — Telephone Encounter (Signed)
-----   Message from Steele Sizer, MD sent at 06/02/2017  2:13 PM EDT ----- She has osteoporosis of spine , but is stable when compared with bone density done two years ago , but hip is worse, she was osteopenic and now has osteoporosis on her femur.  Consider medication for osteoporosis Mammogram is normal

## 2017-07-03 NOTE — Telephone Encounter (Signed)
AWV completed °

## 2017-10-11 ENCOUNTER — Ambulatory Visit: Payer: Medicare Other | Admitting: Family Medicine

## 2017-10-18 ENCOUNTER — Ambulatory Visit (INDEPENDENT_AMBULATORY_CARE_PROVIDER_SITE_OTHER): Payer: Medicare Other | Admitting: Family Medicine

## 2017-10-18 ENCOUNTER — Encounter: Payer: Self-pay | Admitting: Family Medicine

## 2017-10-18 VITALS — BP 140/82 | HR 66 | Temp 97.8°F | Resp 16 | Ht 62.0 in | Wt 165.0 lb

## 2017-10-18 DIAGNOSIS — R739 Hyperglycemia, unspecified: Secondary | ICD-10-CM

## 2017-10-18 DIAGNOSIS — M545 Low back pain, unspecified: Secondary | ICD-10-CM

## 2017-10-18 DIAGNOSIS — E785 Hyperlipidemia, unspecified: Secondary | ICD-10-CM | POA: Diagnosis not present

## 2017-10-18 DIAGNOSIS — R030 Elevated blood-pressure reading, without diagnosis of hypertension: Secondary | ICD-10-CM

## 2017-10-18 DIAGNOSIS — I700 Atherosclerosis of aorta: Secondary | ICD-10-CM

## 2017-10-18 DIAGNOSIS — M1711 Unilateral primary osteoarthritis, right knee: Secondary | ICD-10-CM

## 2017-10-18 DIAGNOSIS — M810 Age-related osteoporosis without current pathological fracture: Secondary | ICD-10-CM

## 2017-10-18 DIAGNOSIS — E559 Vitamin D deficiency, unspecified: Secondary | ICD-10-CM | POA: Diagnosis not present

## 2017-10-18 DIAGNOSIS — Z789 Other specified health status: Secondary | ICD-10-CM | POA: Diagnosis not present

## 2017-10-18 NOTE — Patient Instructions (Signed)
Evolocumab injection What is this medicine? EVOLOCUMAB (e voe LOK ue mab) is known as a PCSK9 inhibitor. It is used to lower the level of cholesterol in the blood. It may be used alone or in combination with other cholesterol-lowering drugs. This drug may also be used to reduce the risk of heart attack, stroke, and certain types of heart surgery in patients with heart disease. This medicine may be used for other purposes; ask your health care provider or pharmacist if you have questions. COMMON BRAND NAME(S): REPATHA What should I tell my health care provider before I take this medicine? They need to know if you have any of these conditions: -an unusual or allergic reaction to evolocumab, other medicines, foods, dyes, or preservatives -pregnant or trying to get pregnant -breast-feeding How should I use this medicine? This medicine is for injection under the skin. You will be taught how to prepare and give this medicine. Use exactly as directed. Take your medicine at regular intervals. Do not take your medicine more often than directed. It is important that you put your used needles and syringes in a special sharps container. Do not put them in a trash can. If you do not have a sharps container, call your pharmacist or health care provider to get one. Talk to your pediatrician regarding the use of this medicine in children. While this drug may be prescribed for children as young as 13 years for selected conditions, precautions do apply. Overdosage: If you think you have taken too much of this medicine contact a poison control center or emergency room at once. NOTE: This medicine is only for you. Do not share this medicine with others. What if I miss a dose? If you miss a dose, take it as soon as you can if there are more than 7 days until the next scheduled dose, or skip the missed dose and take the next dose according to your original schedule. Do not take double or extra doses. What may interact  with this medicine? Interactions are not expected. This list may not describe all possible interactions. Give your health care provider a list of all the medicines, herbs, non-prescription drugs, or dietary supplements you use. Also tell them if you smoke, drink alcohol, or use illegal drugs. Some items may interact with your medicine. What should I watch for while using this medicine? You may need blood work while you are taking this medicine. What side effects may I notice from receiving this medicine? Side effects that you should report to your doctor or health care professional as soon as possible: -allergic reactions like skin rash, itching or hives, swelling of the face, lips, or tongue -signs and symptoms of infection like fever or chills; cough; sore throat; pain or trouble passing urine Side effects that usually do not require medical attention (report to your doctor or health care professional if they continue or are bothersome): -diarrhea -nausea -muscle pain -pain, redness, or irritation at site where injected This list may not describe all possible side effects. Call your doctor for medical advice about side effects. You may report side effects to FDA at 1-800-FDA-1088. Where should I keep my medicine? Keep out of the reach of children. You will be instructed on how to store this medicine. Throw away any unused medicine after the expiration date on the label. NOTE: This sheet is a summary. It may not cover all possible information. If you have questions about this medicine, talk to your doctor, pharmacist, or health care   provider.  2018 Elsevier/Gold Standard (2016-09-05 13:21:53)  

## 2017-10-18 NOTE — Progress Notes (Signed)
Name: Paula Mills   MRN: [id number]    DOB: 10/11/1945   Date:10/18/2017       Progress Note  Subjective  Chief Complaint  Chief Complaint  Patient presents with  . Medication Refill    6 month F/U    HPI  Back Pain: Pt pulled her back while at the gym 55months ago and back is still stiff in the mornings, but loosens up throughout the day.   She has osteoporosis and is willing to have x-ray today. Discussed modifying some activity.   Hyperlipidemia: LDL is very high, but she does not want to take statins, and does not want to try any other class of medication, she has carotid atherosclerosis advised to at least take 81 mg aspirin  Metabolic syndrome: we will check labs, she denies polyphagia, polydipsia or polyuria  Osteoporosis: discussed importance of considering medication  Vegetarian : she has a healthy diet, she is trying to avoid carbohydrates.    Patient Active Problem List   Diagnosis Date Noted  . Screen for colon cancer   . Benign neoplasm of ascending colon   . Polyp of sigmoid colon   . Carotid artery calcification, bilateral 10/18/2016  . At risk for falling 04/08/2016  . H/O: HTN (hypertension) 04/08/2016  . Hypo-ovarianism 04/08/2016  . Asymptomatic varicose veins 04/08/2016  . Avitaminosis D 04/08/2016  . Hiatal hernia 02/09/2016  . Liver cyst 02/09/2016  . Lumbar spondylosis 02/09/2016  . Osteoporosis 02/09/2016  . Dyslipidemia 02/09/2016  . Vegetarian 02/09/2016  . Vitamin D deficiency 02/09/2016  . Calcification of abdominal aorta (HCC) 02/09/2016  . History of small bowel obstruction 03/12/2015    Past Surgical History:  Procedure Laterality Date  . ABDOMINAL HYSTERECTOMY    . APPENDECTOMY N/A 03/14/2015   Procedure: APPENDECTOMY;  Surgeon: Molly Maduro, MD;  Location: [facility] ORS;  Service: General;  Laterality: N/A;  . CESAREAN SECTION    . COLONOSCOPY WITH PROPOFOL N/A 01/31/2017   Procedure: COLONOSCOPY WITH PROPOFOL;  Surgeon: Lucilla Lame, MD;  Location: [facility] ENDOSCOPY;  Service: Endoscopy;  Laterality: N/A;  . LAPAROTOMY N/A 03/14/2015   Procedure: EXPLORATORY LAPAROTOMY;  Surgeon: Molly Maduro, MD;  Location: [facility] ORS;  Service: General;  Laterality: N/A;    Family History  Problem Relation Age of Onset  . Hyperlipidemia Mother   . Hypertension Mother   . Multiple sclerosis Father   . Multiple sclerosis Sister     Social History   Socioeconomic History  . Marital status: Married    Spouse name: Not on file  . Number of children: Not on file  . Years of education: Not on file  . Highest education level: Not on file  Social Needs  . Financial resource strain: Not on file  . Food insecurity - worry: Not on file  . Food insecurity - inability: Not on file  . Transportation needs - medical: Not on file  . Transportation needs - non-medical: Not on file  Occupational History  . Not on file  Tobacco Use  . Smoking status: Never Smoker  . Smokeless tobacco: Never Used  Substance and Sexual Activity  . Alcohol use: Yes    Alcohol/week: 0.0 oz    Comment: 1-2 glasses of wine a month  . Drug use: No  . Sexual activity: Yes    Partners: Male  Other Topics Concern  . Not on file  Social History Narrative  . Not on file     Current Outpatient Medications:  .  aspirin EC 81 MG tablet, Take 1 tablet (81 mg total) by mouth daily., Disp: 30 tablet, Rfl: 0 .  Calcium Carbonate-Vit D-Min (CALCIUM 600+D PLUS MINERALS) 600-400 MG-UNIT TABS, Take by mouth., Disp: , Rfl:  .  Cholecalciferol (VITAMIN D) 2000 units CAPS, Take 2,000 capsules by mouth daily. , Disp: , Rfl:  .  Multiple Vitamins-Minerals (WOMENS MULTIVITAMIN PO), Take 1 capsule by mouth daily. , Disp: , Rfl:   No Known Allergies   ROS  Constitutional: Negative for fever or weight change.  Respiratory: Negative for cough and shortness of breath.   Cardiovascular: Negative for chest pain or palpitations.  Gastrointestinal: Negative for abdominal  pain, no bowel changes.  Musculoskeletal: Negative for gait problem or joint swelling.  Skin: Negative for rash.  Neurological: Negative for dizziness or headache.  No other specific complaints in a complete review of systems (except as listed in HPI above).   Objective  Vitals:   10/18/17 1425  BP: 140/82  Pulse: 66  Resp: 16  Temp: 97.8 F (36.6 C)  TempSrc: Oral  SpO2: 96%  Weight: 165 lb (74.8 kg)  Height: 5\' 2"  (1.575 m)    Body mass index is 30.18 kg/m.  Physical Exam   Constitutional: Patient appears well-developed and well-nourished. Obese  No distress.  HEENT: head atraumatic, normocephalic, pupils equal and reactive to light,  neck supple, throat within normal limits Cardiovascular: Normal rate, regular rhythm and normal heart sounds.  No murmur heard. No BLE edema. Pulmonary/Chest: Effort normal and breath sounds normal. No respiratory distress. Abdominal: Soft.  There is no tenderness. Psychiatric: Patient has a normal mood and affect. behavior is normal. Judgment and thought content normal.   PHQ2/9: Depression screen [facility] 2/9 10/18/2017 03/20/2017 03/20/2017 02/09/2017 02/09/2016  Decreased Interest 0 0 0 0 0  Down, Depressed, Hopeless 0 0 0 0 0  PHQ - 2 Score 0 0 0 0 0  Altered sleeping - 0 - - -  Tired, decreased energy - 0 - - -  Change in appetite - 0 - - -  Feeling bad or failure about yourself  - 0 - - -  Trouble concentrating - 0 - - -  Moving slowly or fidgety/restless - 0 - - -  Suicidal thoughts - 0 - - -  PHQ-9 Score - 0 - - -     Fall Risk: Fall Risk  10/18/2017 03/20/2017 02/09/2017 02/09/2016  Falls in the past year? No Yes Yes No  Number falls in past yr: - 1 1 -  Comment - vertigo - -  Injury with Fall? - No No -  Follow up - Falls prevention discussed - -     Functional Status Survey: Is the patient deaf or have difficulty hearing?: No Does the patient have difficulty seeing, even when wearing glasses/contacts?: No Does the patient  have difficulty concentrating, remembering, or making decisions?: No Does the patient have difficulty walking or climbing stairs?: No Does the patient have difficulty dressing or bathing?: No Does the patient have difficulty doing errands alone such as visiting a doctor's office or shopping?: No    Assessment & Plan  1. Dyslipidemia  - Lipid panel Refuses medication  2. Age-related osteoporosis without current pathological fracture   3. Primary osteoarthritis of right knee   4. Vegetarian   5. Calcification of abdominal aorta (HCC)  Refuses medication   6. Vitamin D deficiency   7. Elevated blood pressure reading  - COMPLETE METABOLIC PANEL WITH GFR  8.  Hyperglycemia  - Hemoglobin A1c  9. Lumbar pain  - DG Lumbar Spine Complete; Future

## 2017-10-19 LAB — COMPLETE METABOLIC PANEL WITH GFR
AG Ratio: 1.4 (calc) (ref 1.0–2.5)
ALKALINE PHOSPHATASE (APISO): 81 U/L (ref 33–130)
ALT: 15 U/L (ref 6–29)
AST: 21 U/L (ref 10–35)
Albumin: 4.2 g/dL (ref 3.6–5.1)
BILIRUBIN TOTAL: 0.4 mg/dL (ref 0.2–1.2)
BUN: 11 mg/dL (ref 7–25)
CHLORIDE: 103 mmol/L (ref 98–110)
CO2: 27 mmol/L (ref 20–32)
CREATININE: 0.67 mg/dL (ref 0.60–0.93)
Calcium: 9.5 mg/dL (ref 8.6–10.4)
GFR, Est African American: 102 mL/min/{1.73_m2} (ref >=60)
GFR, Est Non African American: 88 mL/min/{1.73_m2} (ref >=60)
GLUCOSE: 85 mg/dL (ref 65–99)
Globulin: 3 g/dL (calc) (ref 1.9–3.7)
Potassium: 3.9 mmol/L (ref 3.5–5.3)
Sodium: 139 mmol/L (ref 135–146)
Total Protein: 7.2 g/dL (ref 6.1–8.1)

## 2017-10-19 LAB — HEMOGLOBIN A1C
HEMOGLOBIN A1C: 5.6 %{Hb} (ref <5.7)
MEAN PLASMA GLUCOSE: 114 (calc)
eAG (mmol/L): 6.3 (calc)

## 2017-10-19 LAB — LIPID PANEL
Cholesterol: 291 mg/dL — ABNORMAL HIGH (ref <200)
HDL: 74 mg/dL (ref >50)
LDL Cholesterol (Calc): 200 mg/dL (calc) — ABNORMAL HIGH
NON-HDL CHOLESTEROL (CALC): 217 mg/dL — AB (ref <130)
Total CHOL/HDL Ratio: 3.9 (calc) (ref <5.0)
Triglycerides: 66 mg/dL (ref <150)

## 2017-10-30 ENCOUNTER — Ambulatory Visit
Admission: RE | Admit: 2017-10-30 | Discharge: 2017-10-30 | Disposition: A | Discharge status: Home / Self Care | Payer: Medicare Other | Source: Ambulatory Visit | Attending: Family Medicine | Admitting: Family Medicine

## 2017-10-30 DIAGNOSIS — M810 Age-related osteoporosis without current pathological fracture: Secondary | ICD-10-CM | POA: Insufficient documentation

## 2017-10-30 DIAGNOSIS — M545 Low back pain, unspecified: Secondary | ICD-10-CM

## 2017-10-30 DIAGNOSIS — M47896 Other spondylosis, lumbar region: Secondary | ICD-10-CM | POA: Insufficient documentation

## 2017-10-31 ENCOUNTER — Telehealth: Payer: Self-pay | Admitting: Family Medicine

## 2017-10-31 NOTE — Telephone Encounter (Signed)
Pt given results per notes of Dr. Ancil Boozer on 10/31/17, patient verbalized understanding, unable to document in result note due to result note not being routed to [facility].

## 2018-02-04 IMAGING — CR DG LUMBAR SPINE COMPLETE 4+V
1 series · 6 of 6 positions shown · non-contrast
Comparison: None.

CLINICAL DATA: Back pain since July 2017.

EXAM:
LUMBAR SPINE - COMPLETE 4+ VIEW

[Series 1: dg lumbar spine complete 4 +v · 0.14mm/px · 6 of 6 slices shown]
[im 1/6]
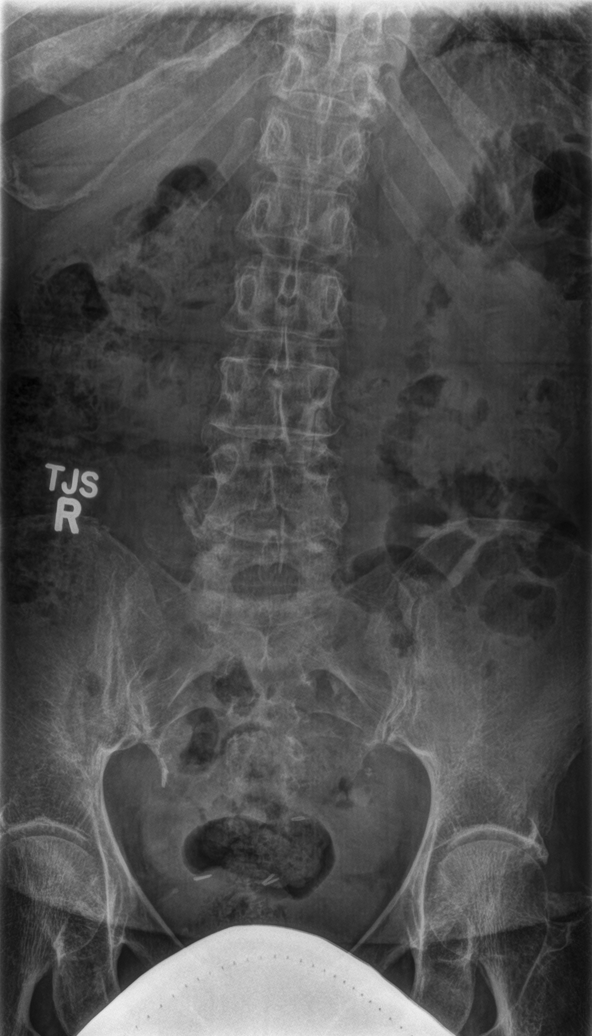
[im 2/6]
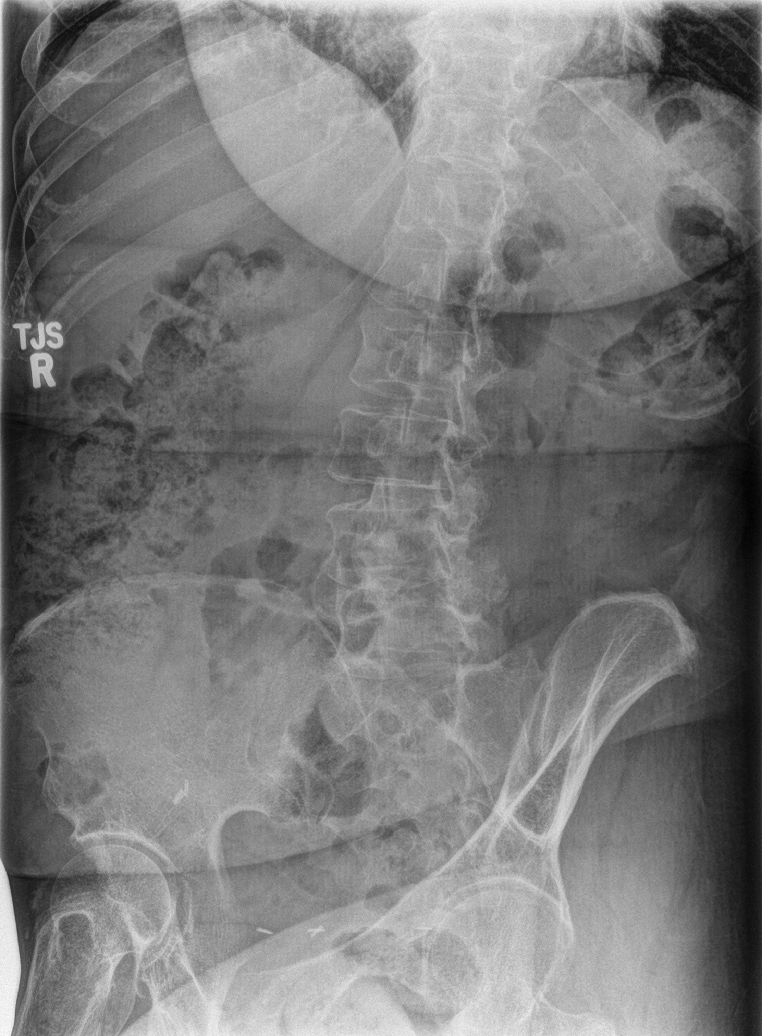
[im 3/6]
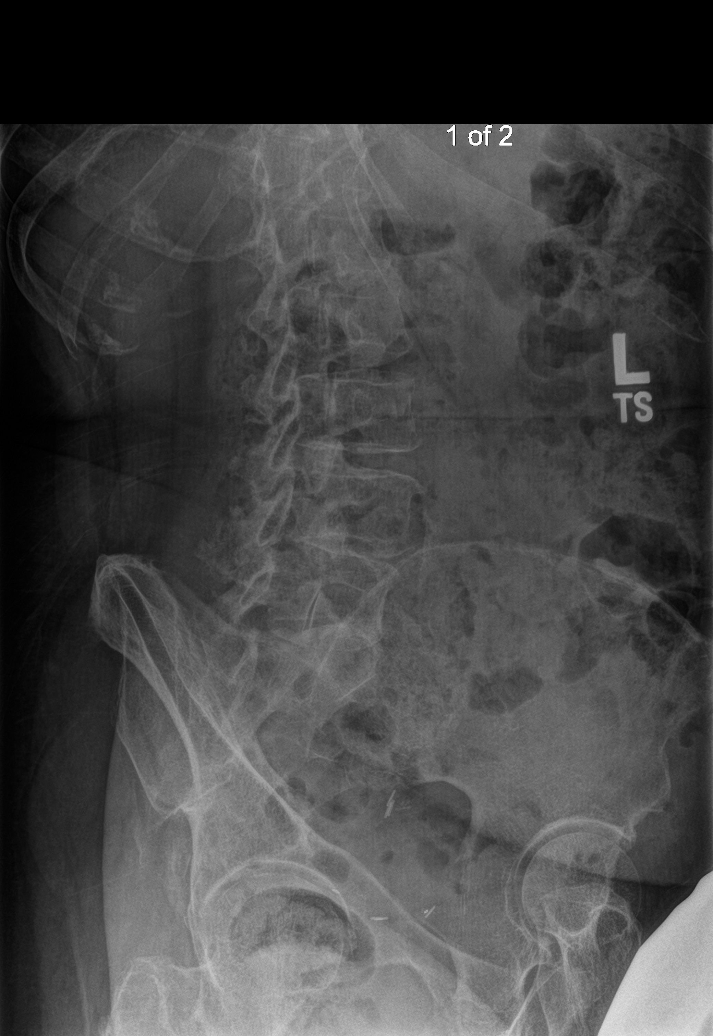
[im 4/6]
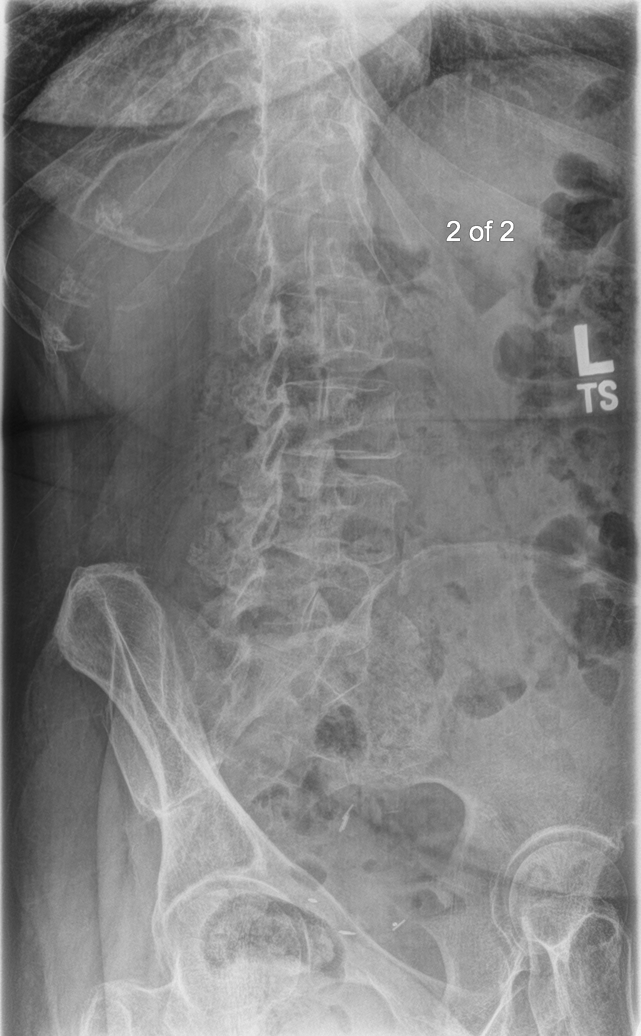
[im 5/6]
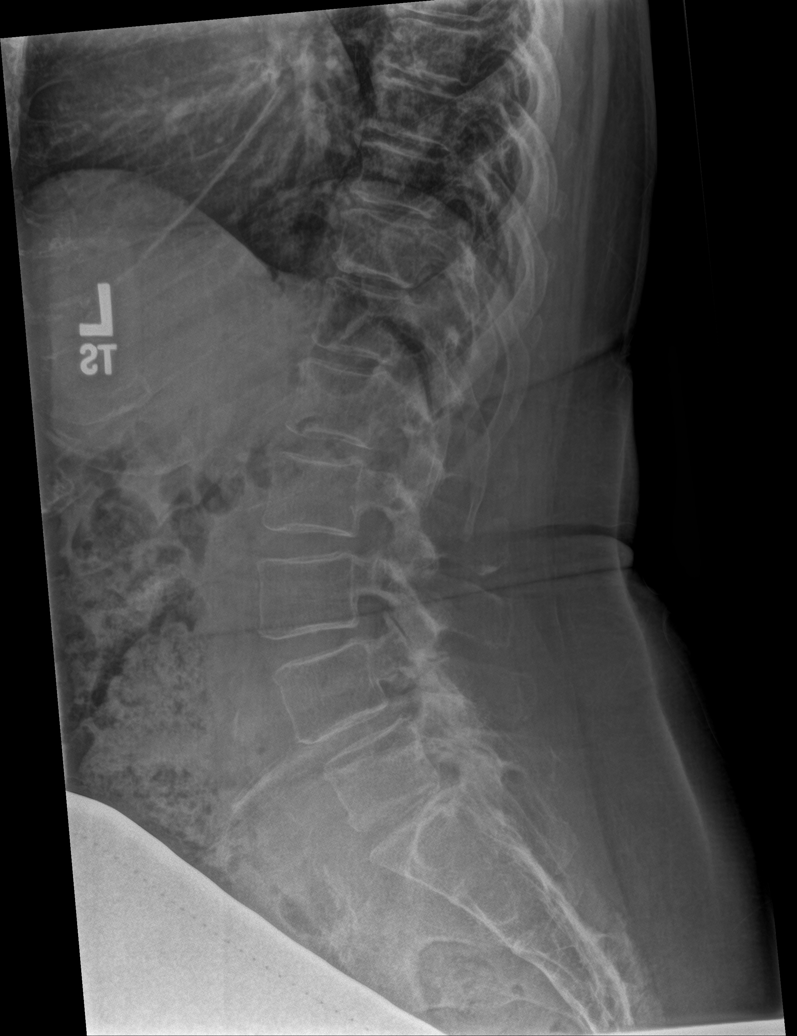
[im 6/6]
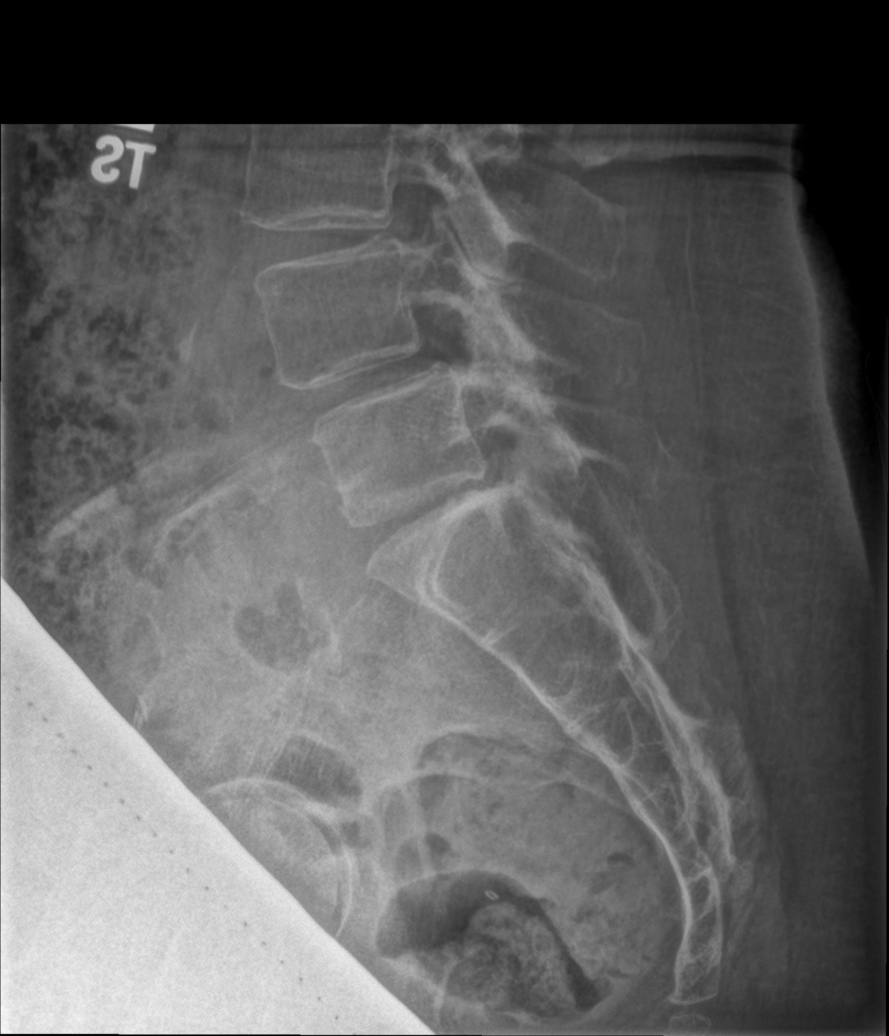

[6 of 6 positions shown; findings below may reference images not displayed]

FINDINGS: Mild degenerative anterolisthesis of L4 due to advanced facet
disease. The other vertebral bodies are normally aligned. Disc
spaces are fairly well maintained. Mild disc space narrowing at
L5-S1. No acute bony findings or destructive bony changes. The
visualized bony pelvis is intact.
IMPRESSION: No acute bony findings. Mild degenerative changes. Moderate facet
disease in the lower lumbar spine.

## 2018-03-23 ENCOUNTER — Ambulatory Visit (INDEPENDENT_AMBULATORY_CARE_PROVIDER_SITE_OTHER): Payer: Medicare Other

## 2018-03-23 VITALS — BP 128/72 | HR 72 | Temp 97.6°F | Resp 12 | Ht 62.0 in | Wt 161.7 lb

## 2018-03-23 DIAGNOSIS — Z0000 Encounter for general adult medical examination without abnormal findings: Secondary | ICD-10-CM | POA: Diagnosis not present

## 2018-03-23 NOTE — Patient Instructions (Signed)
[name] , Thank you for taking time to come for your Medicare Wellness Visit. I appreciate your ongoing commitment to your health goals. Please review the following plan we discussed and let me know if I can assist you in the future.   Screening recommendations/referrals: Colorectal Screening: Up to date Mammogram: Up to date Bone Density: Up to date Lung Cancer Screening: You do not qualify for this screening Hepatitis C Screening: Up to date  Vision and Dental Exams: Recommended annual ophthalmology exams for early detection of glaucoma and other disorders of the eye Recommended annual dental exams for proper oral hygiene  Vaccinations: Influenza vaccine: Overdue Pneumococcal vaccine: Declined Tdap vaccine: Declined. Please call your insurance company to determine your out of pocket expense. You may also receive this vaccine at your local pharmacy or Health Dept. Shingles vaccine: Please call your insurance company to determine your out of pocket expense for the Shingrix vaccine. You may also receive this vaccine at your local pharmacy or Health Dept.  Advanced directives: Please bring a copy of your POA (Power of Attorney) and/or Living Will to your next appointment.  Goals: Recommend to drink at least 6-8 8oz glasses of water per day.  Next appointment: Please schedule your Annual Wellness Visit with your Nurse Health Advisor in one year.  Preventive Care 29 Years and Older, Female Preventive care refers to lifestyle choices and visits with your health care provider that can promote health and wellness. What does preventive care include?  A yearly physical exam. This is also called an annual well check.  Dental exams once or twice a year.  Routine eye exams. Ask your health care provider how often you should have your eyes checked.  Personal lifestyle choices, including:  Daily care of your teeth and gums.  Regular physical activity.  Eating a healthy  diet.  Avoiding tobacco and drug use.  Limiting alcohol use.  Practicing safe sex.  Taking low-dose aspirin every day.  Taking vitamin and mineral supplements as recommended by your health care provider. What happens during an annual well check? The services and screenings done by your health care provider during your annual well check will depend on your age, overall health, lifestyle risk factors, and family history of disease. Counseling  Your health care provider may ask you questions about your:  Alcohol use.  Tobacco use.  Drug use.  Emotional well-being.  Home and relationship well-being.  Sexual activity.  Eating habits.  History of falls.  Memory and ability to understand (cognition).  Work and work Statistician.  Reproductive health. Screening  You may have the following tests or measurements:  Height, weight, and BMI.  Blood pressure.  Lipid and cholesterol levels. These may be checked every 5 years, or more frequently if you are over 30 years old.  Skin check.  Lung cancer screening. You may have this screening every year starting at age 66 if you have a 30-pack-year history of smoking and currently smoke or have quit within the past 15 years.  Fecal occult blood test (FOBT) of the stool. You may have this test every year starting at age 81.  Flexible sigmoidoscopy or colonoscopy. You may have a sigmoidoscopy every 5 years or a colonoscopy every 10 years starting at age 76.  Hepatitis C blood test.  Hepatitis B blood test.  Sexually transmitted disease (STD) testing.  Diabetes screening. This is done by checking your blood sugar (glucose) after you have not eaten for a while (fasting). You may have  this done every 1-3 years.  Bone density scan. This is done to screen for osteoporosis. You may have this done starting at age 73.  Mammogram. This may be done every 1-2 years. Talk to your health care provider about how often you should have  regular mammograms. Talk with your health care provider about your test results, treatment options, and if necessary, the need for more tests. Vaccines  Your health care provider may recommend certain vaccines, such as:  Influenza vaccine. This is recommended every year.  Tetanus, diphtheria, and acellular pertussis (Tdap, Td) vaccine. You may need a Td booster every 10 years.  Zoster vaccine. You may need this after age 13.  Pneumococcal 13-valent conjugate (PCV13) vaccine. One dose is recommended after age 96.  Pneumococcal polysaccharide (PPSV23) vaccine. One dose is recommended after age 54. Talk to your health care provider about which screenings and vaccines you need and how often you need them. This information is not intended to replace advice given to you by your health care provider. Make sure you discuss any questions you have with your health care provider. Document Released: 10/16/2015 Document Revised: 06/08/2016 Document Reviewed: 07/21/2015 Elsevier Interactive Patient Education  2017 [place] Prevention in the Home Falls can cause injuries. They can happen to people of all ages. There are many things you can do to make your home safe and to help prevent falls. What can I do on the outside of my home?  Regularly fix the edges of walkways and driveways and fix any cracks.  Remove anything that might make you trip as you walk through a door, such as a raised step or threshold.  Trim any bushes or trees on the path to your home.  Use bright outdoor lighting.  Clear any walking paths of anything that might make someone trip, such as rocks or tools.  Regularly check to see if handrails are loose or broken. Make sure that both sides of any steps have handrails.  Any raised decks and porches should have guardrails on the edges.  Have any leaves, snow, or ice cleared regularly.  Use sand or salt on walking paths during winter.  Clean up any spills in your  garage right away. This includes oil or grease spills. What can I do in the bathroom?  Use night lights.  Install grab bars by the toilet and in the tub and shower. Do not use towel bars as grab bars.  Use non-skid mats or decals in the tub or shower.  If you need to sit down in the shower, use a plastic, non-slip stool.  Keep the floor dry. Clean up any water that spills on the floor as soon as it happens.  Remove soap buildup in the tub or shower regularly.  Attach bath mats securely with double-sided non-slip rug tape.  Do not have throw rugs and other things on the floor that can make you trip. What can I do in the bedroom?  Use night lights.  Make sure that you have a light by your bed that is easy to reach.  Do not use any sheets or blankets that are too big for your bed. They should not hang down onto the floor.  Have a firm chair that has side arms. You can use this for support while you get dressed.  Do not have throw rugs and other things on the floor that can make you trip. What can I do in the kitchen?  Clean up any  spills right away.  Avoid walking on wet floors.  Keep items that you use a lot in easy-to-reach places.  If you need to reach something above you, use a strong step stool that has a grab bar.  Keep electrical cords out of the way.  Do not use floor polish or wax that makes floors slippery. If you must use wax, use non-skid floor wax.  Do not have throw rugs and other things on the floor that can make you trip. What can I do with my stairs?  Do not leave any items on the stairs.  Make sure that there are handrails on both sides of the stairs and use them. Fix handrails that are broken or loose. Make sure that handrails are as long as the stairways.  Check any carpeting to make sure that it is firmly attached to the stairs. Fix any carpet that is loose or worn.  Avoid having throw rugs at the top or bottom of the stairs. If you do have throw  rugs, attach them to the floor with carpet tape.  Make sure that you have a light switch at the top of the stairs and the bottom of the stairs. If you do not have them, ask someone to add them for you. What else can I do to help prevent falls?  Wear shoes that:  Do not have high heels.  Have rubber bottoms.  Are comfortable and fit you well.  Are closed at the toe. Do not wear sandals.  If you use a stepladder:  Make sure that it is fully opened. Do not climb a closed stepladder.  Make sure that both sides of the stepladder are locked into place.  Ask someone to hold it for you, if possible.  Clearly mark and make sure that you can see:  Any grab bars or handrails.  First and last steps.  Where the edge of each step is.  Use tools that help you move around (mobility aids) if they are needed. These include:  Canes.  Walkers.  Scooters.  Crutches.  Turn on the lights when you go into a dark area. Replace any light bulbs as soon as they burn out.  Set up your furniture so you have a clear path. Avoid moving your furniture around.  If any of your floors are uneven, fix them.  If there are any pets around you, be aware of where they are.  Review your medicines with your doctor. Some medicines can make you feel dizzy. This can increase your chance of falling. Ask your doctor what other things that you can do to help prevent falls. This information is not intended to replace advice given to you by your health care provider. Make sure you discuss any questions you have with your health care provider. Document Released: 07/16/2009 Document Revised: 02/25/2016 Document Reviewed: 10/24/2014 Elsevier Interactive Patient Education  2017 [employer].

## 2018-03-23 NOTE — Progress Notes (Addendum)
Subjective:   Paula Mills is a 72 y.o. female who presents for Medicare Annual (Subsequent) preventive examination.  Review of Systems:  N/A Cardiac Risk Factors include: advanced age (>83men, >15 women);dyslipidemia     Objective:     Vitals: BP 128/72 (BP Location: Left Arm, Patient Position: Sitting, Cuff Size: Normal)   Pulse 72   Temp 97.6 F (36.4 C) (Oral)   Resp 12   Ht 5\' 2"  (1.575 m)   Wt 161 lb 11.2 oz (73.3 kg)   SpO2 94%   BMI 29.58 kg/m   Body mass index is 29.58 kg/m.  Advanced Directives 03/23/2018 03/20/2017 02/09/2017 10/18/2016 10/14/2016 10/05/2016 02/09/2016  Does Patient Have a Medical Advance Directive? Yes Yes Yes Yes Yes Yes Yes  Type of Paramedic of [place];Living will Living will [place];Living will [place];Living will [place];Living will [place];Living will [place];Living will  Does patient want to make changes to medical advance directive? - - - - - - No - Patient declined  Copy of [place] in Chart? No - copy requested - No - copy requested - - - No - copy requested  Would patient like information on creating a medical advance directive? - - - - - - No - patient declined information    Tobacco Social History   Tobacco Use  Smoking Status Never Smoker  Smokeless Tobacco Never Used  Tobacco Comment   smoking cessation materials not required     Counseling given: No Comment: smoking cessation materials not required  Clinical Intake:  Pre-visit preparation completed: Yes  Pain : No/denies pain   BMI - recorded: 29.58 Nutritional Status: BMI > 30  Obese Nutritional Risks: None Diabetes: No  How often do you need to have someone help you when you read instructions, pamphlets, or other written materials from your doctor or pharmacy?: 1 - Never  Interpreter Needed?: No  Information entered  by :: AEversole, LPN  Past Medical History:  Diagnosis Date  . Hyperlipidemia    Past Surgical History:  Procedure Laterality Date  . ABDOMINAL HYSTERECTOMY    . APPENDECTOMY N/A 03/14/2015   Procedure: APPENDECTOMY;  Surgeon: Molly Maduro, MD;  Location: [facility] ORS;  Service: General;  Laterality: N/A;  . CESAREAN SECTION    . COLONOSCOPY WITH PROPOFOL N/A 01/31/2017   Procedure: COLONOSCOPY WITH PROPOFOL;  Surgeon: Lucilla Lame, MD;  Location: [facility] ENDOSCOPY;  Service: Endoscopy;  Laterality: N/A;  . LAPAROTOMY N/A 03/14/2015   Procedure: EXPLORATORY LAPAROTOMY;  Surgeon: Molly Maduro, MD;  Location: [facility] ORS;  Service: General;  Laterality: N/A;   Family History  Problem Relation Age of Onset  . Hyperlipidemia Mother   . Hypertension Mother   . Multiple sclerosis Father   . Hypertension Father   . Hypertension Sister   . Hyperlipidemia Sister   . Multiple sclerosis Sister    Social History   Socioeconomic History  . Marital status: Married    Spouse name: Winferd Humphrey  . Number of children: 2  . Years of education: Not on file  . Highest education level: Master's degree (e.g., MA, MS, MEng, MEd, MSW, MBA)  Occupational History  . Occupation: Retired  Scientific laboratory technician  . Financial resource strain: Not hard at all  . Food insecurity:    Worry: Never true    Inability: Never true  . Transportation needs:    Medical: No  Non-medical: No  Tobacco Use  . Smoking status: Never Smoker  . Smokeless tobacco: Never Used  . Tobacco comment: smoking cessation materials not required  Substance and Sexual Activity  . Alcohol use: Not Currently    Alcohol/week: 0.0 oz  . Drug use: No  . Sexual activity: Not Currently    Partners: Male  Lifestyle  . Physical activity:    Days per week: 5 days    Minutes per session: 150+ min  . Stress: Not at all  Relationships  . Social connections:    Talks on phone: Patient refused    Gets together: Patient refused    Attends  religious service: Patient refused    Active member of club or organization: Patient refused    Attends meetings of clubs or organizations: Patient refused    Relationship status: Married  Other Topics Concern  . Not on file  Social History Narrative  . Not on file    Outpatient Encounter Medications as of 03/23/2018  Medication Sig  . aspirin EC 81 MG tablet Take 1 tablet (81 mg total) by mouth daily.  . Calcium Carbonate-Vit D-Min (CALCIUM 600+D PLUS MINERALS) 600-400 MG-UNIT TABS Take by mouth.  . Cholecalciferol (VITAMIN D) 2000 units CAPS Take 2,000 capsules by mouth daily.   . Multiple Vitamins-Minerals (WOMENS MULTIVITAMIN PO) Take 1 capsule by mouth daily.    No facility-administered encounter medications on file as of 03/23/2018.     Activities of Daily Living In your present state of health, do you have any difficulty performing the following activities: 03/23/2018 10/18/2017  Hearing? N N  Comment denies hearing aids -  Vision? N N  Comment wears eyeglasses -  Difficulty concentrating or making decisions? N N  Walking or climbing stairs? Y N  Comment R knee pain -  Dressing or bathing? N N  Doing errands, shopping? N N  Preparing Food and eating ? N -  Comment denies dentures -  Using the Toilet? N -  In the past six months, have you accidently leaked urine? N -  Do you have problems with loss of bowel control? N -  Managing your Medications? N -  Managing your Finances? N -  Housekeeping or managing your Housekeeping? N -  Some recent data might be hidden    Patient Care Team: Steele Sizer, MD as PCP - General (Family Medicine)    Assessment:   This is a routine wellness examination for [facility].  Exercise Activities and Dietary recommendations Current Exercise Habits: Structured exercise class, Type of exercise: strength training/weights;yoga;Other - see comments(swimming), Time (Minutes): > 60, Frequency (Times/Week): 5, Weekly Exercise (Minutes/Week): 0,  Intensity: Mild, Exercise limited by: None identified  Goals    . DIET - INCREASE WATER INTAKE     Recommend to drink at least 6-8 8oz glasses of water per day.       Fall Risk Fall Risk  03/23/2018 10/18/2017 03/20/2017 02/09/2017 02/09/2016  Falls in the past year? No No Yes Yes No  Number falls in past yr: - - 1 1 -  Comment - - vertigo - -  Injury with Fall? - - No No -  Risk for fall due to : Impaired vision;Other (Comment) - - - -  Risk for fall due to: Comment vertigo, wears eyeglasses - - - -  Follow up - - Falls prevention discussed - -   FALL RISK PREVENTION PERTAINING TO HOME: Is your home free of loose throw rugs in walkways, pet  beds, electrical cords, etc? Yes Is there adequate lighting in your home to reduce risk of falls?  Yes Are there stairs in or around your home WITH handrails? Yes  ASSISTIVE DEVICES UTILIZED TO PREVENT FALLS: Use of a cane, walker or w/c? No Grab bars in the bathroom? No  Shower chair or a place to sit while bathing? Yes An elevated toilet seat or a handicapped toilet? Yes  Timed Get Up and Go Performed: Yes. Pt ambulated 10 feet within 7 sec. Gait stead-fast and without the use of an assistive device. No intervention required at this time. Fall risk prevention has been discussed.  Community Resource Referral:  Pt declined my offer to send [employer] Referral to Care Guide for installation of grab bars in the shower.  Depression Screen PHQ 2/9 Scores 03/23/2018 10/18/2017 03/20/2017 03/20/2017  PHQ - 2 Score 0 0 0 0  PHQ- 9 Score 0 - 0 -     Cognitive Function     6CIT Screen 03/23/2018 03/20/2017  What Year? 0 points 0 points  What month? 0 points 0 points  What time? 0 points 0 points  Count back from 20 0 points 0 points  Months in reverse 0 points 0 points  Repeat phrase 2 points 6 points  Total Score 2 6    Immunization History  Administered Date(s) Administered  . Influenza, Seasonal, Injecte, Preservative Fre 10/01/2013     Qualifies for Shingles Vaccine? Yes. Due for Shingrix. Education has been provided regarding the importance of this vaccine. Pt has been advised to call her insurance company to determine her out of pocket expense. Advised she may also receive this vaccine at her local pharmacy or Health Dept. Verbalized acceptance and understanding.  Overdue for Flu vaccine. Education has been provided regarding the importance of this vaccine and advised to receive when available. Verbalized acceptance and understanding.  Due for Pneumoccocal vaccine. Declined my offer to administer today. Education has been provided regarding the importance of this vaccine but still declined. Pt has been advised to call our office if she should change her mind and wish to receive this vaccine. Also advised she may receive this vaccine at her local pharmacy or Health Dept. Pt is aware to provide a copy of her vaccination record if she chooses to receive this vaccine at her local pharmacy. Verbalized acceptance and understanding.  Due for Tdap vaccine. Education has been provided regarding the importance of this vaccine. Pt has been advised she may receive this vaccine at her local pharmacy or Health Dept. Also advised to provide a copy of her vaccination record if she chooses to receive this vaccine at her local pharmacy. Verbalized acceptance and understanding.  Screening Tests Health Maintenance  Topic Date Due  . INFLUENZA VACCINE  05/10/2018 (Originally 05/03/2018)  . PNA vac Low Risk Adult (1 of 2 - PCV13) 09/26/2019 (Originally 08/20/2010)  . TETANUS/TDAP  10/03/2026 (Originally 10/03/2016)  . MAMMOGRAM  06/01/2018  . COLONOSCOPY  01/31/2022  . DEXA SCAN  Completed  . Hepatitis C Screening  Completed    Cancer Screenings: Lung: Low Dose CT Chest recommended if Age 4-80 years, 30 pack-year currently smoking OR have quit w/in 15years. Patient does not qualify. Breast:  Up to date on Mammogram? Yes. Completed 06/02/17.  Declined my offer to order this screening and to schedule repeat mammogram after 06/02/18. States she prefers to repeat mammograms every 2 years.   Up to date of Bone Density/Dexa? Yes. Completed 06/01/17. Osteoporotic screening no  longer required Colorectal: Completed 01/31/17. Repeat every 5 years  Additional Screenings: Hepatitis C Screening: Completed 02/19/13    Plan:    I have personally reviewed and addressed the Medicare Annual Wellness questionnaire and have noted the following in the patient's chart:  A. Medical and social history B. Use of alcohol, tobacco or illicit drugs  C. Current medications and supplements D. Functional ability and status E.  Nutritional status F.  Physical activity G. Advance directives H. List of other physicians I.  Hospitalizations, surgeries, and ER visits in previous 12 months J.  [place] such as hearing and vision if needed, cognitive and depression L. Referrals and appointments  In addition, I have reviewed and discussed with patient certain preventive protocols, quality metrics, and best practice recommendations. A written personalized care plan for preventive services as well as general preventive health recommendations were provided to patient.  See attached scanned questionnaire for additional information.   Signed,  Aleatha Borer, LPN Nurse Health Advisor   I have reviewed this encounter including the documentation in this note and/or discussed this patient with the provider, Aleatha Borer, LPN. I am certifying that I agree with the content of this note as supervising physician.  Steele Sizer, MD [facility] 03/23/2018, 4:24 PM

## 2018-03-26 DIAGNOSIS — R42 Dizziness and giddiness: Secondary | ICD-10-CM | POA: Diagnosis not present

## 2018-03-28 ENCOUNTER — Ambulatory Visit (INDEPENDENT_AMBULATORY_CARE_PROVIDER_SITE_OTHER): Payer: Medicare Other | Admitting: Family Medicine

## 2018-03-28 ENCOUNTER — Encounter: Payer: Self-pay | Admitting: Family Medicine

## 2018-03-28 VITALS — BP 124/72 | HR 98 | Temp 97.6°F | Resp 18 | Ht 62.0 in | Wt 161.9 lb

## 2018-03-28 DIAGNOSIS — I700 Atherosclerosis of aorta: Secondary | ICD-10-CM

## 2018-03-28 DIAGNOSIS — M810 Age-related osteoporosis without current pathological fracture: Secondary | ICD-10-CM | POA: Diagnosis not present

## 2018-03-28 DIAGNOSIS — E785 Hyperlipidemia, unspecified: Secondary | ICD-10-CM | POA: Diagnosis not present

## 2018-03-28 DIAGNOSIS — I6523 Occlusion and stenosis of bilateral carotid arteries: Secondary | ICD-10-CM

## 2018-03-28 DIAGNOSIS — Z7982 Long term (current) use of aspirin: Secondary | ICD-10-CM | POA: Diagnosis not present

## 2018-03-28 DIAGNOSIS — E559 Vitamin D deficiency, unspecified: Secondary | ICD-10-CM | POA: Diagnosis not present

## 2018-03-28 DIAGNOSIS — M25561 Pain in right knee: Secondary | ICD-10-CM

## 2018-03-28 DIAGNOSIS — Z789 Other specified health status: Secondary | ICD-10-CM

## 2018-03-28 DIAGNOSIS — M1711 Unilateral primary osteoarthritis, right knee: Secondary | ICD-10-CM | POA: Diagnosis not present

## 2018-03-28 DIAGNOSIS — R42 Dizziness and giddiness: Secondary | ICD-10-CM | POA: Diagnosis not present

## 2018-03-28 NOTE — Progress Notes (Signed)
Name: Paula Mills   MRN: [id number]    DOB: 1946-06-12   Date:03/28/2018       Progress Note  Subjective  Chief Complaint  Chief Complaint  Patient presents with  . Medication Refill    6 month F/U  . Hyperlipidemia  . Osteoporosis  . Foot Problem    Right Foot-3rd right toe. Patient states she wears alot of shoes and notices her second to biggest toe goes underneath her 3rd toe-denies any pain or trauma to the area    HPI  Hyperlipidemia:LDL is very high, but she does not want to take statins, and does not want to try any other class of medication, she has carotid atherosclerosis advised to at least take 81 mg aspirin, her ASCVD without aspirin is 1818% and risk of bleeding is low , below 3%   Metabolic syndrome: follows a vegetarian diet, she denies polyphagia, polydipsia or polyuria  Osteoporosis: discussed importance of considering medication, she continues to refuse starting medication, discussed all options, including Forteo, Prolia and bisphosphonate.   Vegetarian : she has a healthy diet, she is trying to avoid carbohydrates.  Joint stiffiness also right knee pain: started one year ago, she modified her activity at the gym, swimming and doing strength training  but avoiding spin classes . X-ray showed mild OA, worse on right medial compartment of right knee where she has the worse pain. Pain is not daily and takes Tylenol prn. It improves symptoms. Also using topical medication   Vertigo: woke up Monday am the room was spinning, she called ENT and had Epley maneuver, and is doing well since she had procedure done. It was on right side.   Patient Active Problem List   Diagnosis Date Noted  . Screen for colon cancer   . Benign neoplasm of ascending colon   . Polyp of sigmoid colon   . Carotid artery calcification, bilateral 10/18/2016  . At risk for falling 04/08/2016  . H/O: HTN (hypertension) 04/08/2016  . Hypo-ovarianism 04/08/2016  . Asymptomatic varicose  veins 04/08/2016  . Avitaminosis D 04/08/2016  . Hiatal hernia 02/09/2016  . Liver cyst 02/09/2016  . Lumbar spondylosis 02/09/2016  . Osteoporosis 02/09/2016  . Dyslipidemia 02/09/2016  . Vegetarian 02/09/2016  . Vitamin D deficiency 02/09/2016  . Calcification of abdominal aorta (HCC) 02/09/2016  . History of small bowel obstruction 03/12/2015    Past Surgical History:  Procedure Laterality Date  . ABDOMINAL HYSTERECTOMY    . APPENDECTOMY N/A 03/14/2015   Procedure: APPENDECTOMY;  Surgeon: Molly Maduro, MD;  Location: [facility];  Service: General;  Laterality: N/A;  . CESAREAN SECTION    . COLONOSCOPY WITH PROPOFOL N/A 01/31/2017   Procedure: COLONOSCOPY WITH PROPOFOL;  Surgeon: Lucilla Lame, MD;  Location: [facility];  Service: Endoscopy;  Laterality: N/A;  . LAPAROTOMY N/A 03/14/2015   Procedure: EXPLORATORY LAPAROTOMY;  Surgeon: Molly Maduro, MD;  Location: [facility];  Service: General;  Laterality: N/A;    Family History  Problem Relation Age of Onset  . Hyperlipidemia Mother   . Hypertension Mother   . Multiple sclerosis Father   . Hypertension Father   . Hypertension Sister   . Hyperlipidemia Sister   . Multiple sclerosis Sister     Social History   Socioeconomic History  . Marital status: Married    Spouse name: Winferd Humphrey  . Number of children: 2  . Years of education: Not on file  . Highest education level: Master's degree (e.g., MA, MS, MEng, MEd, MSW,  MBA)  Occupational History  . Occupation: Retired  Scientific laboratory technician  . Financial resource strain: Not hard at all  . Food insecurity:    Worry: Never true    Inability: Never true  . Transportation needs:    Medical: No    Non-medical: No  Tobacco Use  . Smoking status: Never Smoker  . Smokeless tobacco: Never Used  . Tobacco comment: smoking cessation materials not required  Substance and Sexual Activity  . Alcohol use: Not Currently    Alcohol/week: 0.0 oz  . Drug use: No  . Sexual activity:  Not Currently    Partners: Male  Lifestyle  . Physical activity:    Days per week: 5 days    Minutes per session: 150+ min  . Stress: Not at all  Relationships  . Social connections:    Talks on phone: Patient refused    Gets together: Patient refused    Attends religious service: Patient refused    Active member of club or organization: Patient refused    Attends meetings of clubs or organizations: Patient refused    Relationship status: Married  . Intimate partner violence:    Fear of current or ex partner: No    Emotionally abused: No    Physically abused: No    Forced sexual activity: No  Other Topics Concern  . Not on file  Social History Narrative  . Not on file     Current Outpatient Medications:  .  aspirin EC 81 MG tablet, Take 1 tablet (81 mg total) by mouth daily., Disp: 30 tablet, Rfl: 0 .  Cholecalciferol (VITAMIN D) 2000 units CAPS, Take 2,000 capsules by mouth daily. , Disp: , Rfl:  .  Multiple Vitamins-Minerals (WOMENS MULTIVITAMIN PO), Take 1 capsule by mouth daily. , Disp: , Rfl:  .  vitamin B-12 (CYANOCOBALAMIN) 500 MCG tablet, Take 1,000 mcg by mouth daily., Disp: , Rfl:  .  Calcium Carbonate-Vit D-Min (CALCIUM 600+D PLUS MINERALS) 600-400 MG-UNIT TABS, Take by mouth., Disp: , Rfl:   No Known Allergies   ROS  Constitutional: Negative for fever or weight change.  Respiratory: Negative for cough and shortness of breath.   Cardiovascular: Negative for chest pain or palpitations.  Gastrointestinal: Negative for abdominal pain, no bowel changes.  Musculoskeletal: Negative for gait problem or joint swelling.  Skin: Negative for rash.  Neurological: Negative for dizziness ( but had another episode of vertigo this week, went to ENT and resolved)  or headache.  No other specific complaints in a complete review of systems (except as listed in HPI above).  Objective  Vitals:   03/28/18 1436  BP: 124/72  Pulse: 98  Resp: 18  Temp: 97.6 F (36.4 C)   TempSrc: Oral  SpO2: 96%  Weight: 161 lb 14.4 oz (73.4 kg)  Height: 5\' 2"  (1.575 m)    Body mass index is 29.61 kg/m.  Physical Exam  Constitutional: Patient appears well-developed and well-nourished. Obese  No distress.  HEENT: head atraumatic, normocephalic, pupils equal and reactive to light,  neck supple, throat within normal limits Cardiovascular: Normal rate, regular rhythm and normal heart sounds.  No murmur heard. No BLE edema. Pulmonary/Chest: Effort normal and breath sounds normal. No respiratory distress. Abdominal: Soft.  There is no tenderness. Psychiatric: Patient has a normal mood and affect. behavior is normal. Judgment and thought content normal.    PHQ2/9: Depression screen [facility] 2/9 03/23/2018 10/18/2017 03/20/2017 03/20/2017 02/09/2017  Decreased Interest 0 0 0 0 0  Down,  Depressed, Hopeless 0 0 0 0 0  PHQ - 2 Score 0 0 0 0 0  Altered sleeping 0 - 0 - -  Tired, decreased energy 0 - 0 - -  Change in appetite 0 - 0 - -  Feeling bad or failure about yourself  0 - 0 - -  Trouble concentrating 0 - 0 - -  Moving slowly or fidgety/restless 0 - 0 - -  Suicidal thoughts 0 - 0 - -  PHQ-9 Score 0 - 0 - -  Difficult doing work/chores Not difficult at all - - - -     Fall Risk: Fall Risk  03/23/2018 10/18/2017 03/20/2017 02/09/2017 02/09/2016  Falls in the past year? No No Yes Yes No  Number falls in past yr: - - 1 1 -  Comment - - vertigo - -  Injury with Fall? - - No No -  Risk for fall due to : Impaired vision;Other (Comment) - - - -  Risk for fall due to: Comment vertigo, wears eyeglasses - - - -  Follow up - - Falls prevention discussed - -   Functional Status Survey: Is the patient deaf or have difficulty hearing?: No Does the patient have difficulty seeing, even when wearing glasses/contacts?: Yes Does the patient have difficulty concentrating, remembering, or making decisions?: No Does the patient have difficulty walking or climbing stairs?: No Does the patient  have difficulty dressing or bathing?: No Does the patient have difficulty doing errands alone such as visiting a doctor's office or shopping?: No    Assessment & Plan  1. Dyslipidemia  - Lipid panel  2. Age-related osteoporosis without current pathological fracture  Refused medication  - CBC with Differential/Platelet - Comprehensive metabolic panel - VITAMIN D 25 Hydroxy (Vit-D Deficiency, Fractures)  3. Primary osteoarthritis of right knee   4. Vitamin D deficiency  - VITAMIN D 25 Hydroxy (Vit-D Deficiency, Fractures)  5. Calcification of abdominal aorta (HCC)  Refused statin but will resume taking Aspirin 81 mg   6. Vegetarian  - CBC with Differential/Platelet  7. Right medial knee pain  From OA  8. Carotid artery calcification, bilateral   9. Long term (current) use of aspirin  - CBC with Differential/Platelet  10. Vertigo  Resolved

## 2018-03-29 DIAGNOSIS — R42 Dizziness and giddiness: Secondary | ICD-10-CM | POA: Diagnosis not present

## 2018-03-29 LAB — COMPREHENSIVE METABOLIC PANEL
AG Ratio: 1.1 (calc) (ref 1.0–2.5)
ALKALINE PHOSPHATASE (APISO): 87 U/L (ref 33–130)
ALT: 18 U/L (ref 6–29)
AST: 21 U/L (ref 10–35)
Albumin: 4.1 g/dL (ref 3.6–5.1)
BILIRUBIN TOTAL: 0.4 mg/dL (ref 0.2–1.2)
BUN: 11 mg/dL (ref 7–25)
CALCIUM: 9.9 mg/dL (ref 8.6–10.4)
CHLORIDE: 102 mmol/L (ref 98–110)
CO2: 28 mmol/L (ref 20–32)
Creat: 0.74 mg/dL (ref 0.60–0.93)
GLOBULIN: 3.6 g/dL (ref 1.9–3.7)
Glucose, Bld: 89 mg/dL (ref 65–139)
POTASSIUM: 3.8 mmol/L (ref 3.5–5.3)
SODIUM: 138 mmol/L (ref 135–146)
TOTAL PROTEIN: 7.7 g/dL (ref 6.1–8.1)

## 2018-03-29 LAB — CBC WITH DIFFERENTIAL/PLATELET
BASOS ABS: 33 {cells}/uL (ref 0–200)
Basophils Relative: 0.5 %
EOS ABS: 143 {cells}/uL (ref 15–500)
EOS PCT: 2.2 %
HCT: 38.1 % (ref 35.0–45.0)
Hemoglobin: 12.6 g/dL (ref 11.7–15.5)
Lymphs Abs: 1567 cells/uL (ref 850–3900)
MCH: 27.3 pg (ref 27.0–33.0)
MCHC: 33.1 g/dL (ref 32.0–36.0)
MCV: 82.5 fL (ref 80.0–100.0)
MONOS PCT: 7.8 %
MPV: 10.9 fL (ref 7.5–12.5)
NEUTROS PCT: 65.4 %
Neutro Abs: 4251 cells/uL (ref 1500–7800)
PLATELETS: 268 10*3/uL (ref 140–400)
RBC: 4.62 10*6/uL (ref 3.80–5.10)
RDW: 13.5 % (ref 11.0–15.0)
TOTAL LYMPHOCYTE: 24.1 %
WBC mixed population: 507 cells/uL (ref 200–950)
WBC: 6.5 10*3/uL (ref 3.8–10.8)

## 2018-03-29 LAB — LIPID PANEL
CHOLESTEROL: 309 mg/dL — AB (ref <200)
HDL: 80 mg/dL (ref >50)
LDL Cholesterol (Calc): 208 mg/dL (calc) — ABNORMAL HIGH
Non-HDL Cholesterol (Calc): 229 mg/dL (calc) — ABNORMAL HIGH (ref <130)
Total CHOL/HDL Ratio: 3.9 (calc) (ref <5.0)
Triglycerides: 93 mg/dL (ref <150)

## 2018-03-29 LAB — VITAMIN D 25 HYDROXY (VIT D DEFICIENCY, FRACTURES): Vit D, 25-Hydroxy: 35 ng/mL (ref 30–100)

## 2018-06-29 ENCOUNTER — Telehealth: Payer: Self-pay | Admitting: Family Medicine

## 2018-06-29 DIAGNOSIS — M1711 Unilateral primary osteoarthritis, right knee: Secondary | ICD-10-CM

## 2018-06-29 DIAGNOSIS — M25561 Pain in right knee: Secondary | ICD-10-CM

## 2018-06-29 NOTE — Telephone Encounter (Signed)
Copied from [place] [id number]. Topic: General - Other >> Jun 29, 2018 10:30 AM Keene Breath wrote: Reason for CRM: Patient called to request a referral to see Dr. Jefm Bryant in [facility] for her knee pain.  Please advise.  CB# [id number].

## 2018-06-29 NOTE — Telephone Encounter (Signed)
Referral has been placed as requested.

## 2018-07-03 DIAGNOSIS — M1711 Unilateral primary osteoarthritis, right knee: Secondary | ICD-10-CM | POA: Diagnosis not present

## 2018-12-03 ENCOUNTER — Ambulatory Visit (INDEPENDENT_AMBULATORY_CARE_PROVIDER_SITE_OTHER): Payer: Medicare Other | Admitting: Nurse Practitioner

## 2018-12-03 ENCOUNTER — Ambulatory Visit
Admission: RE | Admit: 2018-12-03 | Discharge: 2018-12-03 | Disposition: A | Discharge status: Home / Self Care | Payer: Medicare Other | Attending: Nurse Practitioner | Admitting: Nurse Practitioner

## 2018-12-03 ENCOUNTER — Ambulatory Visit
Admission: RE | Admit: 2018-12-03 | Discharge: 2018-12-03 | Disposition: A | Discharge status: Home / Self Care | Payer: Medicare Other | Source: Ambulatory Visit | Attending: Nurse Practitioner | Admitting: Nurse Practitioner

## 2018-12-03 ENCOUNTER — Encounter: Payer: Self-pay | Admitting: Nurse Practitioner

## 2018-12-03 ENCOUNTER — Other Ambulatory Visit: Payer: Self-pay

## 2018-12-03 VITALS — BP 128/82 | HR 82 | Temp 99.1°F | Resp 14 | Ht 62.0 in

## 2018-12-03 DIAGNOSIS — R062 Wheezing: Secondary | ICD-10-CM | POA: Insufficient documentation

## 2018-12-03 DIAGNOSIS — R05 Cough: Secondary | ICD-10-CM | POA: Diagnosis not present

## 2018-12-03 DIAGNOSIS — R059 Cough, unspecified: Secondary | ICD-10-CM

## 2018-12-03 MED ORDER — DM-GUAIFENESIN ER 30-600 MG PO TB12: 1.0000 | ORAL_TABLET | Freq: Two times a day (BID) | ORAL | 1 refills | Status: DC

## 2018-12-03 NOTE — Patient Instructions (Addendum)
-   Please get chest xray across the street  - Please take mucinex-DM twice a day to help cough up sputum - Rest, drink plenty of water, good nutrition- diet with plenty of vitamin C and A and protein.  - Practice good hand hygiene.   You likely have a viral upper respiratory infection (URI). Antibiotics will not reduce the number of days you are ill or prevent you from getting bacterial rhinosinusitis. A URI can take up to 14 days to resolve, but typically last between 7-11 days. Your body is so smart and strong that it will be fighting this illness off for you but it is important that you drink plenty of fluids, rest. Cover your nose/mouth when you cough or sneeze and wash your hands well and often. Here are some helpful things you can use or pick up over the counter from the pharmacy to help with your symptoms:   For Fever/Pain: Acetaminophen every 6 hours as needed (maximum of 3000mg  a day). If you are still uncomfortable you can add ibuprofen OR naproxen  For coughing: try dextromethorphan for a cough suppressant, and/or a cool mist humidifier, lozenges  For sore throat: saline gargles, honey herbal tea, lozenges, throat spray  To dry out your nose: try an antihistamine like loratadine (non-sedating) or diphenhydramine (sedating) or others To relieve a stuffy nose: try an oral decongestant   neti pot To make blowing your nose easier and relieve chest congestion: guaifenesin 400mg  every 4-6 hours of guaifenesin ER [phone] mg every 12 hours. Do not take more than 2,400mg  a day.

## 2018-12-03 NOTE — Progress Notes (Signed)
Name: Paula Mills   MRN: [id number]    DOB: [birth year]   Date:12/03/2018       Progress Note  Subjective  Chief Complaint  Chief Complaint  Patient presents with  . URI    patient presents with cold sx for the past 3 days. SX: dry cough, wheezing, weakness. OTC: Nyquil    HPI  Patient presents for cough, nasal congestion, sinus pressure, mild ear fullness, fatigue and felt some chest congestion and like she was wheezy started on Thursday was going to come in on Friday but didn't make it.  Denies shortness of breath, chest pain, fevers or chills.  States today feels much better. Has been taking nyquil with temporary relief.    Patient Active Problem List   Diagnosis Date Noted  . Screen for colon cancer   . Benign neoplasm of ascending colon   . Polyp of sigmoid colon   . Carotid artery calcification, bilateral 10/18/2016  . At risk for falling 04/08/2016  . H/O: HTN (hypertension) 04/08/2016  . Hypo-ovarianism 04/08/2016  . Asymptomatic varicose veins 04/08/2016  . Avitaminosis D 04/08/2016  . Hiatal hernia 02/09/2016  . Liver cyst 02/09/2016  . Lumbar spondylosis 02/09/2016  . Osteoporosis 02/09/2016  . Dyslipidemia 02/09/2016  . Vegetarian 02/09/2016  . Vitamin D deficiency 02/09/2016  . Calcification of abdominal aorta (HCC) 02/09/2016  . History of small bowel obstruction 03/12/2015    Past Medical History:  Diagnosis Date  . Hyperlipidemia     Past Surgical History:  Procedure Laterality Date  . ABDOMINAL HYSTERECTOMY    . APPENDECTOMY N/A 03/14/2015   Procedure: APPENDECTOMY;  Surgeon: Molly Maduro, MD;  Location: [facility];  Service: General;  Laterality: N/A;  . CESAREAN SECTION    . COLONOSCOPY WITH PROPOFOL N/A 01/31/2017   Procedure: COLONOSCOPY WITH PROPOFOL;  Surgeon: Lucilla Lame, MD;  Location: [facility];  Service: Endoscopy;  Laterality: N/A;  . LAPAROTOMY N/A 03/14/2015   Procedure: EXPLORATORY LAPAROTOMY;  Surgeon: Molly Maduro, MD;   Location: [facility];  Service: General;  Laterality: N/A;    Social History   Tobacco Use  . Smoking status: Never Smoker  . Smokeless tobacco: Never Used  . Tobacco comment: smoking cessation materials not required  Substance Use Topics  . Alcohol use: Not Currently    Alcohol/week: 0.0 standard drinks     Current Outpatient Medications:  .  Calcium Carbonate-Vit D-Min (CALCIUM 600+D PLUS MINERALS) 600-400 MG-UNIT TABS, Take by mouth., Disp: , Rfl:  .  Cholecalciferol (VITAMIN D) 2000 units CAPS, Take 2,000 capsules by mouth daily. , Disp: , Rfl:  .  Multiple Vitamins-Minerals (WOMENS MULTIVITAMIN PO), Take 1 capsule by mouth daily. , Disp: , Rfl:  .  vitamin B-12 (CYANOCOBALAMIN) 500 MCG tablet, Take 1,000 mcg by mouth daily., Disp: , Rfl:  .  aspirin EC 81 MG tablet, Take 1 tablet (81 mg total) by mouth daily. (Patient not taking: Reported on 12/03/2018), Disp: 30 tablet, Rfl: 0  No Known Allergies  ROS  No other specific complaints in a complete review of systems (except as listed in HPI above).  Objective  Vitals:   12/03/18 1217  BP: 128/82  Pulse: 82  Resp: 14  Temp: 99.1 F (37.3 C)  TempSrc: Oral  SpO2: 96%  Height: 5\' 2"  (1.575 m)     Body mass index is 29.61 kg/m.  Nursing Note and Vital Signs reviewed.  Physical Exam HENT:     Head: Normocephalic and atraumatic.  Right Ear: Hearing, tympanic membrane, ear canal and external ear normal.     Left Ear: Hearing, tympanic membrane, ear canal and external ear normal.     Nose: Nose normal.     Right Sinus: No maxillary sinus tenderness or frontal sinus tenderness.     Left Sinus: No maxillary sinus tenderness or frontal sinus tenderness.     Mouth/Throat:     Mouth: Mucous membranes are moist.     Pharynx: Uvula midline. No oropharyngeal exudate or posterior oropharyngeal erythema.  Eyes:     General:        Right eye: No discharge.        Left eye: No discharge.     Conjunctiva/sclera:  Conjunctivae normal.  Neck:     Musculoskeletal: Normal range of motion.  Cardiovascular:     Rate and Rhythm: Normal rate.  Pulmonary:     Effort: Pulmonary effort is normal.     Breath sounds: Wheezing (mild wheezing bilateral upper lobes) present.  Lymphadenopathy:     Cervical: No cervical adenopathy.  Skin:    General: Skin is warm and dry.     Findings: No rash.  Neurological:     Mental Status: She is alert.  Psychiatric:        Judgment: Judgment normal.       No results found for this or any previous visit (from the past 48 hour(s)).  Assessment & Plan  1. Cough - DG Chest 2 View; Future - dextromethorphan-guaiFENesin (MUCINEX DM) 30-600 MG 12hr tablet; Take 1 tablet by mouth 2 (two) times daily.  Dispense: 30 tablet; Refill: 1  2. Wheezing - DG Chest 2 View; Future  -Red flags and when to present for emergency care or RTC including fever >101.58F, chest pain, shortness of breath, new/worsening/un-resolving symptoms,  reviewed with patient at time of visit. Follow up and care instructions discussed and provided in AVS.

## 2018-12-04 ENCOUNTER — Other Ambulatory Visit: Payer: Self-pay | Admitting: Nurse Practitioner

## 2018-12-04 DIAGNOSIS — J189 Pneumonia, unspecified organism: Secondary | ICD-10-CM

## 2018-12-04 DIAGNOSIS — J181 Lobar pneumonia, unspecified organism: Principal | ICD-10-CM

## 2018-12-04 MED ORDER — DOXYCYCLINE HYCLATE 100 MG PO TABS: 100.0000 mg | ORAL_TABLET | Freq: Two times a day (BID) | ORAL | 0 refills | Status: DC

## 2018-12-05 ENCOUNTER — Telehealth: Payer: Self-pay | Admitting: Nurse Practitioner

## 2018-12-05 NOTE — Telephone Encounter (Signed)
Attempted to reach patient twice without success today. Informed husband to have her call back for results as he is not on DPR.

## 2019-03-28 ENCOUNTER — Other Ambulatory Visit: Payer: Self-pay

## 2019-03-28 ENCOUNTER — Ambulatory Visit (INDEPENDENT_AMBULATORY_CARE_PROVIDER_SITE_OTHER): Payer: Medicare Other

## 2019-03-28 DIAGNOSIS — Z780 Asymptomatic menopausal state: Secondary | ICD-10-CM | POA: Diagnosis not present

## 2019-03-28 DIAGNOSIS — M810 Age-related osteoporosis without current pathological fracture: Secondary | ICD-10-CM | POA: Diagnosis not present

## 2019-03-28 DIAGNOSIS — Z0000 Encounter for general adult medical examination without abnormal findings: Secondary | ICD-10-CM

## 2019-03-28 DIAGNOSIS — Z1231 Encounter for screening mammogram for malignant neoplasm of breast: Secondary | ICD-10-CM | POA: Diagnosis not present

## 2019-03-28 NOTE — Patient Instructions (Signed)
Paula Mills , Thank you for taking time to come for your Medicare Wellness Visit. I appreciate your ongoing commitment to your health goals. Please review the following plan we discussed and let me know if I can assist you in the future.   Screening recommendations/referrals: Colonoscopy: done 01/31/17. Repeat in 2023. Mammogram: done 06/01/17. Please call [phone] to schedule your mammogram and bone density screening.  Bone Density: done 06/01/17 Recommended yearly ophthalmology/optometry visit for glaucoma screening and checkup Recommended yearly dental visit for hygiene and checkup  Vaccinations: Influenza vaccine: postponed Pneumococcal vaccine: postponed Tdap vaccine: due - please contact us if you get a cut or scrape Shingles vaccine: Shingrix discussed. Please contact your pharmacy for coverage information.   Advanced directives: Please bring a copy of your health care power of attorney and living will to the office at your convenience.  Conditions/risks identified: Keep up the great work!  Next appointment: Please follow up in one year for your Medicare Annual Wellness visit.     Preventive Care 74 Years and Older, Female Preventive care refers to lifestyle choices and visits with your health care provider that can promote health and wellness. What does preventive care include?  A yearly physical exam. This is also called an annual well check.  Dental exams once or twice a year.  Routine eye exams. Ask your health care provider how often you should have your eyes checked.  Personal lifestyle choices, including:  Daily care of your teeth and gums.  Regular physical activity.  Eating a healthy diet.  Avoiding tobacco and drug use.  Limiting alcohol use.  Practicing safe sex.  Taking low-dose aspirin every day.  Taking vitamin and mineral supplements as recommended by your health care provider. What happens during an annual well check? The services and  screenings done by your health care provider during your annual well check will depend on your age, overall health, lifestyle risk factors, and family history of disease. Counseling  Your health care provider may ask you questions about your:  Alcohol use.  Tobacco use.  Drug use.  Emotional well-being.  Home and relationship well-being.  Sexual activity.  Eating habits.  History of falls.  Memory and ability to understand (cognition).  Work and work Statistician.  Reproductive health. Screening  You may have the following tests or measurements:  Height, weight, and BMI.  Blood pressure.  Lipid and cholesterol levels. These may be checked every 5 years, or more frequently if you are over 31 years old.  Skin check.  Lung cancer screening. You may have this screening every year starting at age 74 if you have a 30-pack-year history of smoking and currently smoke or have quit within the past 15 years.  Fecal occult blood test (FOBT) of the stool. You may have this test every year starting at age 12.  Flexible sigmoidoscopy or colonoscopy. You may have a sigmoidoscopy every 5 years or a colonoscopy every 10 years starting at age 74.  Hepatitis C blood test.  Hepatitis B blood test.  Sexually transmitted disease (STD) testing.  Diabetes screening. This is done by checking your blood sugar (glucose) after you have not eaten for a while (fasting). You may have this done every 1-3 years.  Bone density scan. This is done to screen for osteoporosis. You may have this done starting at age 74.  Mammogram. This may be done every 1-2 years. Talk to your health care provider about how often you should have regular mammograms. Talk with your  health care provider about your test results, treatment options, and if necessary, the need for more tests. Vaccines  Your health care provider may recommend certain vaccines, such as:  Influenza vaccine. This is recommended every year.   Tetanus, diphtheria, and acellular pertussis (Tdap, Td) vaccine. You may need a Td booster every 10 years.  Zoster vaccine. You may need this after age 74.  Pneumococcal 13-valent conjugate (PCV13) vaccine. One dose is recommended after age 74.  Pneumococcal polysaccharide (PPSV23) vaccine. One dose is recommended after age 74. Talk to your health care provider about which screenings and vaccines you need and how often you need them. This information is not intended to replace advice given to you by your health care provider. Make sure you discuss any questions you have with your health care provider. Document Released: 10/16/2015 Document Revised: 06/08/2016 Document Reviewed: 07/21/2015 Elsevier Interactive Patient Education  2017 [place] Prevention in the Home Falls can cause injuries. They can happen to people of all ages. There are many things you can do to make your home safe and to help prevent falls. What can I do on the outside of my home?  Regularly fix the edges of walkways and driveways and fix any cracks.  Remove anything that might make you trip as you walk through a door, such as a raised step or threshold.  Trim any bushes or trees on the path to your home.  Use bright outdoor lighting.  Clear any walking paths of anything that might make someone trip, such as rocks or tools.  Regularly check to see if handrails are loose or broken. Make sure that both sides of any steps have handrails.  Any raised decks and porches should have guardrails on the edges.  Have any leaves, snow, or ice cleared regularly.  Use sand or salt on walking paths during winter.  Clean up any spills in your garage right away. This includes oil or grease spills. What can I do in the bathroom?  Use night lights.  Install grab bars by the toilet and in the tub and shower. Do not use towel bars as grab bars.  Use non-skid mats or decals in the tub or shower.  If you need to sit  down in the shower, use a plastic, non-slip stool.  Keep the floor dry. Clean up any water that spills on the floor as soon as it happens.  Remove soap buildup in the tub or shower regularly.  Attach bath mats securely with double-sided non-slip rug tape.  Do not have throw rugs and other things on the floor that can make you trip. What can I do in the bedroom?  Use night lights.  Make sure that you have a light by your bed that is easy to reach.  Do not use any sheets or blankets that are too big for your bed. They should not hang down onto the floor.  Have a firm chair that has side arms. You can use this for support while you get dressed.  Do not have throw rugs and other things on the floor that can make you trip. What can I do in the kitchen?  Clean up any spills right away.  Avoid walking on wet floors.  Keep items that you use a lot in easy-to-reach places.  If you need to reach something above you, use a strong step stool that has a grab bar.  Keep electrical cords out of the way.  Do not use  floor polish or wax that makes floors slippery. If you must use wax, use non-skid floor wax.  Do not have throw rugs and other things on the floor that can make you trip. What can I do with my stairs?  Do not leave any items on the stairs.  Make sure that there are handrails on both sides of the stairs and use them. Fix handrails that are broken or loose. Make sure that handrails are as long as the stairways.  Check any carpeting to make sure that it is firmly attached to the stairs. Fix any carpet that is loose or worn.  Avoid having throw rugs at the top or bottom of the stairs. If you do have throw rugs, attach them to the floor with carpet tape.  Make sure that you have a light switch at the top of the stairs and the bottom of the stairs. If you do not have them, ask someone to add them for you. What else can I do to help prevent falls?  Wear shoes that:  Do not have  high heels.  Have rubber bottoms.  Are comfortable and fit you well.  Are closed at the toe. Do not wear sandals.  If you use a stepladder:  Make sure that it is fully opened. Do not climb a closed stepladder.  Make sure that both sides of the stepladder are locked into place.  Ask someone to hold it for you, if possible.  Clearly mark and make sure that you can see:  Any grab bars or handrails.  First and last steps.  Where the edge of each step is.  Use tools that help you move around (mobility aids) if they are needed. These include:  Canes.  Walkers.  Scooters.  Crutches.  Turn on the lights when you go into a dark area. Replace any light bulbs as soon as they burn out.  Set up your furniture so you have a clear path. Avoid moving your furniture around.  If any of your floors are uneven, fix them.  If there are any pets around you, be aware of where they are.  Review your medicines with your doctor. Some medicines can make you feel dizzy. This can increase your chance of falling. Ask your doctor what other things that you can do to help prevent falls. This information is not intended to replace advice given to you by your health care provider. Make sure you discuss any questions you have with your health care provider. Document Released: 07/16/2009 Document Revised: 02/25/2016 Document Reviewed: 10/24/2014 Elsevier Interactive Patient Education  2017 [employer].

## 2019-03-28 NOTE — Progress Notes (Signed)
Subjective:   Paula Mills is a 73 y.o. female who presents for Medicare Annual (Subsequent) preventive examination.  Virtual Visit via Telephone Note  I connected with Paula Mills on 03/28/19 at  1:00 PM EDT by telephone and verified that I am speaking with the correct person using two identifiers.  Medicare Annual Wellness visit completed telephonically due to Covid-19 pandemic.   Location: Patient: home Provider: office   I discussed the limitations, risks, security and privacy concerns of performing an evaluation and management service by telephone and the availability of in person appointments. The patient expressed understanding and agreed to proceed.  Some vital signs may be absent or patient reported. Pt did not have at home blood pressure monitor, scale or thermometer available.   Clemetine Marker, LPN  Review of Systems:   Cardiac Risk Factors include: advanced age (>40men, >67 women);dyslipidemia     Objective:     Vitals: There were no vitals taken for this visit.  There is no height or weight on file to calculate BMI.  Advanced Directives 03/28/2019 03/23/2018 03/20/2017 02/09/2017 10/18/2016 10/14/2016 10/05/2016  Does Patient Have a Medical Advance Directive? Yes Yes Yes Yes Yes Yes Yes  Type of Paramedic of [place];Living will [place];Living will Living will [place];Living will [place];Living will [place];Living will [place];Living will  Does patient want to make changes to medical advance directive? - - - - - - -  Copy of [place] in Chart? No - copy requested No - copy requested - No - copy requested - - -  Would patient like information on creating a medical advance directive? - - - - - - -    Tobacco Social History   Tobacco Use  Smoking Status Never Smoker  Smokeless Tobacco Never Used  Tobacco Comment   smoking cessation materials not required     Counseling given: Not Answered Comment: smoking cessation materials not required   Clinical Intake:  Pre-visit preparation completed: Yes  Pain : No/denies pain     Nutritional Risks: None Diabetes: No  How often do you need to have someone help you when you read instructions, pamphlets, or other written materials from your doctor or pharmacy?: 1 - Never  Interpreter Needed?: No  Information entered by :: Clemetine Marker LPN  Past Medical History:  Diagnosis Date  . Hyperlipidemia   . Vertigo    Past Surgical History:  Procedure Laterality Date  . ABDOMINAL HYSTERECTOMY    . APPENDECTOMY N/A 03/14/2015   Procedure: APPENDECTOMY;  Surgeon: Molly Maduro, MD;  Location: [facility] ORS;  Service: General;  Laterality: N/A;  . CESAREAN SECTION    . COLONOSCOPY WITH PROPOFOL N/A 01/31/2017   Procedure: COLONOSCOPY WITH PROPOFOL;  Surgeon: Lucilla Lame, MD;  Location: [facility] ENDOSCOPY;  Service: Endoscopy;  Laterality: N/A;  . LAPAROTOMY N/A 03/14/2015   Procedure: EXPLORATORY LAPAROTOMY;  Surgeon: Molly Maduro, MD;  Location: [facility] ORS;  Service: General;  Laterality: N/A;   Family History  Problem Relation Age of Onset  . Hyperlipidemia Mother   . Hypertension Mother   . Multiple sclerosis Father   . Hypertension Father   . Hypertension Sister   . Hyperlipidemia Sister   . Multiple sclerosis Sister    Social History   Socioeconomic History  . Marital status: Married    Spouse name: Paula Mills  . Number of children: 2  . Years of  education: Not on file  . Highest education level: Master's degree (e.g., MA, MS, MEng, MEd, MSW, MBA)  Occupational History  . Occupation: Retired  Scientific laboratory technician  . Financial resource strain: Not hard at all  . Food insecurity    Worry: Never true    Inability: Never true  . Transportation needs    Medical: No    Non-medical: No  Tobacco Use  . Smoking status: Never Smoker  . Smokeless tobacco:  Never Used  . Tobacco comment: smoking cessation materials not required  Substance and Sexual Activity  . Alcohol use: Not Currently    Alcohol/week: 0.0 standard drinks  . Drug use: No  . Sexual activity: Not Currently    Partners: Male  Lifestyle  . Physical activity    Days per week: 7 days    Minutes per session: 40 min  . Stress: Not at all  Relationships  . Social connections    Talks on phone: More than three times a week    Gets together: Three times a week    Attends religious service: More than 4 times per year    Active member of club or organization: Yes    Attends meetings of clubs or organizations: More than 4 times per year    Relationship status: Married  Other Topics Concern  . Not on file  Social History Narrative  . Not on file    Outpatient Encounter Medications as of 03/28/2019  Medication Sig  . aspirin EC 81 MG tablet Take 1 tablet (81 mg total) by mouth daily. (Patient taking differently: Take 81 mg by mouth daily. Takes every once in awhile)  . Cholecalciferol (VITAMIN D) 2000 units CAPS Take 2,000 capsules by mouth daily.   . COD LIVER OIL PO Take by mouth.  . Multiple Vitamins-Minerals (WOMENS MULTIVITAMIN PO) Take 1 capsule by mouth daily.   . vitamin B-12 (CYANOCOBALAMIN) 500 MCG tablet Take 1,000 mcg by mouth daily.  . [DISCONTINUED] Calcium Carbonate-Vit D-Min (CALCIUM 600+D PLUS MINERALS) 600-400 MG-UNIT TABS Take by mouth.  . [DISCONTINUED] dextromethorphan-guaiFENesin (MUCINEX DM) 30-600 MG 12hr tablet Take 1 tablet by mouth 2 (two) times daily.  . [DISCONTINUED] doxycycline (VIBRA-TABS) 100 MG tablet Take 1 tablet (100 mg total) by mouth 2 (two) times daily.   No facility-administered encounter medications on file as of 03/28/2019.     Activities of Daily Living In your present state of health, do you have any difficulty performing the following activities: 03/28/2019 12/03/2018  Hearing? N N  Comment declines hearing aids -  Vision? N N   Comment wears glasses -  Difficulty concentrating or making decisions? N N  Walking or climbing stairs? N N  Dressing or bathing? N N  Doing errands, shopping? N N  Preparing Food and eating ? N -  Using the Toilet? N -  In the past six months, have you accidently leaked urine? N -  Do you have problems with loss of bowel control? N -  Managing your Medications? N -  Managing your Finances? N -  Housekeeping or managing your Housekeeping? N -  Some recent data might be hidden    Patient Care Team: Steele Sizer, MD as PCP - General (Family Medicine)    Assessment:   This is a routine wellness examination for [facility].  Exercise Activities and Dietary recommendations Current Exercise Habits: Home exercise routine, Type of exercise: walking, Time (Minutes): 40, Frequency (Times/Week): 7, Weekly Exercise (Minutes/Week): 280, Intensity: Mild, Exercise limited by:  None identified  Goals    . DIET - INCREASE WATER INTAKE     Recommend to drink at least 6-8 8oz glasses of water per day.       Fall Risk Fall Risk  03/28/2019 12/03/2018 03/23/2018 10/18/2017 03/20/2017  Falls in the past year? 1 0 No No Yes  Number falls in past yr: 1 0 - - 1  Comment - - - - vertigo  Injury with Fall? 0 0 - - No  Risk for fall due to : - - Impaired vision;Other (Comment) - -  Risk for fall due to: Comment - - vertigo, wears eyeglasses - -  Follow up Falls prevention discussed - - - Falls prevention discussed   FALL RISK PREVENTION PERTAINING TO THE HOME:  Any stairs in or around the home? Yes  If so, do they handrails? Yes   Home free of loose throw rugs in walkways, pet beds, electrical cords, etc? Yes  Adequate lighting in your home to reduce risk of falls? Yes   ASSISTIVE DEVICES UTILIZED TO PREVENT FALLS:  Life alert? No  Use of a cane, walker or w/c? No  Grab bars in the bathroom? No  Shower chair or bench in shower? Yes  Elevated toilet seat or a handicapped toilet? Yes   DME ORDERS:   DME order needed?  No   TIMED UP AND GO:  Was the test performed? No . Telephonic visit.  Education: Fall risk prevention has been discussed.  Intervention(s) required? No   Depression Screen PHQ 2/9 Scores 03/28/2019 12/03/2018 03/23/2018 10/18/2017  PHQ - 2 Score 0 0 0 0  PHQ- 9 Score 0 - 0 -     Cognitive Function     6CIT Screen 03/28/2019 03/23/2018 03/20/2017  What Year? 0 points 0 points 0 points  What month? 0 points 0 points 0 points  What time? 0 points 0 points 0 points  Count back from 20 0 points 0 points 0 points  Months in reverse 0 points 0 points 0 points  Repeat phrase 0 points 2 points 6 points  Total Score 0 2 6    Immunization History  Administered Date(s) Administered  . Influenza, Seasonal, Injecte, Preservative Fre 10/01/2013    Qualifies for Shingles Vaccine? Yes  . Due for Shingrix. Education has been provided regarding the importance of this vaccine. Pt has been advised to call insurance company to determine out of pocket expense. Advised may also receive vaccine at local pharmacy or Health Dept. Verbalized acceptance and understanding.  Tdap: Although this vaccine is not a covered service during a Wellness Exam, does the patient still wish to receive this vaccine today?  No .  Education has been provided regarding the importance of this vaccine. Advised may receive this vaccine at local pharmacy or Health Dept. Aware to provide a copy of the vaccination record if obtained from local pharmacy or Health Dept. Verbalized acceptance and understanding.  Flu Vaccine: Due for Flu vaccine. Does the patient want to receive this vaccine today?  No . Education has been provided regarding the importance of this vaccine but still declined. Advised may receive this vaccine at local pharmacy or Health Dept. Aware to provide a copy of the vaccination record if obtained from local pharmacy or Health Dept. Verbalized acceptance and understanding.  Pneumococcal Vaccine: Due  for Pneumococcal vaccine. Does the patient want to receive this vaccine today?  No . Education has been provided regarding the importance of this vaccine but still  declined. Advised may receive this vaccine at local pharmacy or Health Dept. Aware to provide a copy of the vaccination record if obtained from local pharmacy or Health Dept. Verbalized acceptance and understanding.   Screening Tests Health Maintenance  Topic Date Due  . MAMMOGRAM  06/01/2018  . PNA vac Low Risk Adult (1 of 2 - PCV13) 09/26/2019 (Originally 08/20/2010)  . TETANUS/TDAP  10/03/2026 (Originally 10/03/2016)  . INFLUENZA VACCINE  05/04/2019  . COLONOSCOPY  01/31/2022  . DEXA SCAN  Completed  . Hepatitis C Screening  Completed    Cancer Screenings:  Colorectal Screening: Completed 01/31/17. Repeat every 5 years  Mammogram: Completed 06/01/17.Ordered today. Pt provided with contact information and advised to call to schedule appt.   Bone Density: Completed 06/01/17. Results reflect OSTEOPOROSIS. Repeat every 2 years. Ordered today. Pt provided with contact information and advised to call to schedule appt.   Lung Cancer Screening: (Low Dose CT Chest recommended if Age 50-80 years, 30 pack-year currently smoking OR have quit w/in 15years.) does not qualify.    Additional Screening:  Hepatitis C Screening: does qualify; Completed 02/19/13  Vision Screening: Recommended annual ophthalmology exams for early detection of glaucoma and other disorders of the eye. Is the patient up to date with their annual eye exam?  Yes  Who is the provider or what is the name of the office in which the pt attends annual eye exams? [facility]   Dental Screening: Recommended annual dental exams for proper oral hygiene  Community Resource Referral:  CRR required this visit?  No      Plan:     I have personally reviewed and addressed the Medicare Annual Wellness questionnaire and have noted the following in the patient's chart:  A.  Medical and social history B. Use of alcohol, tobacco or illicit drugs  C. Current medications and supplements D. Functional ability and status E.  Nutritional status F.  Physical activity G. Advance directives H. List of other physicians I.  Hospitalizations, surgeries, and ER visits in previous 12 months J.  [place] such as hearing and vision if needed, cognitive and depression L. Referrals and appointments   In addition, I have reviewed and discussed with patient certain preventive protocols, quality metrics, and best practice recommendations. A written personalized care plan for preventive services as well as general preventive health recommendations were provided to patient.   Signed,  Clemetine Marker, LPN Nurse Health Advisor   Nurse Notes: pt doing well and appreciative of visit today. Scheduled for follow up with Dr. Ancil Boozer tomorrow.

## 2019-03-29 ENCOUNTER — Ambulatory Visit (INDEPENDENT_AMBULATORY_CARE_PROVIDER_SITE_OTHER): Payer: Medicare Other | Admitting: Family Medicine

## 2019-03-29 ENCOUNTER — Encounter: Payer: Self-pay | Admitting: Family Medicine

## 2019-03-29 ENCOUNTER — Other Ambulatory Visit: Payer: Self-pay

## 2019-03-29 VITALS — BP 140/84 | HR 81 | Temp 98.1°F | Resp 16 | Ht 62.0 in | Wt 161.2 lb

## 2019-03-29 DIAGNOSIS — I6523 Occlusion and stenosis of bilateral carotid arteries: Secondary | ICD-10-CM | POA: Diagnosis not present

## 2019-03-29 DIAGNOSIS — E785 Hyperlipidemia, unspecified: Secondary | ICD-10-CM

## 2019-03-29 DIAGNOSIS — E559 Vitamin D deficiency, unspecified: Secondary | ICD-10-CM

## 2019-03-29 DIAGNOSIS — M47816 Spondylosis without myelopathy or radiculopathy, lumbar region: Secondary | ICD-10-CM | POA: Diagnosis not present

## 2019-03-29 DIAGNOSIS — I700 Atherosclerosis of aorta: Secondary | ICD-10-CM

## 2019-03-29 DIAGNOSIS — Z789 Other specified health status: Secondary | ICD-10-CM | POA: Diagnosis not present

## 2019-03-29 DIAGNOSIS — M818 Other osteoporosis without current pathological fracture: Secondary | ICD-10-CM

## 2019-03-29 NOTE — Progress Notes (Signed)
Name: Paula Mills   MRN: [id number]    DOB: May 31, 1946   Date:03/29/2019       Progress Note  Subjective  Chief Complaint  Chief Complaint  Patient presents with  . Medication Refill  . Hyperlipidemia  . Osteoporosis  . Vegetarian  . Metabolic syndrome    HPI  Hyperlipidemia:LDL is very high, but she does not want to take statins, and does not want to try any other class of medication, she has carotid atherosclerosis , she refuses medication even with the data below   The 10-year ASCVD risk score Mikey Bussing DC Brooke Bonito., et al., 2013) is: 26.3%   Values used to calculate the score:     Age: 73 years     Sex: Female     Is Non-Hispanic African American: Yes     Diabetic: No     Tobacco smoker: No     Systolic Blood Pressure: 725 mmHg     Is BP treated: No     HDL Cholesterol: 80 mg/dL     Total Cholesterol: 309 mg/dL  Low mid back pain: she has been walking more since gym is closed , she has noticed intermittent right low aching back pain, no radiculitis. Discussed stretching, resuming yoga at home and foam roller   Metabolic syndrome: follows a vegetarian diet, she denies polyphagia, polydipsia or polyuria. We will check glucose  Osteoporosis: discussed importance of considering medication, she continues to refuse starting medication, discussed all options, including Forteo, Prolia and bisphosphonate. She still does not want to take medications   Vegetarian : she has a healthy diet, we will recheck B12 level   OA right knee: seen by Ortho had one injection and pain is doing better, no effusion at this time   Patient Active Problem List   Diagnosis Date Noted  . Screen for colon cancer   . Benign neoplasm of ascending colon   . Polyp of sigmoid colon   . Carotid artery calcification, bilateral 10/18/2016  . At risk for falling 04/08/2016  . H/O: HTN (hypertension) 04/08/2016  . Hypo-ovarianism 04/08/2016  . Asymptomatic varicose veins 04/08/2016  . Avitaminosis D  04/08/2016  . Hiatal hernia 02/09/2016  . Liver cyst 02/09/2016  . Lumbar spondylosis 02/09/2016  . Osteoporosis 02/09/2016  . Dyslipidemia 02/09/2016  . Vegetarian 02/09/2016  . Vitamin D deficiency 02/09/2016  . Calcification of abdominal aorta (HCC) 02/09/2016  . History of small bowel obstruction 03/12/2015    Past Surgical History:  Procedure Laterality Date  . ABDOMINAL HYSTERECTOMY    . APPENDECTOMY N/A 03/14/2015   Procedure: APPENDECTOMY;  Surgeon: Molly Maduro, MD;  Location: [facility];  Service: General;  Laterality: N/A;  . CESAREAN SECTION    . COLONOSCOPY WITH PROPOFOL N/A 01/31/2017   Procedure: COLONOSCOPY WITH PROPOFOL;  Surgeon: Lucilla Lame, MD;  Location: [facility];  Service: Endoscopy;  Laterality: N/A;  . LAPAROTOMY N/A 03/14/2015   Procedure: EXPLORATORY LAPAROTOMY;  Surgeon: Molly Maduro, MD;  Location: [facility];  Service: General;  Laterality: N/A;    Family History  Problem Relation Age of Onset  . Hyperlipidemia Mother   . Hypertension Mother   . Multiple sclerosis Father   . Hypertension Father   . Hypertension Sister   . Hyperlipidemia Sister   . Multiple sclerosis Sister     Social History   Socioeconomic History  . Marital status: Married    Spouse name: Winferd Humphrey  . Number of children: 2  . Years of education: Not on  file  . Highest education level: Master's degree (e.g., MA, MS, MEng, MEd, MSW, MBA)  Occupational History  . Occupation: Retired  Scientific laboratory technician  . Financial resource strain: Not hard at all  . Food insecurity    Worry: Never true    Inability: Never true  . Transportation needs    Medical: No    Non-medical: No  Tobacco Use  . Smoking status: Never Smoker  . Smokeless tobacco: Never Used  . Tobacco comment: smoking cessation materials not required  Substance and Sexual Activity  . Alcohol use: Not Currently    Alcohol/week: 0.0 standard drinks  . Drug use: No  . Sexual activity: Not Currently     Partners: Male  Lifestyle  . Physical activity    Days per week: 7 days    Minutes per session: 40 min  . Stress: Not at all  Relationships  . Social connections    Talks on phone: More than three times a week    Gets together: Three times a week    Attends religious service: More than 4 times per year    Active member of club or organization: Yes    Attends meetings of clubs or organizations: More than 4 times per year    Relationship status: Married  . Intimate partner violence    Fear of current or ex partner: No    Emotionally abused: No    Physically abused: No    Forced sexual activity: No  Other Topics Concern  . Not on file  Social History Narrative  . Not on file     Current Outpatient Medications:  .  aspirin EC 81 MG tablet, Take 1 tablet (81 mg total) by mouth daily. (Patient taking differently: Take 81 mg by mouth daily. Takes every once in awhile), Disp: 30 tablet, Rfl: 0 .  Cholecalciferol (VITAMIN D) 2000 units CAPS, Take 2,000 capsules by mouth daily. , Disp: , Rfl:  .  COD LIVER OIL PO, Take by mouth., Disp: , Rfl:  .  Multiple Vitamins-Minerals (WOMENS MULTIVITAMIN PO), Take 1 capsule by mouth daily. , Disp: , Rfl:  .  vitamin B-12 (CYANOCOBALAMIN) 500 MCG tablet, Take 1,000 mcg by mouth daily., Disp: , Rfl:   No Known Allergies  I personally reviewed active problem list, medication list, allergies, family history, social history with the patient/caregiver today.   ROS  Constitutional: Negative for fever or weight change.  Respiratory: Negative for cough and shortness of breath.   Cardiovascular: Negative for chest pain or palpitations.  Gastrointestinal: Negative for abdominal pain, no bowel changes.  Musculoskeletal: Negative for gait problem or joint swelling.  Skin: Negative for rash.  Neurological: Negative for dizziness or headache.  No other specific complaints in a complete review of systems (except as listed in HPI  above).  Objective  Vitals:   03/29/19 1347  BP: 140/84  Pulse: 81  Resp: 16  Temp: 98.1 F (36.7 C)  TempSrc: Oral  SpO2: 97%  Weight: 161 lb 3.2 oz (73.1 kg)  Height: 5\' 2"  (1.575 m)    Body mass index is 29.48 kg/m.  Physical Exam  Constitutional: Patient appears well-developed and well-nourished. Obese No distress.  HEENT: head atraumatic, normocephalic, pupils equal and reactive to light, neck supple, throat within normal limits Cardiovascular: Normal rate, regular rhythm and normal heart sounds.  No murmur heard. No BLE edema. Pulmonary/Chest: Effort normal and breath sounds normal. No respiratory distress. Abdominal: Soft.  There is no tenderness. Muscular skeletal:  no pain during palpation of lumbar spine, negative straight leg raise  Psychiatric: Patient has a normal mood and affect. behavior is normal. Judgment and thought content normal.   PHQ2/9: Depression screen [facility] 2/9 03/28/2019 12/03/2018 03/23/2018 10/18/2017 03/20/2017  Decreased Interest 0 0 0 0 0  Down, Depressed, Hopeless 0 0 0 0 0  PHQ - 2 Score 0 0 0 0 0  Altered sleeping 0 - 0 - 0  Tired, decreased energy 0 - 0 - 0  Change in appetite 0 - 0 - 0  Feeling bad or failure about yourself  0 - 0 - 0  Trouble concentrating 0 - 0 - 0  Moving slowly or fidgety/restless 0 - 0 - 0  Suicidal thoughts 0 - 0 - 0  PHQ-9 Score 0 - 0 - 0  Difficult doing work/chores Not difficult at all - Not difficult at all - -    phq 9 is negative   Fall Risk: Fall Risk  03/29/2019 03/28/2019 12/03/2018 03/23/2018 10/18/2017  Falls in the past year? 1 1 0 No No  Number falls in past yr: 1 1 0 - -  Comment - - - - -  Injury with Fall? 0 0 0 - -  Risk for fall due to : - - - Impaired vision;Other (Comment) -  Risk for fall due to: Comment - - - vertigo, wears eyeglasses -  Follow up - Falls prevention discussed - - -    Functional Status Survey: Is the patient deaf or have difficulty hearing?: No Does the patient have  difficulty seeing, even when wearing glasses/contacts?: Yes Does the patient have difficulty concentrating, remembering, or making decisions?: No Does the patient have difficulty walking or climbing stairs?: No Does the patient have difficulty dressing or bathing?: No Does the patient have difficulty doing errands alone such as visiting a doctor's office or shopping?: No    Assessment & Plan  1. Lumbar spondylosis  Discussed stretching exercises  2. Calcification of abdominal aorta (HCC)  Takes aspirin occasionally, not on statin   3. Dyslipidemia  - Lipid panel - COMPLETE METABOLIC PANEL WITH GFR  4. Vegetarian  - Methylmalonic Acid  5. Vitamin D deficiency  - VITAMIN D 25 Hydroxy (Vit-D Deficiency, Fractures)  6. Carotid artery calcification, bilateral   7. Other osteoporosis without current pathological fracture  - VITAMIN D 25 Hydroxy (Vit-D Deficiency, Fractures) - COMPLETE METABOLIC PANEL WITH GFR

## 2019-03-29 NOTE — Patient Instructions (Signed)

## 2019-04-02 LAB — LIPID PANEL
Cholesterol: 297 mg/dL — ABNORMAL HIGH (ref <200)
HDL: 74 mg/dL (ref >=50)
LDL Cholesterol (Calc): 205 mg/dL (calc) — ABNORMAL HIGH
Non-HDL Cholesterol (Calc): 223 mg/dL (calc) — ABNORMAL HIGH (ref <130)
Total CHOL/HDL Ratio: 4 (calc) (ref <5.0)
Triglycerides: 70 mg/dL (ref <150)

## 2019-04-02 LAB — COMPLETE METABOLIC PANEL WITH GFR
AG Ratio: 1.2 (calc) (ref 1.0–2.5)
ALT: 13 U/L (ref 6–29)
AST: 19 U/L (ref 10–35)
Albumin: 4 g/dL (ref 3.6–5.1)
Alkaline phosphatase (APISO): 78 U/L (ref 37–153)
BUN: 8 mg/dL (ref 7–25)
CO2: 26 mmol/L (ref 20–32)
Calcium: 9.1 mg/dL (ref 8.6–10.4)
Chloride: 104 mmol/L (ref 98–110)
Creat: 0.66 mg/dL (ref 0.60–0.93)
GFR, Est African American: 102 mL/min/{1.73_m2} (ref >=60)
GFR, Est Non African American: 88 mL/min/{1.73_m2} (ref >=60)
Globulin: 3.4 g/dL (calc) (ref 1.9–3.7)
Glucose, Bld: 77 mg/dL (ref 65–99)
Potassium: 3.6 mmol/L (ref 3.5–5.3)
Sodium: 139 mmol/L (ref 135–146)
Total Bilirubin: 0.6 mg/dL (ref 0.2–1.2)
Total Protein: 7.4 g/dL (ref 6.1–8.1)

## 2019-04-02 LAB — METHYLMALONIC ACID, SERUM: Methylmalonic Acid, Quant: 119 nmol/L (ref 87–318)

## 2019-04-02 LAB — VITAMIN D 25 HYDROXY (VIT D DEFICIENCY, FRACTURES): Vit D, 25-Hydroxy: 54 ng/mL (ref 30–100)

## 2019-04-23 ENCOUNTER — Ambulatory Visit (INDEPENDENT_AMBULATORY_CARE_PROVIDER_SITE_OTHER): Payer: Medicare Other | Admitting: Family Medicine

## 2019-04-23 ENCOUNTER — Ambulatory Visit
Admission: RE | Admit: 2019-04-23 | Discharge: 2019-04-23 | Disposition: A | Discharge status: Home / Self Care | Payer: Medicare Other | Source: Ambulatory Visit | Attending: Family Medicine | Admitting: Family Medicine

## 2019-04-23 ENCOUNTER — Encounter: Payer: Self-pay | Admitting: Family Medicine

## 2019-04-23 ENCOUNTER — Other Ambulatory Visit: Payer: Self-pay

## 2019-04-23 ENCOUNTER — Other Ambulatory Visit: Payer: Self-pay | Admitting: Family Medicine

## 2019-04-23 VITALS — BP 134/86 | HR 77 | Temp 97.3°F | Resp 16 | Ht 62.0 in | Wt 160.6 lb

## 2019-04-23 DIAGNOSIS — M79662 Pain in left lower leg: Secondary | ICD-10-CM | POA: Diagnosis not present

## 2019-04-23 DIAGNOSIS — M170 Bilateral primary osteoarthritis of knee: Secondary | ICD-10-CM

## 2019-04-23 DIAGNOSIS — I6523 Occlusion and stenosis of bilateral carotid arteries: Secondary | ICD-10-CM

## 2019-04-23 MED ORDER — MELOXICAM 15 MG PO TABS: 15.0000 mg | ORAL_TABLET | Freq: Every day | ORAL | 0 refills | Status: DC

## 2019-04-23 NOTE — Addendum Note (Signed)
Addended by: Saunders Glance A on: 04/23/2019 10:05 AM   Modules accepted: Orders

## 2019-04-23 NOTE — Progress Notes (Signed)
Name: Paula Mills   MRN: [id number]    DOB: 1946-01-15   Date:04/23/2019       Progress Note  Subjective  Chief Complaint  Chief Complaint  Patient presents with  . Knee Pain    Onset-a week and half, left knee-behind her knee and to the side of it hurts when sitting and tries to get back up also with certain movements.     HPI  Left calf pain: symptoms started about two weeks ago, no trauma, pain is not constant. Seems to be present when she goes up steps and after she walk, she has not noticed any anterior knee problems such as effusion or erythema, pain is described as tightness or pinching, and now is radiating to lateral outer hip . No previous history of DVT or clotting issues. She has a history of baker's cyst on right side.    Patient Active Problem List   Diagnosis Date Noted  . Screen for colon cancer   . Benign neoplasm of ascending colon   . Polyp of sigmoid colon   . Carotid artery calcification, bilateral 10/18/2016  . At risk for falling 04/08/2016  . H/O: HTN (hypertension) 04/08/2016  . Hypo-ovarianism 04/08/2016  . Asymptomatic varicose veins 04/08/2016  . Avitaminosis D 04/08/2016  . Hiatal hernia 02/09/2016  . Liver cyst 02/09/2016  . Lumbar spondylosis 02/09/2016  . Osteoporosis 02/09/2016  . Dyslipidemia 02/09/2016  . Vegetarian 02/09/2016  . Vitamin D deficiency 02/09/2016  . Calcification of abdominal aorta (HCC) 02/09/2016  . History of small bowel obstruction 03/12/2015    Past Surgical History:  Procedure Laterality Date  . ABDOMINAL HYSTERECTOMY    . APPENDECTOMY N/A 03/14/2015   Procedure: APPENDECTOMY;  Surgeon: Molly Maduro, MD;  Location: [facility];  Service: General;  Laterality: N/A;  . CESAREAN SECTION    . COLONOSCOPY WITH PROPOFOL N/A 01/31/2017   Procedure: COLONOSCOPY WITH PROPOFOL;  Surgeon: Lucilla Lame, MD;  Location: [facility];  Service: Endoscopy;  Laterality: N/A;  . LAPAROTOMY N/A 03/14/2015   Procedure: EXPLORATORY  LAPAROTOMY;  Surgeon: Molly Maduro, MD;  Location: [facility];  Service: General;  Laterality: N/A;    Family History  Problem Relation Age of Onset  . Hyperlipidemia Mother   . Hypertension Mother   . Multiple sclerosis Father   . Hypertension Father   . Hypertension Sister   . Hyperlipidemia Sister   . Multiple sclerosis Sister     Social History   Socioeconomic History  . Marital status: Married    Spouse name: Winferd Humphrey  . Number of children: 2  . Years of education: Not on file  . Highest education level: Master's degree (e.g., MA, MS, MEng, MEd, MSW, MBA)  Occupational History  . Occupation: Retired  Scientific laboratory technician  . Financial resource strain: Not hard at all  . Food insecurity    Worry: Never true    Inability: Never true  . Transportation needs    Medical: No    Non-medical: No  Tobacco Use  . Smoking status: Never Smoker  . Smokeless tobacco: Never Used  . Tobacco comment: smoking cessation materials not required  Substance and Sexual Activity  . Alcohol use: Not Currently    Alcohol/week: 0.0 standard drinks  . Drug use: No  . Sexual activity: Not Currently    Partners: Male  Lifestyle  . Physical activity    Days per week: 7 days    Minutes per session: 40 min  . Stress: Not at all  Relationships  . Social connections    Talks on phone: More than three times a week    Gets together: Three times a week    Attends religious service: More than 4 times per year    Active member of club or organization: Yes    Attends meetings of clubs or organizations: More than 4 times per year    Relationship status: Married  . Intimate partner violence    Fear of current or ex partner: No    Emotionally abused: No    Physically abused: No    Forced sexual activity: No  Other Topics Concern  . Not on file  Social History Narrative  . Not on file     Current Outpatient Medications:  .  Cholecalciferol (VITAMIN D) 2000 units CAPS, Take 2,000 capsules by  mouth daily. , Disp: , Rfl:  .  COD LIVER OIL PO, Take by mouth., Disp: , Rfl:  .  Multiple Vitamins-Minerals (WOMENS MULTIVITAMIN PO), Take 1 capsule by mouth daily. , Disp: , Rfl:  .  vitamin B-12 (CYANOCOBALAMIN) 500 MCG tablet, Take 1,000 mcg by mouth daily., Disp: , Rfl:  .  aspirin EC 81 MG tablet, Take 1 tablet (81 mg total) by mouth daily. (Patient not taking: Reported on 04/23/2019), Disp: 30 tablet, Rfl: 0  No Known Allergies  I personally reviewed active problem list, medication list, allergies, family history, social history with the patient/caregiver today.   ROS  Ten systems reviewed and is negative except as mentioned in HPI   Objective  Vitals:   04/23/19 0929  BP: 134/86  Pulse: 77  Resp: 16  Temp: (!) 97.3 F (36.3 C)  TempSrc: Temporal  SpO2: 98%  Weight: 160 lb 9.6 oz (72.8 kg)  Height: 5\' 2"  (1.575 m)    Body mass index is 29.37 kg/m.  Physical Exam  Constitutional: Patient appears well-developed and well-nourished. Overweight.  No distress.  HEENT: head atraumatic, normocephalic, pupils equal and reactive to light,  neck supple, throat within normal limits Cardiovascular: Normal rate, regular rhythm and normal heart sounds.  No murmur heard. No BLE edema. Pulmonary/Chest: Effort normal and breath sounds normal. No respiratory distress. Muscular Skeletal; normal size calf, varicose on the right side, no erythema, pain during compression of right calf, crepitus with extension of both knees.  Abdominal: Soft.  There is no tenderness. Psychiatric: Patient has a normal mood and affect. behavior is normal. Judgment and thought content normal.  Recent Results (from the past 2160 hour(s))  Lipid panel     Status: Abnormal   Collection Time: 03/29/19  2:34 PM  Result Value Ref Range   Cholesterol 297 (H) <200 mg/dL   HDL 74 > OR = 50 mg/dL   Triglycerides 70 <150 mg/dL   LDL Cholesterol (Calc) 205 (H) mg/dL (calc)    Comment: LDL-C levels > or = 190  mg/dL may indicate familial  hypercholesterolemia (FH). Clinical assessment and  measurement of blood lipid levels should be  considered for all first degree relatives of  patients with an FH diagnosis.  For questions about testing for familial hypercholesterolemia, please call Insurance risk surveyor at [email or web address]. Duncan Dull, et al. J National Lipid Association  Recommendations for Patient-Centered Management of  Dyslipidemia: Part 1 Journal of Clinical Lipidology  2015;9(2), 129-169. Reference range: <100 . Desirable range <100 mg/dL for primary prevention;   <70 mg/dL for patients with CHD or diabetic patients  with > or = 2 CHD risk factors. Marland Kitchen LDL-C  is now calculated using the Martin-Hopkins  calculation, which is a validated novel method providing  better accuracy than the Friedewald equation in the  estimation of LDL-C.  Cresenciano Genre et al. Annamaria Helling. 9242;683(41): 2061-2068  (http://education.[facility].com/faq/FAQ164)    Total CHOL/HDL Ratio 4.0 <5.0 (calc)   Non-HDL Cholesterol (Calc) 223 (H) <130 mg/dL (calc)    Comment: Non-HDL level > or = 220 is very high and may indicate  genetic familial hypercholesterolemia (FH). Clinical  assessment and measurement of blood lipid levels  should be considered for all first-degree relatives  of patients with an FH diagnosis. . For patients with diabetes plus 1 major ASCVD risk  factor, treating to a non-HDL-C goal of <100 mg/dL  (LDL-C of <70 mg/dL) is considered a therapeutic  option.   VITAMIN D 25 Hydroxy (Vit-D Deficiency, Fractures)     Status: None   Collection Time: 03/29/19  2:34 PM  Result Value Ref Range   Vit D, 25-Hydroxy 54 30 - 100 ng/mL    Comment: Vitamin D Status         25-OH Vitamin D: . Deficiency:                    <20 ng/mL Insufficiency:             20 - 29 ng/mL Optimal:                 > or = 30 ng/mL . For 25-OH Vitamin D testing on patients on  D2-supplementation and patients for  whom quantitation  of D2 and D3 fractions is required, the [facility](TM) 25-OH VIT D, (D2,D3), LC/MS/MS is recommended: order  code [id number] (patients >68yrs). See Note 1 . Note 1 . For additional information, please refer to  http://education.[facility].com/faq/FAQ199  (This link is being provided for informational/ educational purposes only.)   COMPLETE METABOLIC PANEL WITH GFR     Status: None   Collection Time: 03/29/19  2:34 PM  Result Value Ref Range   Glucose, Bld 77 65 - 99 mg/dL    Comment: .            Fasting reference interval .    BUN 8 7 - 25 mg/dL   Creat 0.66 0.60 - 0.93 mg/dL    Comment: For patients >45 years of age, the reference limit for Creatinine is approximately 13% higher for people identified as African-American. .    GFR, Est Non African American 88 > OR = 60 mL/min/1.89m2   GFR, Est African American 102 > OR = 60 mL/min/1.52m2   BUN/Creatinine Ratio NOT APPLICABLE 6 - 22 (calc)   Sodium 139 135 - 146 mmol/L   Potassium 3.6 3.5 - 5.3 mmol/L   Chloride 104 98 - 110 mmol/L   CO2 26 20 - 32 mmol/L   Calcium 9.1 8.6 - 10.4 mg/dL   Total Protein 7.4 6.1 - 8.1 g/dL   Albumin 4.0 3.6 - 5.1 g/dL   Globulin 3.4 1.9 - 3.7 g/dL (calc)   AG Ratio 1.2 1.0 - 2.5 (calc)   Total Bilirubin 0.6 0.2 - 1.2 mg/dL   Alkaline phosphatase (APISO) 78 37 - 153 U/L   AST 19 10 - 35 U/L   ALT 13 6 - 29 U/L  Methylmalonic Acid     Status: None   Collection Time: 03/29/19  2:34 PM  Result Value Ref Range   Methylmalonic Acid, Quant 119 87 - 318 nmol/L    Comment: . This test was  developed and its analytical performance characteristics have been determined by [place], [place]. It has not been cleared or approved by the U.S. Food and Drug Administration. This assay has been validated pursuant to the CLIA regulations and is used for clinical purposes. .      PHQ2/9: Depression screen [facility] 2/9 03/28/2019 12/03/2018 03/23/2018  10/18/2017 03/20/2017  Decreased Interest 0 0 0 0 0  Down, Depressed, Hopeless 0 0 0 0 0  PHQ - 2 Score 0 0 0 0 0  Altered sleeping 0 - 0 - 0  Tired, decreased energy 0 - 0 - 0  Change in appetite 0 - 0 - 0  Feeling bad or failure about yourself  0 - 0 - 0  Trouble concentrating 0 - 0 - 0  Moving slowly or fidgety/restless 0 - 0 - 0  Suicidal thoughts 0 - 0 - 0  PHQ-9 Score 0 - 0 - 0  Difficult doing work/chores Not difficult at all - Not difficult at all - -    phq 9 is negative   Fall Risk: Fall Risk  03/29/2019 03/28/2019 12/03/2018 03/23/2018 10/18/2017  Falls in the past year? 1 1 0 No No  Number falls in past yr: 1 1 0 - -  Comment - - - - -  Injury with Fall? 0 0 0 - -  Risk for fall due to : - - - Impaired vision;Other (Comment) -  Risk for fall due to: Comment - - - vertigo, wears eyeglasses -  Follow up - Falls prevention discussed - - -     Assessment & Plan  1. Pain of left calf  - VAS [place] LOWER EXTREMITY VENOUS (DVT); Future  2. Primary osteoarthritis of both knees  If doppler negative we will call in meloxicam 15 mg to take with food daily for 7 days and after that prn

## 2019-08-09 ENCOUNTER — Other Ambulatory Visit: Payer: Self-pay | Admitting: Family Medicine

## 2019-08-09 DIAGNOSIS — M170 Bilateral primary osteoarthritis of knee: Secondary | ICD-10-CM

## 2019-08-09 NOTE — Telephone Encounter (Signed)
Medication Refill - Medication: meloxicam (MOBIC) 15 MG tablet  Has the patient contacted their pharmacy? yes (Agent: If no, request that the patient contact the pharmacy for the refill.) (Agent: If yes, when and what did the pharmacy advise?)  Preferred Pharmacy (with phone number or street name):  [id number] [address], [place] [phone] (Phone) [phone] (Fax)   Agent: Please be advised that RX refills may take up to 3 business days. We ask that you follow-up with your pharmacy.

## 2019-08-10 MED ORDER — MELOXICAM 15 MG PO TABS: 15.0000 mg | ORAL_TABLET | Freq: Every day | ORAL | 0 refills | Status: DC

## 2019-09-20 ENCOUNTER — Other Ambulatory Visit: Payer: Self-pay | Admitting: Family Medicine

## 2019-09-20 DIAGNOSIS — M170 Bilateral primary osteoarthritis of knee: Secondary | ICD-10-CM

## 2019-10-29 ENCOUNTER — Ambulatory Visit
Admission: RE | Admit: 2019-10-29 | Discharge: 2019-10-29 | Disposition: A | Discharge status: Home / Self Care | Payer: Medicare HMO | Source: Ambulatory Visit | Attending: Family Medicine | Admitting: Family Medicine

## 2019-10-29 DIAGNOSIS — M810 Age-related osteoporosis without current pathological fracture: Secondary | ICD-10-CM | POA: Diagnosis present

## 2019-10-29 DIAGNOSIS — Z780 Asymptomatic menopausal state: Secondary | ICD-10-CM | POA: Diagnosis present

## 2019-10-29 DIAGNOSIS — Z1231 Encounter for screening mammogram for malignant neoplasm of breast: Secondary | ICD-10-CM | POA: Diagnosis present

## 2019-10-31 ENCOUNTER — Telehealth: Payer: Self-pay

## 2019-10-31 NOTE — Telephone Encounter (Signed)
Dr. Ancil Boozer reviewed last DEXA scan and most recent one. She states progression is mild, however, due to having Osteoporosis she does recommend medication. She wants to inform the patient to continue taking Vitamin D and she would have the patient come in to talk about medication. The patient can research Fosamax is what Dr. Ancil Boozer usually prescribes most patient and see if she would be willing to start it. If not she needs to come in to discuss all alternatives. I will mail her results with Dr. Ancil Boozer comparisons on last scan to the most recent with the changes highlighted.

## 2020-02-03 IMAGING — MG DIGITAL SCREENING BILAT W/ TOMO W/ CAD
8 series · 8 of 24 positions shown · non-contrast
Comparison: Previous exam(s).

ACR Breast Density Category a: The breast tissue is almost entirely
fatty.

CLINICAL DATA: Screening.

EXAM:
DIGITAL SCREENING BILATERAL MAMMOGRAM WITH TOMO AND CAD

[L MLO synth-2D]
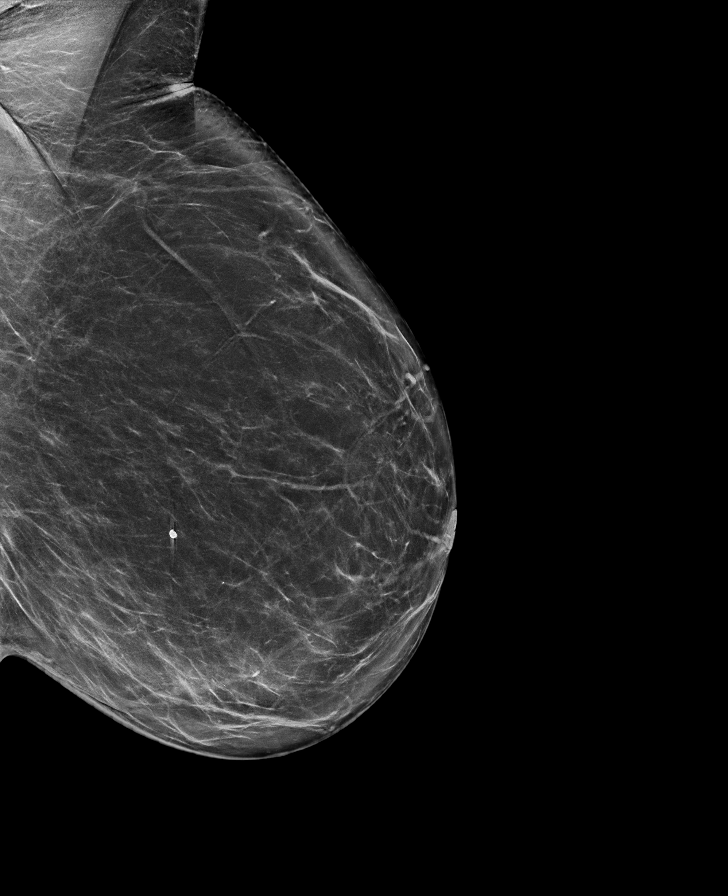

[R CC synth-2D]
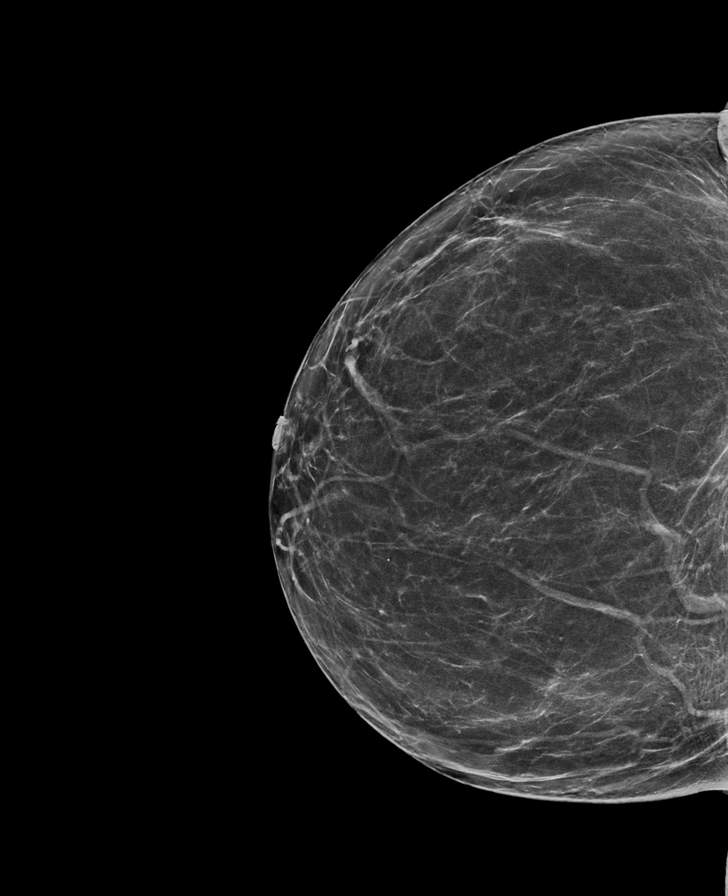

[L CC synth-2D]
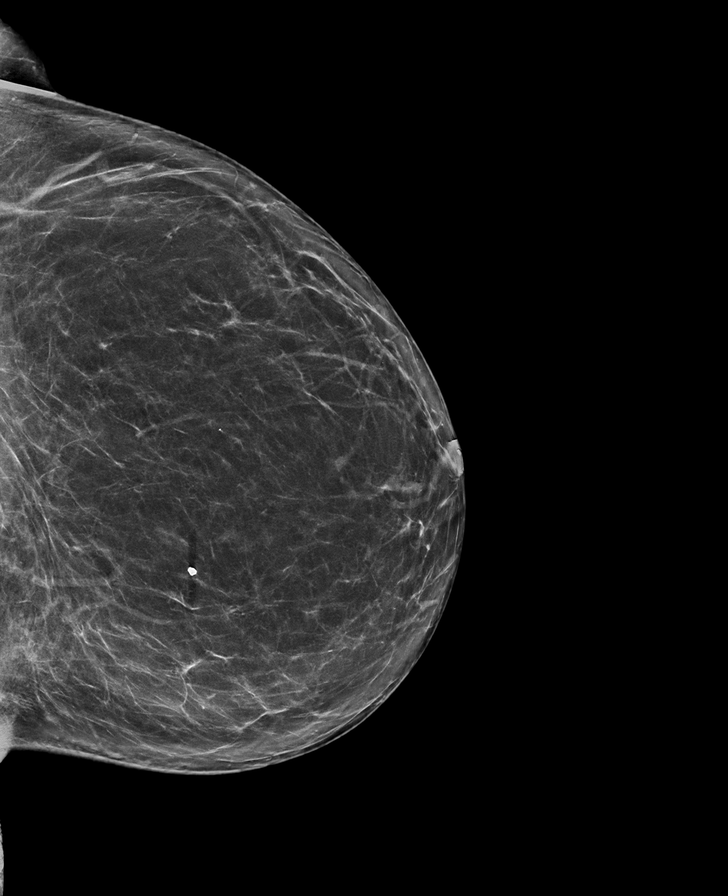

[R MLO synth-2D]
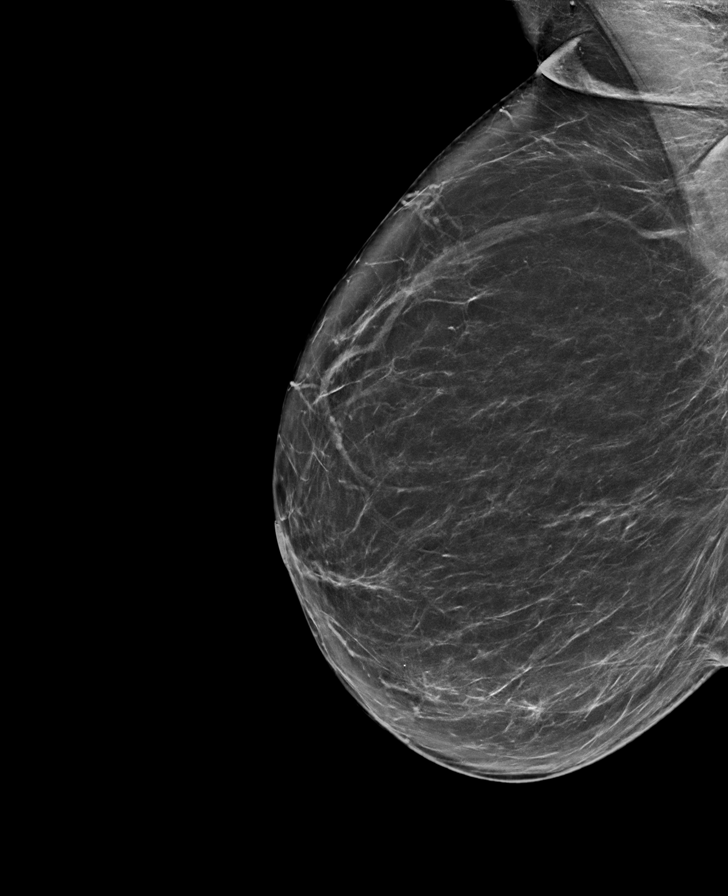

[L CC tomo · tomo slice 41/80.0]
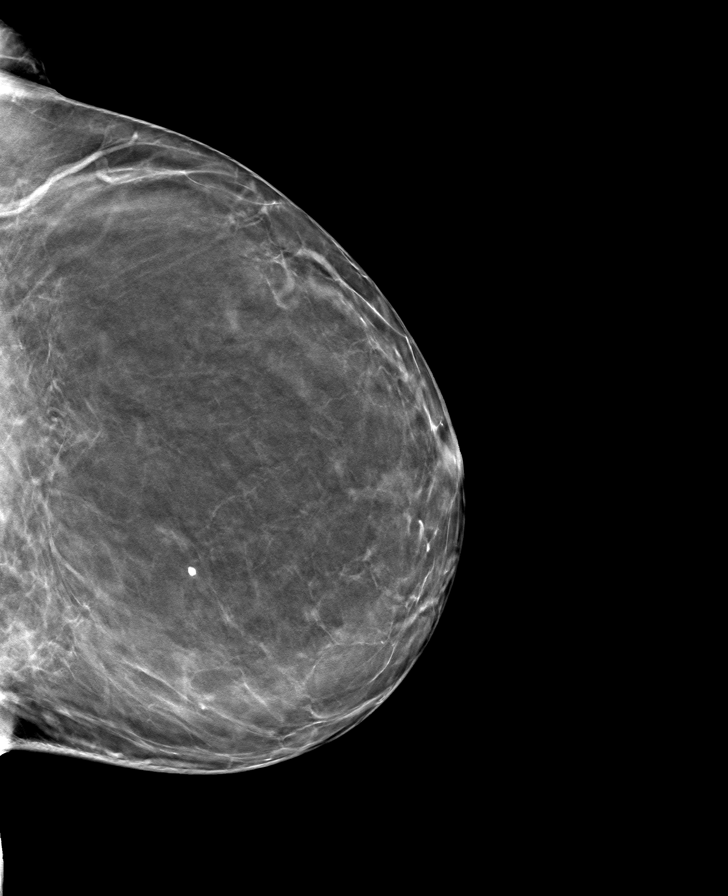

[R MLO tomo · tomo slice 47/93.0]
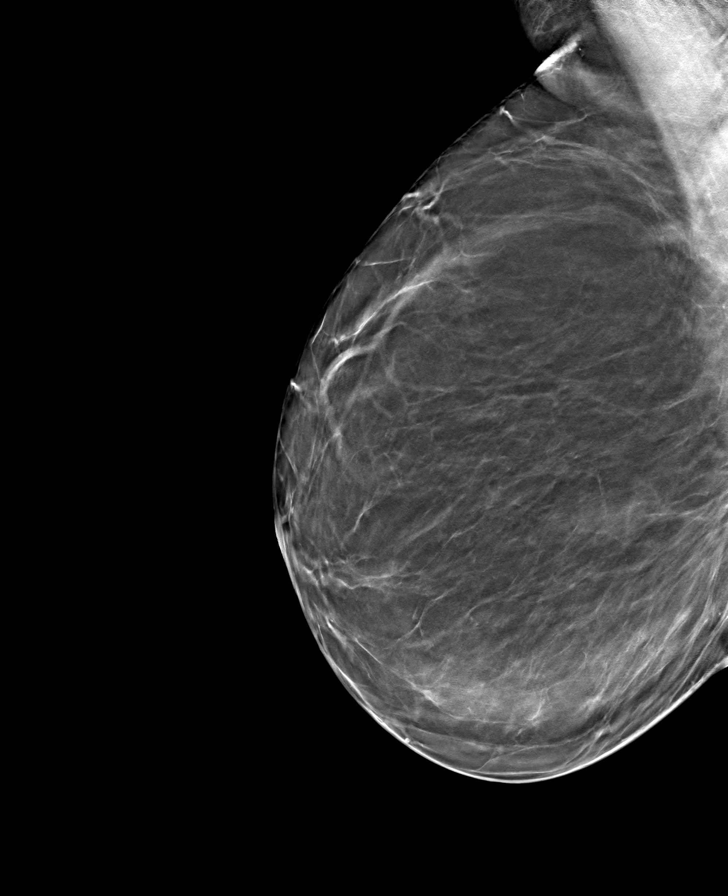

[R CC tomo · tomo slice 35/70.0]
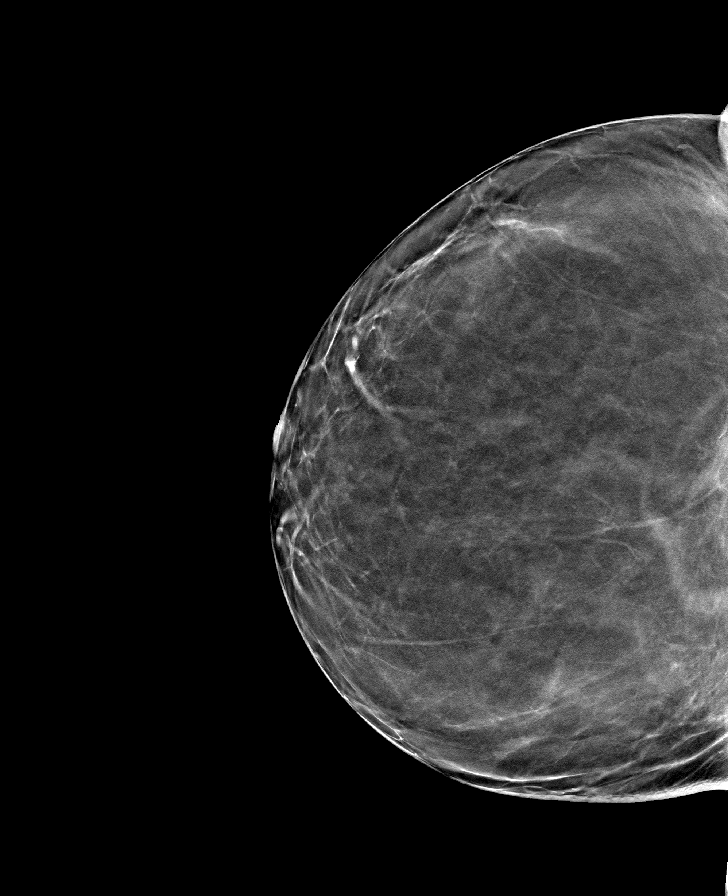

[L MLO tomo · tomo slice 49/96.0]
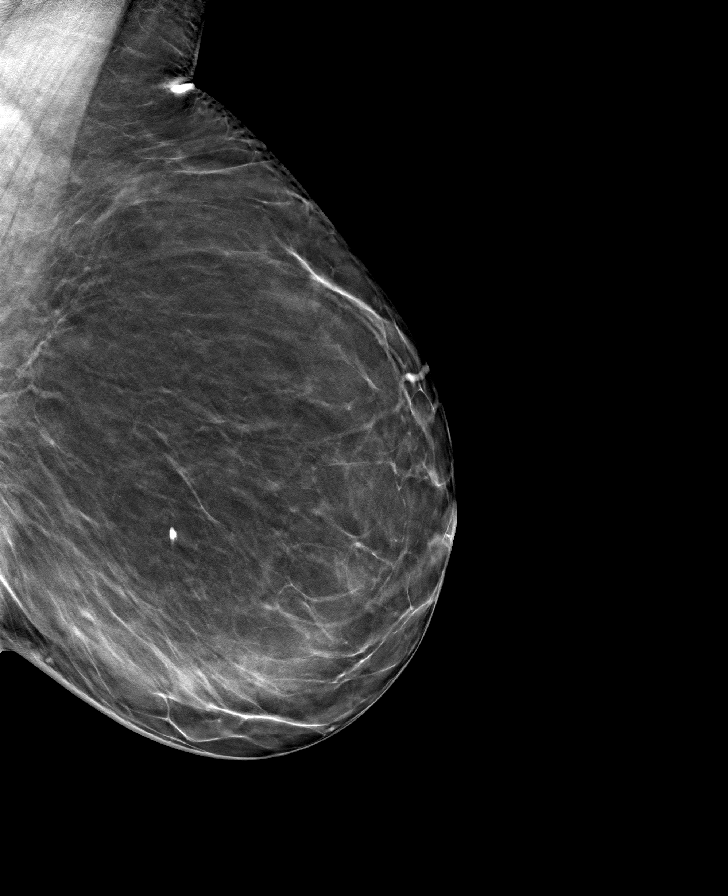

[8 of 24 positions shown; findings below may reference images not displayed]

FINDINGS: There are no findings suspicious for malignancy. Images were
processed with CAD.
IMPRESSION: No mammographic evidence of malignancy. A result letter of this
screening mammogram will be mailed directly to the patient.

RECOMMENDATION:
Screening mammogram in one year. (Code:[id number])

BI-RADS CATEGORY  1: Negative.

## 2020-04-02 ENCOUNTER — Other Ambulatory Visit: Payer: Self-pay

## 2020-04-02 ENCOUNTER — Ambulatory Visit (INDEPENDENT_AMBULATORY_CARE_PROVIDER_SITE_OTHER): Payer: PRIVATE HEALTH INSURANCE

## 2020-04-02 VITALS — BP 130/78 | HR 72 | Temp 96.9°F | Resp 16 | Ht 62.0 in | Wt 160.3 lb

## 2020-04-02 DIAGNOSIS — Z0000 Encounter for general adult medical examination without abnormal findings: Secondary | ICD-10-CM | POA: Diagnosis not present

## 2020-04-02 NOTE — Patient Instructions (Signed)
Paula Mills , Thank you for taking time to come for your Medicare Wellness Visit. I appreciate your ongoing commitment to your health goals. Please review the following plan we discussed and let me know if I can assist you in the future.   Screening recommendations/referrals: Colonoscopy: done 01/31/17. Repeat in 2023 Mammogram: done 10/29/19  Bone Density: done 10/29/19 Recommended yearly ophthalmology/optometry visit for glaucoma screening and checkup Recommended yearly dental visit for hygiene and checkup  Vaccinations: Influenza vaccine: postponed Pneumococcal vaccine: postponed Tdap vaccine: due Shingles vaccine: Shingrix discussed. Please contact your pharmacy for coverage information.  Covid-19:done 10/28/19 7 11/25/19   Advanced directives: Please bring a copy of your health care power of attorney and living will to the office at your convenience.  Conditions/risks identified: Keep up the great work!  Next appointment: Follow up in one year for your annual wellness visit    Preventive Care 65 Years and Older, Female Preventive care refers to lifestyle choices and visits with your health care provider that can promote health and wellness. What does preventive care include?  A yearly physical exam. This is also called an annual well check.  Dental exams once or twice a year.  Routine eye exams. Ask your health care provider how often you should have your eyes checked.  Personal lifestyle choices, including:  Daily care of your teeth and gums.  Regular physical activity.  Eating a healthy diet.  Avoiding tobacco and drug use.  Limiting alcohol use.  Practicing safe sex.  Taking low-dose aspirin every day.  Taking vitamin and mineral supplements as recommended by your health care provider. What happens during an annual well check? The services and screenings done by your health care provider during your annual well check will depend on your age, overall health,  lifestyle risk factors, and family history of disease. Counseling  Your health care provider may ask you questions about your:  Alcohol use.  Tobacco use.  Drug use.  Emotional well-being.  Home and relationship well-being.  Sexual activity.  Eating habits.  History of falls.  Memory and ability to understand (cognition).  Work and work Statistician.  Reproductive health. Screening  You may have the following tests or measurements:  Height, weight, and BMI.  Blood pressure.  Lipid and cholesterol levels. These may be checked every 5 years, or more frequently if you are over 70 years old.  Skin check.  Lung cancer screening. You may have this screening every year starting at age 93 if you have a 30-pack-year history of smoking and currently smoke or have quit within the past 15 years.  Fecal occult blood test (FOBT) of the stool. You may have this test every year starting at age 56.  Flexible sigmoidoscopy or colonoscopy. You may have a sigmoidoscopy every 5 years or a colonoscopy every 10 years starting at age 45.  Hepatitis C blood test.  Hepatitis B blood test.  Sexually transmitted disease (STD) testing.  Diabetes screening. This is done by checking your blood sugar (glucose) after you have not eaten for a while (fasting). You may have this done every 1-3 years.  Bone density scan. This is done to screen for osteoporosis. You may have this done starting at age 42.  Mammogram. This may be done every 1-2 years. Talk to your health care provider about how often you should have regular mammograms. Talk with your health care provider about your test results, treatment options, and if necessary, the need for more tests. Vaccines  Your  health care provider may recommend certain vaccines, such as:  Influenza vaccine. This is recommended every year.  Tetanus, diphtheria, and acellular pertussis (Tdap, Td) vaccine. You may need a Td booster every 10 years.  Zoster  vaccine. You may need this after age 62.  Pneumococcal 13-valent conjugate (PCV13) vaccine. One dose is recommended after age 56.  Pneumococcal polysaccharide (PPSV23) vaccine. One dose is recommended after age 68. Talk to your health care provider about which screenings and vaccines you need and how often you need them. This information is not intended to replace advice given to you by your health care provider. Make sure you discuss any questions you have with your health care provider. Document Released: 10/16/2015 Document Revised: 06/08/2016 Document Reviewed: 07/21/2015 Elsevier Interactive Patient Education  2017 [place] Prevention in the Home Falls can cause injuries. They can happen to people of all ages. There are many things you can do to make your home safe and to help prevent falls. What can I do on the outside of my home?  Regularly fix the edges of walkways and driveways and fix any cracks.  Remove anything that might make you trip as you walk through a door, such as a raised step or threshold.  Trim any bushes or trees on the path to your home.  Use bright outdoor lighting.  Clear any walking paths of anything that might make someone trip, such as rocks or tools.  Regularly check to see if handrails are loose or broken. Make sure that both sides of any steps have handrails.  Any raised decks and porches should have guardrails on the edges.  Have any leaves, snow, or ice cleared regularly.  Use sand or salt on walking paths during winter.  Clean up any spills in your garage right away. This includes oil or grease spills. What can I do in the bathroom?  Use night lights.  Install grab bars by the toilet and in the tub and shower. Do not use towel bars as grab bars.  Use non-skid mats or decals in the tub or shower.  If you need to sit down in the shower, use a plastic, non-slip stool.  Keep the floor dry. Clean up any water that spills on the  floor as soon as it happens.  Remove soap buildup in the tub or shower regularly.  Attach bath mats securely with double-sided non-slip rug tape.  Do not have throw rugs and other things on the floor that can make you trip. What can I do in the bedroom?  Use night lights.  Make sure that you have a light by your bed that is easy to reach.  Do not use any sheets or blankets that are too big for your bed. They should not hang down onto the floor.  Have a firm chair that has side arms. You can use this for support while you get dressed.  Do not have throw rugs and other things on the floor that can make you trip. What can I do in the kitchen?  Clean up any spills right away.  Avoid walking on wet floors.  Keep items that you use a lot in easy-to-reach places.  If you need to reach something above you, use a strong step stool that has a grab bar.  Keep electrical cords out of the way.  Do not use floor polish or wax that makes floors slippery. If you must use wax, use non-skid floor wax.  Do not  have throw rugs and other things on the floor that can make you trip. What can I do with my stairs?  Do not leave any items on the stairs.  Make sure that there are handrails on both sides of the stairs and use them. Fix handrails that are broken or loose. Make sure that handrails are as long as the stairways.  Check any carpeting to make sure that it is firmly attached to the stairs. Fix any carpet that is loose or worn.  Avoid having throw rugs at the top or bottom of the stairs. If you do have throw rugs, attach them to the floor with carpet tape.  Make sure that you have a light switch at the top of the stairs and the bottom of the stairs. If you do not have them, ask someone to add them for you. What else can I do to help prevent falls?  Wear shoes that:  Do not have high heels.  Have rubber bottoms.  Are comfortable and fit you well.  Are closed at the toe. Do not wear  sandals.  If you use a stepladder:  Make sure that it is fully opened. Do not climb a closed stepladder.  Make sure that both sides of the stepladder are locked into place.  Ask someone to hold it for you, if possible.  Clearly mark and make sure that you can see:  Any grab bars or handrails.  First and last steps.  Where the edge of each step is.  Use tools that help you move around (mobility aids) if they are needed. These include:  Canes.  Walkers.  Scooters.  Crutches.  Turn on the lights when you go into a dark area. Replace any light bulbs as soon as they burn out.  Set up your furniture so you have a clear path. Avoid moving your furniture around.  If any of your floors are uneven, fix them.  If there are any pets around you, be aware of where they are.  Review your medicines with your doctor. Some medicines can make you feel dizzy. This can increase your chance of falling. Ask your doctor what other things that you can do to help prevent falls. This information is not intended to replace advice given to you by your health care provider. Make sure you discuss any questions you have with your health care provider. Document Released: 07/16/2009 Document Revised: 02/25/2016 Document Reviewed: 10/24/2014 Elsevier Interactive Patient Education  2017 [employer].

## 2020-04-02 NOTE — Progress Notes (Signed)
Subjective:   Paula Mills is a 73 y.o. female who presents for Medicare Annual (Subsequent) preventive examination.  Review of Systems     Cardiac Risk Factors include: advanced age (>20men, >29 women);dyslipidemia     Objective:    Today's Vitals   04/02/20 1346  BP: 130/78  Pulse: 72  Resp: 16  Temp: (!) 96.9 F (36.1 C)  TempSrc: Temporal  SpO2: 98%  Weight: 160 lb 4.8 oz (72.7 kg)  Height: 5\' 2"  (1.575 m)   Body mass index is 29.32 kg/m.  Advanced Directives 04/02/2020 03/28/2019 03/23/2018 03/20/2017 02/09/2017 10/18/2016 10/14/2016  Does Patient Have a Medical Advance Directive? Yes Yes Yes Yes Yes Yes Yes  Type of Paramedic of [place];Living will [place];Living will [place];Living will Living will [place];Living will [place];Living will [place];Living will  Does patient want to make changes to medical advance directive? - - - - - - -  Copy of [place] in Chart? No - copy requested No - copy requested No - copy requested - No - copy requested - -  Would patient like information on creating a medical advance directive? - - - - - - -    Current Medications (verified) Outpatient Encounter Medications as of 04/02/2020  Medication Sig  . aspirin EC 81 MG tablet Take 1 tablet (81 mg total) by mouth daily. (Patient taking differently: Take 81 mg by mouth daily. Pt taking twice weekly)  . Cholecalciferol (VITAMIN D) 2000 units CAPS Take 2,000 capsules by mouth daily.   . COD LIVER OIL PO Take by mouth.  . Multiple Vitamins-Minerals (WOMENS MULTIVITAMIN PO) Take 1 capsule by mouth daily.   . vitamin B-12 (CYANOCOBALAMIN) 500 MCG tablet Take 1,000 mcg by mouth daily.  . meloxicam (MOBIC) 15 MG tablet TAKE 1 TABLET BY MOUTH EVERY DAY   No facility-administered encounter medications on file as of 04/02/2020.    Allergies  (verified) Patient has no known allergies.   History: Past Medical History:  Diagnosis Date  . Hyperlipidemia   . Vertigo    Past Surgical History:  Procedure Laterality Date  . ABDOMINAL HYSTERECTOMY    . APPENDECTOMY N/A 03/14/2015   Procedure: APPENDECTOMY;  Surgeon: Molly Maduro, MD;  Location: [facility] ORS;  Service: General;  Laterality: N/A;  . CESAREAN SECTION    . COLONOSCOPY WITH PROPOFOL N/A 01/31/2017   Procedure: COLONOSCOPY WITH PROPOFOL;  Surgeon: Lucilla Lame, MD;  Location: [facility] ENDOSCOPY;  Service: Endoscopy;  Laterality: N/A;  . LAPAROTOMY N/A 03/14/2015   Procedure: EXPLORATORY LAPAROTOMY;  Surgeon: Molly Maduro, MD;  Location: [facility] ORS;  Service: General;  Laterality: N/A;   Family History  Problem Relation Age of Onset  . Hyperlipidemia Mother   . Hypertension Mother   . Multiple sclerosis Father   . Hypertension Father   . Hypertension Sister   . Hyperlipidemia Sister   . Multiple sclerosis Sister    Social History   Socioeconomic History  . Marital status: Married    Spouse name: Winferd Humphrey  . Number of children: 2  . Years of education: Not on file  . Highest education level: Master's degree (e.g., MA, MS, MEng, MEd, MSW, MBA)  Occupational History  . Occupation: Retired  Tobacco Use  . Smoking status: Never Smoker  . Smokeless tobacco: Never Used  . Tobacco comment: smoking cessation materials not required  Vaping Use  . Vaping Use:  Never used  Substance and Sexual Activity  . Alcohol use: Not Currently    Alcohol/week: 0.0 standard drinks  . Drug use: No  . Sexual activity: Not Currently    Partners: Male  Other Topics Concern  . Not on file  Social History Narrative  . Not on file   Social Determinants of Health   Financial Resource Strain: Low Risk   . Difficulty of Paying Living Expenses: Not hard at all  Food Insecurity: No Food Insecurity  . Worried About Charity fundraiser in the Last Year: Never true  . Ran Out of Food in  the Last Year: Never true  Transportation Needs: No Transportation Needs  . Lack of Transportation (Medical): No  . Lack of Transportation (Non-Medical): No  Physical Activity: Sufficiently Active  . Days of Exercise per Week: 6 days  . Minutes of Exercise per Session: 90 min  Stress: No Stress Concern Present  . Feeling of Stress : Not at all  Social Connections: Socially Integrated  . Frequency of Communication with Friends and Family: More than three times a week  . Frequency of Social Gatherings with Friends and Family: Three times a week  . Attends Religious Services: More than 4 times per year  . Active Member of Clubs or Organizations: Yes  . Attends Archivist Meetings: More than 4 times per year  . Marital Status: Married    Tobacco Counseling Counseling given: Not Answered Comment: smoking cessation materials not required   Clinical Intake:  Pre-visit preparation completed: Yes  Pain : No/denies pain     BMI - recorded: 29.32 Nutritional Status: BMI 25 -29 Overweight Nutritional Risks: None Diabetes: No  How often do you need to have someone help you when you read instructions, pamphlets, or other written materials from your doctor or pharmacy?: 1 - Never    Interpreter Needed?: No  Information entered by :: Clemetine Marker LPN   Activities of Daily Living In your present state of health, do you have any difficulty performing the following activities: 04/02/2020  Hearing? N  Comment declines hearing aids  Vision? N  Difficulty concentrating or making decisions? N  Walking or climbing stairs? N  Dressing or bathing? N  Doing errands, shopping? N  Preparing Food and eating ? N  Using the Toilet? N  In the past six months, have you accidently leaked urine? N  Do you have problems with loss of bowel control? N  Managing your Medications? N  Managing your Finances? N  Housekeeping or managing your Housekeeping? N  Some recent data might be hidden     Patient Care Team: Steele Sizer, MD as PCP - General (Family Medicine)  Indicate any recent Medical Services you may have received from other than [facility] providers in the past year (date may be approximate).     Assessment:   This is a routine wellness examination for [facility].  Hearing/Vision screen  Hearing Screening   125Hz  250Hz  500Hz  1000Hz  2000Hz  3000Hz  4000Hz  6000Hz  8000Hz   Right ear:           Left ear:           Comments: Pt denies hearing difficulty  Vision Screening Comments: Annual vision screenings done at [facility] issues and exercise activities discussed: Current Exercise Habits: Home exercise routine, Type of exercise: walking, Time (Minutes): > 60, Frequency (Times/Week): 6, Weekly Exercise (Minutes/Week): 0, Intensity: Moderate, Exercise limited by: None identified  Goals    . DIET -  INCREASE WATER INTAKE     Recommend to drink at least 6-8 8oz glasses of water per day.    . Weight (lb) < 150 lb (68 kg)     Pt would like to lose 10 pounds over the next year with healthy eating and physical activity       Depression Screen PHQ 2/9 Scores 04/02/2020 03/28/2019 12/03/2018 03/23/2018 10/18/2017 03/20/2017 03/20/2017  PHQ - 2 Score 0 0 0 0 0 0 0  PHQ- 9 Score - 0 - 0 - 0 -    Fall Risk Fall Risk  04/02/2020 03/29/2019 03/28/2019 12/03/2018 03/23/2018  Falls in the past year? 1 1 1  0 No  Number falls in past yr: 0 1 1 0 -  Comment - - - - -  Injury with Fall? 0 0 0 0 -  Risk for fall due to : No Fall Risks - - - Impaired vision;Other (Comment)  Risk for fall due to: Comment - - - - vertigo, wears eyeglasses  Follow up Falls prevention discussed - Falls prevention discussed - -    Any stairs in or around the home? Yes  If so, are there any without handrails? Yes  Home free of loose throw rugs in walkways, pet beds, electrical cords, etc? Yes  Adequate lighting in your home to reduce risk of falls? Yes   ASSISTIVE DEVICES UTILIZED TO PREVENT FALLS:  Life  alert? No  Use of a cane, walker or w/c? No  Grab bars in the bathroom? No  Shower chair or bench in shower? Yes  Elevated toilet seat or a handicapped toilet? Yes   TIMED UP AND GO:  Was the test performed? Yes .  Length of time to ambulate 10 feet: 5 sec.   Gait steady and fast without use of assistive device  Cognitive Function:     6CIT Screen 03/28/2019 03/23/2018 03/20/2017  What Year? 0 points 0 points 0 points  What month? 0 points 0 points 0 points  What time? 0 points 0 points 0 points  Count back from 20 0 points 0 points 0 points  Months in reverse 0 points 0 points 0 points  Repeat phrase 0 points 2 points 6 points  Total Score 0 2 6    Immunizations Immunization History  Administered Date(s) Administered  . Influenza, Seasonal, Injecte, Preservative Fre 10/01/2013  . Moderna SARS-COVID-2 Vaccination 10/28/2019, 11/25/2019    TDAP status: Due, Education has been provided regarding the importance of this vaccine. Advised may receive this vaccine at local pharmacy or Health Dept. Aware to provide a copy of the vaccination record if obtained from local pharmacy or Health Dept. Verbalized acceptance and understanding.   Flu Vaccine status: Declined, Education has been provided regarding the importance of this vaccine but patient still declined. Advised may receive this vaccine at local pharmacy or Health Dept. Aware to provide a copy of the vaccination record if obtained from local pharmacy or Health Dept. Verbalized acceptance and understanding.   Pneumococcal vaccine status: Declined,  Education has been provided regarding the importance of this vaccine but patient still declined. Advised may receive this vaccine at local pharmacy or Health Dept. Aware to provide a copy of the vaccination record if obtained from local pharmacy or Health Dept. Verbalized acceptance and understanding.    Covid-19 vaccine status: Completed vaccines  Qualifies for Shingles Vaccine? Yes    Zostavax completed No   Shingrix Completed?: No.    Education has been provided regarding the importance  of this vaccine. Patient has been advised to call insurance company to determine out of pocket expense if they have not yet received this vaccine. Advised may also receive vaccine at local pharmacy or Health Dept. Verbalized acceptance and understanding.  Screening Tests Health Maintenance  Topic Date Due  . PNA vac Low Risk Adult (1 of 2 - PCV13) 04/02/2021 (Originally 08/20/2010)  . TETANUS/TDAP  10/03/2026 (Originally 10/03/2016)  . INFLUENZA VACCINE  05/03/2020  . MAMMOGRAM  10/28/2020  . COLONOSCOPY  01/31/2022  . DEXA SCAN  Completed  . COVID-19 Vaccine  Completed  . Hepatitis C Screening  Completed    Health Maintenance  There are no preventive care reminders to display for this patient.  Colorectal cancer screening: Completed 01/31/17. Repeat every 5 years   Mammogram status: Completed 10/29/19. Repeat every year   Bone Density status: Completed 10/29/19. Results reflect: Bone density results: OSTEOPOROSIS. Repeat every 2 years.  Lung Cancer Screening: (Low Dose CT Chest recommended if Age 24-80 years, 30 pack-year currently smoking OR have quit w/in 15years.) does not qualify.    Additional Screening:  Hepatitis C Screening: does qualify; Completed 02/19/13  Vision Screening: Recommended annual ophthalmology exams for early detection of glaucoma and other disorders of the eye. Is the patient up to date with their annual eye exam?  Yes  Who is the provider or what is the name of the office in which the patient attends annual eye exams? [facility]  Dental Screening: Recommended annual dental exams for proper oral hygiene  Community Resource Referral / Chronic Care Management: CRR required this visit?  No   CCM required this visit?  No      Plan:     I have personally reviewed and noted the following in the patient's chart:   . Medical and social history . Use of  alcohol, tobacco or illicit drugs  . Current medications and supplements . Functional ability and status . Nutritional status . Physical activity . Advanced directives . List of other physicians . Hospitalizations, surgeries, and ER visits in previous 12 months . Vitals . Screenings to include cognitive, depression, and falls . Referrals and appointments  In addition, I have reviewed and discussed with patient certain preventive protocols, quality metrics, and best practice recommendations. A written personalized care plan for preventive services as well as general preventive health recommendations were provided to patient.     Clemetine Marker, LPN   05/11/2799   Nurse Notes: pt c/o lower back pain that started in the last week. Pain 4/10 usually first thing in morning and gets better with daily morning walks. Pt states she previously received rx for meloxicam and requests refill. Advised pt to contact pharmacy for request and schedule appt with Dr. Ancil Boozer for yearly visit and labs. Pt appreciative of visit today.

## 2020-04-27 ENCOUNTER — Other Ambulatory Visit: Payer: Self-pay | Admitting: Family Medicine

## 2020-04-27 DIAGNOSIS — M170 Bilateral primary osteoarthritis of knee: Secondary | ICD-10-CM

## 2020-07-29 ENCOUNTER — Ambulatory Visit (INDEPENDENT_AMBULATORY_CARE_PROVIDER_SITE_OTHER): Payer: PRIVATE HEALTH INSURANCE | Admitting: Family Medicine

## 2020-07-29 ENCOUNTER — Other Ambulatory Visit: Payer: Self-pay

## 2020-07-29 ENCOUNTER — Encounter: Payer: Self-pay | Admitting: Family Medicine

## 2020-07-29 VITALS — BP 140/78 | HR 87 | Temp 98.1°F | Resp 16 | Ht 62.0 in | Wt 151.2 lb

## 2020-07-29 DIAGNOSIS — E559 Vitamin D deficiency, unspecified: Secondary | ICD-10-CM

## 2020-07-29 DIAGNOSIS — I6523 Occlusion and stenosis of bilateral carotid arteries: Secondary | ICD-10-CM

## 2020-07-29 DIAGNOSIS — M170 Bilateral primary osteoarthritis of knee: Secondary | ICD-10-CM | POA: Diagnosis not present

## 2020-07-29 DIAGNOSIS — Z0000 Encounter for general adult medical examination without abnormal findings: Secondary | ICD-10-CM | POA: Diagnosis not present

## 2020-07-29 DIAGNOSIS — I700 Atherosclerosis of aorta: Secondary | ICD-10-CM

## 2020-07-29 DIAGNOSIS — Z789 Other specified health status: Secondary | ICD-10-CM

## 2020-07-29 DIAGNOSIS — E785 Hyperlipidemia, unspecified: Secondary | ICD-10-CM | POA: Diagnosis not present

## 2020-07-29 DIAGNOSIS — M810 Age-related osteoporosis without current pathological fracture: Secondary | ICD-10-CM

## 2020-07-29 MED ORDER — ALENDRONATE SODIUM 70 MG PO TABS
70.0000 mg | ORAL_TABLET | ORAL | 3 refills | Status: DC
Start: 2020-07-29 — End: 2021-08-02

## 2020-07-29 NOTE — Progress Notes (Signed)
Name: Paula Mills   MRN: [id number]    DOB: Mar 03, 1946   Date:07/29/2020       Progress Note  Subjective  Chief Complaint  Chief Complaint  Patient presents with  . Annual Exam    HPI  Patient presents for annual CPE and follow up  Hyperlipidemia:LDL is very high, but she does not want to take statins, and does not want to try any other class of medication, she has carotid atherosclerosis , she also has atherosclerosis of aorta , she refuses medication even with the data below   The 10-year ASCVD risk score Mikey Bussing DC Brooke Bonito., et al., 2013) is: 26.9%   Values used to calculate the score:     Age: 74 years     Sex: Female     Is Non-Hispanic African American: Yes     Diabetic: No     Tobacco smoker: No     Systolic Blood Pressure: 726 mmHg     Is BP treated: No     HDL Cholesterol: 74 mg/dL     Total Cholesterol: 297 mg/dL  Osteoporosis: reviewed study done in January with patient and she is wiling to try Alendronate weekly, explained how to take medication, continue vitamin D otc and high calcium diet. Recheck bone density in 1-2 years   Vegetarian : she has a healthy diet, she is still taking B12 supplementation   OA right knee: seen by Ortho had one injection and pain is doing better, no effusion at this time, no longer having problems, able to stay active.   Diet: balanced, vegetarian  Exercise: she walks 6 days a week for 70 minutes, she is now going back to the gym twice a week - for one hour each day - either silver sneakers.     Office Visit from 07/29/2020 in [facility]  AUDIT-C Score 0     Depression: Phq 9 is  negative Depression screen [facility] 2/9 07/29/2020 07/29/2020 04/02/2020 03/28/2019 12/03/2018  Decreased Interest 0 0 0 0 0  Down, Depressed, Hopeless 0 0 0 0 0  PHQ - 2 Score 0 0 0 0 0  Altered sleeping 0 0 - 0 -  Tired, decreased energy 0 0 - 0 -  Change in appetite 0 0 - 0 -  Feeling bad or failure about yourself  0 0 - 0 -  Trouble  concentrating 0 0 - 0 -  Moving slowly or fidgety/restless 0 0 - 0 -  Suicidal thoughts 0 0 - 0 -  PHQ-9 Score 0 0 - 0 -  Difficult doing work/chores Not difficult at all - - Not difficult at all -   Hypertension: BP Readings from Last 3 Encounters:  07/29/20 140/78  04/02/20 130/78  04/23/19 134/86   Obesity: Wt Readings from Last 3 Encounters:  07/29/20 151 lb 3.2 oz (68.6 kg)  04/02/20 160 lb 4.8 oz (72.7 kg)  04/23/19 160 lb 9.6 oz (72.8 kg)   BMI Readings from Last 3 Encounters:  07/29/20 27.65 kg/m  04/02/20 29.32 kg/m  04/23/19 29.37 kg/m     Vaccines:   Shingrix: 46-64 yo and ask insurance if covered when patient above 63 yo - she refused Pneumonia:  educated and discussed with patient. Refused  Flu:  educated and discussed with patient. Refused   Hep C Screening: up to date 2014  STD testing and prevention (HIV/chl/gon/syphilis): not interested  Intimate partner violence: negative  Sexual History : no pain or discomfort  Menstrual  History/LMP/Abnormal Bleeding: s/p hysterectomy  Incontinence Symptoms: only when she hold too long   Breast cancer:  - Last Mammogram: 10/2019  - BRCA gene screening: N/A  Osteoporosis: Discussed high calcium and vitamin D supplementation, weight bearing exercises  Cervical cancer screening: discussed USPTF , no need   Skin cancer: Discussed monitoring for atypical lesions  Colorectal cancer: repeat 2023   Lung cancer:   Low Dose CT Chest recommended if Age 60-80 years, 20 pack-year currently smoking OR have quit w/in 15years. Patient does not qualify.   [id number]  Advanced Care Planning: A voluntary discussion about advance care planning including the explanation and discussion of advance directives.  Discussed health care proxy and Living will, and the patient was able to identify a health care proxy as husband .  Patient does have a living will at present time.  Lipids: Lab Results  Component Value Date   CHOL 297 (H)  03/29/2019   CHOL 309 (H) 03/28/2018   CHOL 291 (H) 10/18/2017   Lab Results  Component Value Date   HDL 74 03/29/2019   HDL 80 03/28/2018   HDL 74 10/18/2017   Lab Results  Component Value Date   LDLCALC 205 (H) 03/29/2019   LDLCALC 208 (H) 03/28/2018   LDLCALC 200 (H) 10/18/2017   Lab Results  Component Value Date   TRIG 70 03/29/2019   TRIG 93 03/28/2018   TRIG 66 10/18/2017   Lab Results  Component Value Date   CHOLHDL 4.0 03/29/2019   CHOLHDL 3.9 03/28/2018   CHOLHDL 3.9 10/18/2017   No results found for: LDLDIRECT  Glucose: Glucose, Bld  Date Value Ref Range Status  03/29/2019 77 65 - 99 mg/dL Final    Comment:    .            Fasting reference interval .   03/28/2018 89 65 - 139 mg/dL Final    Comment:    .        Non-fasting reference interval .   10/18/2017 85 65 - 99 mg/dL Final    Comment:    .            Fasting reference interval .    Glucose-Capillary  Date Value Ref Range Status  03/15/2015 151 (H) 65 - 99 mg/dL Final  03/14/2015 157 (H) 65 - 99 mg/dL Final    Patient Active Problem List   Diagnosis Date Noted  . Screen for colon cancer   . Benign neoplasm of ascending colon   . Polyp of sigmoid colon   . Carotid artery calcification, bilateral 10/18/2016  . At risk for falling 04/08/2016  . H/O: HTN (hypertension) 04/08/2016  . Hypo-ovarianism 04/08/2016  . Asymptomatic varicose veins 04/08/2016  . Avitaminosis D 04/08/2016  . Hiatal hernia 02/09/2016  . Liver cyst 02/09/2016  . Lumbar spondylosis 02/09/2016  . Osteoporosis 02/09/2016  . Dyslipidemia 02/09/2016  . Vegetarian 02/09/2016  . Vitamin D deficiency 02/09/2016  . Calcification of abdominal aorta (HCC) 02/09/2016  . History of small bowel obstruction 03/12/2015    Past Surgical History:  Procedure Laterality Date  . ABDOMINAL HYSTERECTOMY    . APPENDECTOMY N/A 03/14/2015   Procedure: APPENDECTOMY;  Surgeon: Molly Maduro, MD;  Location: [facility] ORS;  Service:  General;  Laterality: N/A;  . CESAREAN SECTION    . COLONOSCOPY WITH PROPOFOL N/A 01/31/2017   Procedure: COLONOSCOPY WITH PROPOFOL;  Surgeon: Lucilla Lame, MD;  Location: [facility] ENDOSCOPY;  Service: Endoscopy;  Laterality: N/A;  . LAPAROTOMY N/A  03/14/2015   Procedure: EXPLORATORY LAPAROTOMY;  Surgeon: Molly Maduro, MD;  Location: [facility] ORS;  Service: General;  Laterality: N/A;    Family History  Problem Relation Age of Onset  . Hyperlipidemia Mother   . Hypertension Mother   . Multiple sclerosis Father   . Hypertension Father   . Hypertension Sister   . Hyperlipidemia Sister   . Multiple sclerosis Sister     Social History   Socioeconomic History  . Marital status: Married    Spouse name: Winferd Humphrey  . Number of children: 2  . Years of education: Not on file  . Highest education level: Master's degree (e.g., MA, MS, MEng, MEd, MSW, MBA)  Occupational History  . Occupation: Retired  Tobacco Use  . Smoking status: Never Smoker  . Smokeless tobacco: Never Used  . Tobacco comment: smoking cessation materials not required  Vaping Use  . Vaping Use: Never used  Substance and Sexual Activity  . Alcohol use: Not Currently    Alcohol/week: 0.0 standard drinks  . Drug use: No  . Sexual activity: Not Currently    Partners: Male  Other Topics Concern  . Not on file  Social History Narrative  . Not on file   Social Determinants of Health   Financial Resource Strain: Low Risk   . Difficulty of Paying Living Expenses: Not hard at all  Food Insecurity: No Food Insecurity  . Worried About Charity fundraiser in the Last Year: Never true  . Ran Out of Food in the Last Year: Never true  Transportation Needs: No Transportation Needs  . Lack of Transportation (Medical): No  . Lack of Transportation (Non-Medical): No  Physical Activity: Sufficiently Active  . Days of Exercise per Week: 6 days  . Minutes of Exercise per Session: 70 min  Stress: No Stress Concern Present  . Feeling  of Stress : Not at all  Social Connections: Socially Integrated  . Frequency of Communication with Friends and Family: More than three times a week  . Frequency of Social Gatherings with Friends and Family: Three times a week  . Attends Religious Services: More than 4 times per year  . Active Member of Clubs or Organizations: Yes  . Attends Archivist Meetings: More than 4 times per year  . Marital Status: Married  Human resources officer Violence: Not At Risk  . Fear of Current or Ex-Partner: No  . Emotionally Abused: No  . Physically Abused: No  . Sexually Abused: No     Current Outpatient Medications:  .  aspirin EC 81 MG tablet, Take 1 tablet (81 mg total) by mouth daily. (Patient taking differently: Take 81 mg by mouth daily. Pt taking twice weekly), Disp: 30 tablet, Rfl: 0 .  Cholecalciferol (VITAMIN D) 2000 units CAPS, Take 2,000 capsules by mouth daily. , Disp: , Rfl:  .  COD LIVER OIL PO, Take by mouth., Disp: , Rfl:  .  Multiple Vitamins-Minerals (WOMENS MULTIVITAMIN PO), Take 1 capsule by mouth daily. , Disp: , Rfl:  .  vitamin B-12 (CYANOCOBALAMIN) 500 MCG tablet, Take 1,000 mcg by mouth daily., Disp: , Rfl:  .  meloxicam (MOBIC) 15 MG tablet, TAKE 1 TABLET BY MOUTH EVERY DAY (Patient not taking: Reported on 07/29/2020), Disp: 30 tablet, Rfl: 0  No Known Allergies   ROS  Constitutional: Negative for fever or weight change.  Respiratory: Negative for cough and shortness of breath.   Cardiovascular: Negative for chest pain or palpitations.  Gastrointestinal: Negative for abdominal  pain, no bowel changes.  Musculoskeletal: Negative for gait problem or joint swelling.  Skin: Negative for rash.  Neurological: Negative for dizziness or headache.  No other specific complaints in a complete review of systems (except as listed in HPI above).  Objective  Vitals:   07/29/20 1332  BP: 140/78  Pulse: 87  Resp: 16  Temp: 98.1 F (36.7 C)  TempSrc: Oral  SpO2: 100%   Weight: 151 lb 3.2 oz (68.6 kg)  Height: $Remove'5\' 2"'AzxTjlx$  (1.575 m)    Body mass index is 27.65 kg/m.  Physical Exam  Constitutional: Patient appears well-developed and well-nourished. No distress.  HENT: Head: Normocephalic and atraumatic. Ears: B TMs ok, no erythema or effusion; Nose: Not done. Mouth/Throat: not done  Eyes: Conjunctivae and EOM are normal. Pupils are equal, round, and reactive to light. No scleral icterus.  Neck: Normal range of motion. Neck supple. No JVD present. No thyromegaly present.  Cardiovascular: Normal rate, regular rhythm and normal heart sounds.  No murmur heard. No BLE edema. Pulmonary/Chest: Effort normal and breath sounds normal. No respiratory distress. Abdominal: Soft. Bowel sounds are normal, no distension. There is no tenderness. no masses Breast: no lumps or masses, no nipple discharge or rashes FEMALE GENITALIA:  Not done RECTAL: not done  Musculoskeletal: Normal range of motion, no joint effusions. No gross deformities Neurological: he is alert and oriented to person, place, and time. No cranial nerve deficit. Coordination, balance, strength, speech and gait are normal.  Skin: Skin is warm and dry. No rash noted. No erythema.  Psychiatric: Patient has a normal mood and affect. behavior is normal. Judgment and thought content normal.  Fall Risk: Fall Risk  07/29/2020 04/02/2020 03/29/2019 03/28/2019 12/03/2018  Falls in the past year? 0 $Remov'1 1 1 'nkWokK$ 0  Number falls in past yr: 0 0 1 1 0  Comment - - - - -  Injury with Fall? 0 0 0 0 0  Risk for fall due to : - No Fall Risks - - -  Risk for fall due to: Comment - - - - -  Follow up - Falls prevention discussed - Falls prevention discussed -     Functional Status Survey: Is the patient deaf or have difficulty hearing?: No Does the patient have difficulty seeing, even when wearing glasses/contacts?: No Does the patient have difficulty concentrating, remembering, or making decisions?: No Does the patient have  difficulty walking or climbing stairs?: No Does the patient have difficulty dressing or bathing?: No Does the patient have difficulty doing errands alone such as visiting a doctor's office or shopping?: No   Assessment & Plan  1. Vegetarian   2. Calcification of abdominal aorta (HCC)  - Lipid panel  3. Primary osteoarthritis of both knees   4. Dyslipidemia  - Lipid panel  5. Carotid artery calcification, bilateral  - Lipid panel  6. Vitamin D deficiency  - VITAMIN D 25 Hydroxy (Vit-D Deficiency, Fractures)  7. Osteoporosis without current pathological fracture, unspecified osteoporosis type  - CBC with Differential/Platelet - COMPLETE METABOLIC PANEL WITH GFR - Parathyroid hormone, intact (no Ca) - VITAMIN D 25 Hydroxy (Vit-D Deficiency, Fractures) - TSH  Discussed medication options, willing to try Alendronate, discussed how to take it and possible side effects, recent visit to dentist and no problems  -USPSTF grade A and B recommendations reviewed with patient; age-appropriate recommendations, preventive care, screening tests, etc discussed and encouraged; healthy living encouraged; see AVS for patient education given to patient -Discussed importance of 150 minutes  of physical activity weekly, eat two servings of fish weekly, eat one serving of tree nuts ( cashews, pistachios, pecans, almonds.Marland Kitchen) every other day, eat 6 servings of fruit/vegetables daily and drink plenty of water and avoid sweet beverages.

## 2020-07-29 NOTE — Patient Instructions (Signed)
Preventive Care 75 Years and Older, Female Preventive care refers to lifestyle choices and visits with your health care provider that can promote health and wellness. This includes:  A yearly physical exam. This is also called an annual well check.  Regular dental and eye exams.  Immunizations.  Screening for certain conditions.  Healthy lifestyle choices, such as diet and exercise. What can I expect for my preventive care visit? Physical exam Your health care provider will check:  Height and weight. These may be used to calculate body mass index (BMI), which is a measurement that tells if you are at a healthy weight.  Heart rate and blood pressure.  Your skin for abnormal spots. Counseling Your health care provider may ask you questions about:  Alcohol, tobacco, and drug use.  Emotional well-being.  Home and relationship well-being.  Sexual activity.  Eating habits.  History of falls.  Memory and ability to understand (cognition).  Work and work Statistician.  Pregnancy and menstrual history. What immunizations do I need?  Influenza (flu) vaccine  This is recommended every year. Tetanus, diphtheria, and pertussis (Tdap) vaccine  You may need a Td booster every 10 years. Varicella (chickenpox) vaccine  You may need this vaccine if you have not already been vaccinated. Zoster (shingles) vaccine  You may need this after age 33. Pneumococcal conjugate (PCV13) vaccine  One dose is recommended after age 33. Pneumococcal polysaccharide (PPSV23) vaccine  One dose is recommended after age 72. Measles, mumps, and rubella (MMR) vaccine  You may need at least one dose of MMR if you were born in 1957 or later. You may also need a second dose. Meningococcal conjugate (MenACWY) vaccine  You may need this if you have certain conditions. Hepatitis A vaccine  You may need this if you have certain conditions or if you travel or work in places where you may be exposed  to hepatitis A. Hepatitis B vaccine  You may need this if you have certain conditions or if you travel or work in places where you may be exposed to hepatitis B. Haemophilus influenzae type b (Hib) vaccine  You may need this if you have certain conditions. You may receive vaccines as individual doses or as more than one vaccine together in one shot (combination vaccines). Talk with your health care provider about the risks and benefits of combination vaccines. What tests do I need? Blood tests  Lipid and cholesterol levels. These may be checked every 5 years, or more frequently depending on your overall health.  Hepatitis C test.  Hepatitis B test. Screening  Lung cancer screening. You may have this screening every year starting at age 39 if you have a 30-pack-year history of smoking and currently smoke or have quit within the past 15 years.  Colorectal cancer screening. All adults should have this screening starting at age 36 and continuing until age 15. Your health care provider may recommend screening at age 23 if you are at increased risk. You will have tests every 1-10 years, depending on your results and the type of screening test.  Diabetes screening. This is done by checking your blood sugar (glucose) after you have not eaten for a while (fasting). You may have this done every 1-3 years.  Mammogram. This may be done every 1-2 years. Talk with your health care provider about how often you should have regular mammograms.  BRCA-related cancer screening. This may be done if you have a family history of breast, ovarian, tubal, or peritoneal cancers.  Other tests  Sexually transmitted disease (STD) testing.  Bone density scan. This is done to screen for osteoporosis. You may have this done starting at age 44. Follow these instructions at home: Eating and drinking  Eat a diet that includes fresh fruits and vegetables, whole grains, lean protein, and low-fat dairy products. Limit  your intake of foods with high amounts of sugar, saturated fats, and salt.  Take vitamin and mineral supplements as recommended by your health care provider.  Do not drink alcohol if your health care provider tells you not to drink.  If you drink alcohol: ? Limit how much you have to 0-1 drink a day. ? Be aware of how much alcohol is in your drink. In the [place] one drink equals one 12 oz bottle of beer (355 mL), one 5 oz glass of wine (148 mL), or one 1 oz glass of hard liquor (44 mL). Lifestyle  Take daily care of your teeth and gums.  Stay active. Exercise for at least 30 minutes on 5 or more days each week.  Do not use any products that contain nicotine or tobacco, such as cigarettes, e-cigarettes, and chewing tobacco. If you need help quitting, ask your health care provider.  If you are sexually active, practice safe sex. Use a condom or other form of protection in order to prevent STIs (sexually transmitted infections).  Talk with your health care provider about taking a low-dose aspirin or statin. What's next?  Go to your health care provider once a year for a well check visit.  Ask your health care provider how often you should have your eyes and teeth checked.  Stay up to date on all vaccines. This information is not intended to replace advice given to you by your health care provider. Make sure you discuss any questions you have with your health care provider. Document Revised: 09/13/2018 Document Reviewed: 09/13/2018 Elsevier Patient Education  2020 [employer].

## 2020-07-30 LAB — CBC WITH DIFFERENTIAL/PLATELET
Absolute Monocytes: 389 cells/uL (ref 200–950)
Basophils Absolute: 22 cells/uL (ref 0–200)
Basophils Relative: 0.4 %
Eosinophils Absolute: 59 cells/uL (ref 15–500)
Eosinophils Relative: 1.1 %
HCT: 38.2 % (ref 35.0–45.0)
Hemoglobin: 12.5 g/dL (ref 11.7–15.5)
Lymphs Abs: 837 cells/uL — ABNORMAL LOW (ref 850–3900)
MCH: 27.7 pg (ref 27.0–33.0)
MCHC: 32.7 g/dL (ref 32.0–36.0)
MCV: 84.5 fL (ref 80.0–100.0)
MPV: 11.5 fL (ref 7.5–12.5)
Monocytes Relative: 7.2 %
Neutro Abs: 4093 cells/uL (ref 1500–7800)
Neutrophils Relative %: 75.8 %
Platelets: 247 10*3/uL (ref 140–400)
RBC: 4.52 10*6/uL (ref 3.80–5.10)
RDW: 13.6 % (ref 11.0–15.0)
Total Lymphocyte: 15.5 %
WBC: 5.4 10*3/uL (ref 3.8–10.8)

## 2020-07-30 LAB — COMPLETE METABOLIC PANEL WITH GFR
AG Ratio: 1.2 (calc) (ref 1.0–2.5)
ALT: 14 U/L (ref 6–29)
AST: 19 U/L (ref 10–35)
Albumin: 4.2 g/dL (ref 3.6–5.1)
Alkaline phosphatase (APISO): 85 U/L (ref 37–153)
BUN: 12 mg/dL (ref 7–25)
CO2: 26 mmol/L (ref 20–32)
Calcium: 9.4 mg/dL (ref 8.6–10.4)
Chloride: 104 mmol/L (ref 98–110)
Creat: 0.65 mg/dL (ref 0.60–0.93)
GFR, Est African American: 101 mL/min/{1.73_m2} (ref >=60)
GFR, Est Non African American: 87 mL/min/{1.73_m2} (ref >=60)
Globulin: 3.6 g/dL (calc) (ref 1.9–3.7)
Glucose, Bld: 84 mg/dL (ref 65–99)
Potassium: 3.9 mmol/L (ref 3.5–5.3)
Sodium: 139 mmol/L (ref 135–146)
Total Bilirubin: 0.4 mg/dL (ref 0.2–1.2)
Total Protein: 7.8 g/dL (ref 6.1–8.1)

## 2020-07-30 LAB — LIPID PANEL
Cholesterol: 302 mg/dL — ABNORMAL HIGH (ref <200)
HDL: 79 mg/dL (ref >=50)
LDL Cholesterol (Calc): 200 mg/dL (calc) — ABNORMAL HIGH
Non-HDL Cholesterol (Calc): 223 mg/dL (calc) — ABNORMAL HIGH (ref <130)
Total CHOL/HDL Ratio: 3.8 (calc) (ref <5.0)
Triglycerides: 99 mg/dL (ref <150)

## 2020-07-30 LAB — VITAMIN D 25 HYDROXY (VIT D DEFICIENCY, FRACTURES): Vit D, 25-Hydroxy: 55 ng/mL (ref 30–100)

## 2020-07-30 LAB — PARATHYROID HORMONE, INTACT (NO CA): PTH: 53 pg/mL (ref 14–64)

## 2020-07-30 LAB — TSH: TSH: 1.6 mIU/L (ref 0.40–4.50)

## 2021-01-18 ENCOUNTER — Telehealth: Payer: Self-pay | Admitting: Family Medicine

## 2021-01-18 NOTE — Telephone Encounter (Signed)
Patient experiencing cough and congestion, no other symptoms. Patient stated PCP prescribed a medication in the past which cleared her symptoms right up. Patient declined appointment with any other provider. Patient PCP has no available appointments until May.     [id number] [address], [place] Phone:  [phone]  Fax:  [phone]

## 2021-01-18 NOTE — Telephone Encounter (Signed)
Tried calling pt but no answer. Patient is wanting to see no one except [facility] and at this time there is no availability.

## 2021-01-25 NOTE — Telephone Encounter (Signed)
Called pt and tried to get her scheduled. Pt states she feels better and will contact our office for any further needs

## 2021-01-25 NOTE — Telephone Encounter (Signed)
Patient called back she was a little upset that she did not hear from anyone after her call last week. She was informed that someone did try and call her but there was no answer. Asking for a call back from the office please at Ph#   [phone]

## 2021-04-06 ENCOUNTER — Ambulatory Visit: Payer: PRIVATE HEALTH INSURANCE

## 2021-04-27 ENCOUNTER — Ambulatory Visit (INDEPENDENT_AMBULATORY_CARE_PROVIDER_SITE_OTHER): Payer: PRIVATE HEALTH INSURANCE

## 2021-04-27 ENCOUNTER — Other Ambulatory Visit: Payer: Self-pay

## 2021-04-27 VITALS — BP 132/82 | HR 62 | Temp 97.8°F | Resp 15 | Ht 62.0 in | Wt 160.5 lb

## 2021-04-27 DIAGNOSIS — Z0000 Encounter for general adult medical examination without abnormal findings: Secondary | ICD-10-CM | POA: Diagnosis not present

## 2021-04-27 DIAGNOSIS — Z1231 Encounter for screening mammogram for malignant neoplasm of breast: Secondary | ICD-10-CM | POA: Diagnosis not present

## 2021-04-27 NOTE — Patient Instructions (Signed)
Paula Mills , Thank you for taking time to come for your Medicare Wellness Visit. I appreciate your ongoing commitment to your health goals. Please review the following plan we discussed and let me know if I can assist you in the future.   Screening recommendations/referrals: Colonoscopy: done 01/31/17. Repeat 01/2022 Mammogram: done 10/29/19. Please call [phone] to schedule your mammogram.  Bone Density: done 10/29/19 Recommended yearly ophthalmology/optometry visit for glaucoma screening and checkup Recommended yearly dental visit for hygiene and checkup  Vaccinations: Influenza vaccine: declined Pneumococcal vaccine: declined Tdap vaccine: due Shingles vaccine: Shingrix discussed. Please contact your pharmacy for coverage information.  Covid-19:done 10/28/19, 11/25/19 & 07/28/20  Advanced directives: Advance directive discussed with you today. I have provided a copy for you to complete at home and have notarized. Once this is complete please bring a copy in to our office so we can scan it into your chart.   Conditions/risks identified: Recommend healthy eating and physical activity for desired weight loss  Next appointment: Follow up in one year for your annual wellness visit    Preventive Care 65 Years and Older, Female Preventive care refers to lifestyle choices and visits with your health care provider that can promote health and wellness. What does preventive care include? A yearly physical exam. This is also called an annual well check. Dental exams once or twice a year. Routine eye exams. Ask your health care provider how often you should have your eyes checked. Personal lifestyle choices, including: Daily care of your teeth and gums. Regular physical activity. Eating a healthy diet. Avoiding tobacco and drug use. Limiting alcohol use. Practicing safe sex. Taking low-dose aspirin every day. Taking vitamin and mineral supplements as recommended by your health care  provider. What happens during an annual well check? The services and screenings done by your health care provider during your annual well check will depend on your age, overall health, lifestyle risk factors, and family history of disease. Counseling  Your health care provider may ask you questions about your: Alcohol use. Tobacco use. Drug use. Emotional well-being. Home and relationship well-being. Sexual activity. Eating habits. History of falls. Memory and ability to understand (cognition). Work and work Statistician. Reproductive health. Screening  You may have the following tests or measurements: Height, weight, and BMI. Blood pressure. Lipid and cholesterol levels. These may be checked every 5 years, or more frequently if you are over 97 years old. Skin check. Lung cancer screening. You may have this screening every year starting at age 54 if you have a 30-pack-year history of smoking and currently smoke or have quit within the past 15 years. Fecal occult blood test (FOBT) of the stool. You may have this test every year starting at age 9. Flexible sigmoidoscopy or colonoscopy. You may have a sigmoidoscopy every 5 years or a colonoscopy every 10 years starting at age 38. Hepatitis C blood test. Hepatitis B blood test. Sexually transmitted disease (STD) testing. Diabetes screening. This is done by checking your blood sugar (glucose) after you have not eaten for a while (fasting). You may have this done every 1-3 years. Bone density scan. This is done to screen for osteoporosis. You may have this done starting at age 46. Mammogram. This may be done every 1-2 years. Talk to your health care provider about how often you should have regular mammograms. Talk with your health care provider about your test results, treatment options, and if necessary, the need for more tests. Vaccines  Your health care provider may  recommend certain vaccines, such as: Influenza vaccine. This is  recommended every year. Tetanus, diphtheria, and acellular pertussis (Tdap, Td) vaccine. You may need a Td booster every 10 years. Zoster vaccine. You may need this after age 68. Pneumococcal 13-valent conjugate (PCV13) vaccine. One dose is recommended after age 67. Pneumococcal polysaccharide (PPSV23) vaccine. One dose is recommended after age 5. Talk to your health care provider about which screenings and vaccines you need and how often you need them. This information is not intended to replace advice given to you by your health care provider. Make sure you discuss any questions you have with your health care provider. Document Released: 10/16/2015 Document Revised: 06/08/2016 Document Reviewed: 07/21/2015 Elsevier Interactive Patient Education  2017 [place] Prevention in the Home Falls can cause injuries. They can happen to people of all ages. There are many things you can do to make your home safe and to help prevent falls. What can I do on the outside of my home? Regularly fix the edges of walkways and driveways and fix any cracks. Remove anything that might make you trip as you walk through a door, such as a raised step or threshold. Trim any bushes or trees on the path to your home. Use bright outdoor lighting. Clear any walking paths of anything that might make someone trip, such as rocks or tools. Regularly check to see if handrails are loose or broken. Make sure that both sides of any steps have handrails. Any raised decks and porches should have guardrails on the edges. Have any leaves, snow, or ice cleared regularly. Use sand or salt on walking paths during winter. Clean up any spills in your garage right away. This includes oil or grease spills. What can I do in the bathroom? Use night lights. Install grab bars by the toilet and in the tub and shower. Do not use towel bars as grab bars. Use non-skid mats or decals in the tub or shower. If you need to sit down in  the shower, use a plastic, non-slip stool. Keep the floor dry. Clean up any water that spills on the floor as soon as it happens. Remove soap buildup in the tub or shower regularly. Attach bath mats securely with double-sided non-slip rug tape. Do not have throw rugs and other things on the floor that can make you trip. What can I do in the bedroom? Use night lights. Make sure that you have a light by your bed that is easy to reach. Do not use any sheets or blankets that are too big for your bed. They should not hang down onto the floor. Have a firm chair that has side arms. You can use this for support while you get dressed. Do not have throw rugs and other things on the floor that can make you trip. What can I do in the kitchen? Clean up any spills right away. Avoid walking on wet floors. Keep items that you use a lot in easy-to-reach places. If you need to reach something above you, use a strong step stool that has a grab bar. Keep electrical cords out of the way. Do not use floor polish or wax that makes floors slippery. If you must use wax, use non-skid floor wax. Do not have throw rugs and other things on the floor that can make you trip. What can I do with my stairs? Do not leave any items on the stairs. Make sure that there are handrails on both sides of  the stairs and use them. Fix handrails that are broken or loose. Make sure that handrails are as long as the stairways. Check any carpeting to make sure that it is firmly attached to the stairs. Fix any carpet that is loose or worn. Avoid having throw rugs at the top or bottom of the stairs. If you do have throw rugs, attach them to the floor with carpet tape. Make sure that you have a light switch at the top of the stairs and the bottom of the stairs. If you do not have them, ask someone to add them for you. What else can I do to help prevent falls? Wear shoes that: Do not have high heels. Have rubber bottoms. Are comfortable  and fit you well. Are closed at the toe. Do not wear sandals. If you use a stepladder: Make sure that it is fully opened. Do not climb a closed stepladder. Make sure that both sides of the stepladder are locked into place. Ask someone to hold it for you, if possible. Clearly mark and make sure that you can see: Any grab bars or handrails. First and last steps. Where the edge of each step is. Use tools that help you move around (mobility aids) if they are needed. These include: Canes. Walkers. Scooters. Crutches. Turn on the lights when you go into a dark area. Replace any light bulbs as soon as they burn out. Set up your furniture so you have a clear path. Avoid moving your furniture around. If any of your floors are uneven, fix them. If there are any pets around you, be aware of where they are. Review your medicines with your doctor. Some medicines can make you feel dizzy. This can increase your chance of falling. Ask your doctor what other things that you can do to help prevent falls. This information is not intended to replace advice given to you by your health care provider. Make sure you discuss any questions you have with your health care provider. Document Released: 07/16/2009 Document Revised: 02/25/2016 Document Reviewed: 10/24/2014 Elsevier Interactive Patient Education  2017 [employer].

## 2021-04-27 NOTE — Progress Notes (Signed)
Subjective:   Paula Mills is a 75 y.o. female who presents for Medicare Annual (Subsequent) preventive examination.    Review of Systems     Cardiac Risk Factors include: advanced age (>62mn, >>61women);dyslipidemia     Objective:    Today's Vitals   04/27/21 0833  BP: 132/82  Pulse: 62  Resp: 15  Temp: 97.8 F (36.6 C)  TempSrc: Oral  SpO2: 99%  Weight: 160 lb 8 oz (72.8 kg)  Height: '5\' 2"'$  (1.575 m)   Body mass index is 29.36 kg/m.  Advanced Directives 04/27/2021 04/02/2020 03/28/2019 03/23/2018 03/20/2017 02/09/2017 10/18/2016  Does Patient Have a Medical Advance Directive? Yes Yes Yes Yes Yes Yes Yes  Type of AParamedicof APlainvilleLiving will HMount VernonLiving will HDavisLiving will HSalt PointLiving will Living will HTutuillaLiving will HGulfcrestLiving will  Does patient want to make changes to medical advance directive? Yes (MAU/Ambulatory/Procedural Areas - Information given) - - - - - -  Copy of HJasperin Chart? No - copy requested No - copy requested No - copy requested No - copy requested - No - copy requested -  Would patient like information on creating a medical advance directive? - - - - - - -    Current Medications (verified) Outpatient Encounter Medications as of 04/27/2021  Medication Sig   Cholecalciferol (VITAMIN D) 2000 units CAPS Take 2,000 capsules by mouth daily.    COD LIVER OIL PO Take by mouth.   Multiple Vitamins-Minerals (WOMENS MULTIVITAMIN PO) Take 1 capsule by mouth daily.    vitamin B-12 (CYANOCOBALAMIN) 500 MCG tablet Take 1,000 mcg by mouth daily.   alendronate (FOSAMAX) 70 MG tablet Take 1 tablet (70 mg total) by mouth every 7 (seven) days. Take with a full glass of water on an empty stomach. (Patient not taking: Reported on 04/27/2021)   [DISCONTINUED] aspirin EC 81 MG tablet Take 1 tablet (81 mg  total) by mouth daily. (Patient taking differently: Take 81 mg by mouth daily. Pt taking twice weekly)   No facility-administered encounter medications on file as of 04/27/2021.    Allergies (verified) Patient has no known allergies.   History: Past Medical History:  Diagnosis Date   Hyperlipidemia    Vertigo    Past Surgical History:  Procedure Laterality Date   ABDOMINAL HYSTERECTOMY     APPENDECTOMY N/A 03/14/2015   Procedure: APPENDECTOMY;  Surgeon: WMolly Maduro MD;  Location: [facility] ORS;  Service: General;  Laterality: N/A;   CESAREAN SECTION     COLONOSCOPY WITH PROPOFOL N/A 01/31/2017   Procedure: COLONOSCOPY WITH PROPOFOL;  Surgeon: DLucilla Lame MD;  Location: [facility] ENDOSCOPY;  Service: Endoscopy;  Laterality: N/A;   LAPAROTOMY N/A 03/14/2015   Procedure: EXPLORATORY LAPAROTOMY;  Surgeon: WMolly Maduro MD;  Location: [facility] ORS;  Service: General;  Laterality: N/A;   Family History  Problem Relation Age of Onset   Hyperlipidemia Mother    Hypertension Mother    Dementia Mother    Multiple sclerosis Father    Hypertension Father    Hypertension Sister    Hyperlipidemia Sister    Multiple sclerosis Sister    Social History   Socioeconomic History   Marital status: Married    Spouse name: NWinferd Humphrey  Number of children: 2   Years of education: Not on file   Highest education level: Master's degree (e.g., MA, MS, MEng, MEd, MSW, MBA)  Occupational History   Occupation: Retired  Tobacco Use   Smoking status: Never   Smokeless tobacco: Never  Vaping Use   Vaping Use: Never used  Substance and Sexual Activity   Alcohol use: Not Currently    Alcohol/week: 0.0 standard drinks   Drug use: No   Sexual activity: Not Currently    Partners: Male  Other Topics Concern   Not on file  Social History Narrative   Husband has a pacemaker    Social Determinants of Radio broadcast assistant Strain: Low Risk    Difficulty of Paying Living Expenses: Not hard at all   Food Insecurity: No Food Insecurity   Worried About Charity fundraiser in the Last Year: Never true   [place] in the Last Year: Never true  Transportation Needs: No Transportation Needs   Lack of Transportation (Medical): No   Lack of Transportation (Non-Medical): No  Physical Activity: Sufficiently Active   Days of Exercise per Week: 6 days   Minutes of Exercise per Session: 60 min  Stress: No Stress Concern Present   Feeling of Stress : Not at all  Social Connections: Socially Integrated   Frequency of Communication with Friends and Family: More than three times a week   Frequency of Social Gatherings with Friends and Family: Three times a week   Attends Religious Services: More than 4 times per year   Active Member of Clubs or Organizations: Yes   Attends Music therapist: More than 4 times per year   Marital Status: Married    Tobacco Counseling Counseling given: Not Answered   Clinical Intake:  Pre-visit preparation completed: Yes  Pain : No/denies pain     BMI - recorded: 29.36 Nutritional Status: BMI 25 -29 Overweight Nutritional Risks: None Diabetes: No  How often do you need to have someone help you when you read instructions, pamphlets, or other written materials from your doctor or pharmacy?: 1 - Never   Interpreter Needed?: No  Information entered by :: Clemetine Marker LPN   Activities of Daily Living In your present state of health, do you have any difficulty performing the following activities: 04/27/2021 07/29/2020  Hearing? N N  Comment declines hearing aids -  Vision? N N  Difficulty concentrating or making decisions? N N  Walking or climbing stairs? N N  Dressing or bathing? N N  Doing errands, shopping? N N  Preparing Food and eating ? N -  Using the Toilet? N -  In the past six months, have you accidently leaked urine? Y -  Comment occasional urge incontinence -  Do you have problems with loss of bowel control? N -   Managing your Medications? N -  Managing your Finances? N -  Housekeeping or managing your Housekeeping? N -  Some recent data might be hidden    Patient Care Team: Steele Sizer, MD as PCP - General (Family Medicine)  Indicate any recent Medical Services you may have received from other than [facility] providers in the past year (date may be approximate).     Assessment:   This is a routine wellness examination for [facility].  Hearing/Vision screen Hearing Screening - Comments:: Pt denies hearing difficulty Vision Screening - Comments:: Annual vision screenings done at [place] issues and exercise activities discussed: Current Exercise Habits: Home exercise routine, Type of exercise: walking;yoga;strength training/weights;calisthenics, Time (Minutes): 60, Frequency (Times/Week): 6, Weekly Exercise (Minutes/Week): 360, Intensity: Moderate, Exercise limited by: None identified  Goals Addressed             This Visit's Progress    DIET - INCREASE WATER INTAKE   On track    Recommend to drink at least 6-8 8oz glasses of water per day.        Depression Screen PHQ 2/9 Scores 04/27/2021 07/29/2020 07/29/2020 04/02/2020 03/28/2019 12/03/2018 03/23/2018  PHQ - 2 Score 0 0 0 0 0 0 0  PHQ- 9 Score - 0 0 - 0 - 0    Fall Risk Fall Risk  04/27/2021 07/29/2020 04/02/2020 03/29/2019 03/28/2019  Falls in the past year? 0 '1 1 1 1  '$ Number falls in past yr: 0 1 0 1 1  Comment - - - - -  Injury with Fall? 0 0 0 0 0  Risk for fall due to : No Fall Risks History of fall(s) No Fall Risks - -  Risk for fall due to: Comment - - - - -  Follow up Falls prevention discussed Education provided Falls prevention discussed - Falls prevention discussed    FALL RISK PREVENTION PERTAINING TO THE HOME:  Any stairs in or around the home? Yes  If so, are there any without handrails? No  Home free of loose throw rugs in walkways, pet beds, electrical cords, etc? Yes  Adequate lighting in your home to reduce  risk of falls? Yes   ASSISTIVE DEVICES UTILIZED TO PREVENT FALLS:  Life alert? No  Use of a cane, walker or w/c? No  Grab bars in the bathroom? No  Shower chair or bench in shower? Yes  Elevated toilet seat or a handicapped toilet? Yes   TIMED UP AND GO:  Was the test performed? Yes .  Length of time to ambulate 10 feet: 5 sec.   Gait steady and fast without use of assistive device  Cognitive Function: Normal cognitive status assessed by direct observation by this Nurse Health Advisor. No abnormalities found.       6CIT Screen 03/28/2019 03/23/2018 03/20/2017  What Year? 0 points 0 points 0 points  What month? 0 points 0 points 0 points  What time? 0 points 0 points 0 points  Count back from 20 0 points 0 points 0 points  Months in reverse 0 points 0 points 0 points  Repeat phrase 0 points 2 points 6 points  Total Score 0 2 6    Immunizations Immunization History  Administered Date(s) Administered   Influenza, Seasonal, Injecte, Preservative Fre 10/01/2013   Moderna Sars-Covid-2 Vaccination 10/28/2019, 11/25/2019   PFIZER(Purple Top)SARS-COV-2 Vaccination 07/28/2020    TDAP status: Due, Education has been provided regarding the importance of this vaccine. Advised may receive this vaccine at local pharmacy or Health Dept. Aware to provide a copy of the vaccination record if obtained from local pharmacy or Health Dept. Verbalized acceptance and understanding.  Flu Vaccine status: Declined, Education has been provided regarding the importance of this vaccine but patient still declined. Advised may receive this vaccine at local pharmacy or Health Dept. Aware to provide a copy of the vaccination record if obtained from local pharmacy or Health Dept. Verbalized acceptance and understanding.  Pneumococcal vaccine status: Declined,  Education has been provided regarding the importance of this vaccine but patient still declined. Advised may receive this vaccine at local pharmacy or  Health Dept. Aware to provide a copy of the vaccination record if obtained from local pharmacy or Health Dept. Verbalized acceptance and understanding.   Covid-19 vaccine status: Completed vaccines  Qualifies for Shingles Vaccine? Yes   Zostavax completed No   Shingrix Completed?: No.    Education has been provided regarding the importance of this vaccine. Patient has been advised to call insurance company to determine out of pocket expense if they have not yet received this vaccine. Advised may also receive vaccine at local pharmacy or Health Dept. Verbalized acceptance and understanding.  Screening Tests Health Maintenance  Topic Date Due   Zoster Vaccines- Shingrix (1 of 2) Never done   MAMMOGRAM  10/28/2020   COVID-19 Vaccine (4 - Booster for Moderna series) 11/28/2020   PNA vac Low Risk Adult (1 of 2 - PCV13) 04/27/2022 (Originally 08/20/2010)   TETANUS/TDAP  10/03/2026 (Originally 10/03/2016)   INFLUENZA VACCINE  05/03/2021   COLONOSCOPY (Pts 45-7yr Insurance coverage will need to be confirmed)  01/31/2022   DEXA SCAN  Completed   Hepatitis C Screening  Completed   HPV VACCINES  Aged Out    Health Maintenance  Health Maintenance Due  Topic Date Due   Zoster Vaccines- Shingrix (1 of 2) Never done   MAMMOGRAM  10/28/2020   COVID-19 Vaccine (4 - Booster for Moderna series) 11/28/2020    Colorectal cancer screening: Type of screening: Colonoscopy. Completed 01/31/17. Repeat every 5 years  Mammogram status: Completed 10/29/19. Repeat every year. Ordered today.   Bone Density status: Completed 10/29/19. Results reflect: Bone density results: OSTEOPOROSIS. Repeat every 2 years.  Lung Cancer Screening: (Low Dose CT Chest recommended if Age 76-80years, 30 pack-year currently smoking OR have quit w/in 15years.) does not qualify.   Additional Screening:  Hepatitis C Screening: does qualify; Completed 02/19/13  Vision Screening: Recommended annual ophthalmology exams for early  detection of glaucoma and other disorders of the eye. Is the patient up to date with their annual eye exam?  Yes  Who is the provider or what is the name of the office in which the patient attends annual eye exams? [facility].   Dental Screening: Recommended annual dental exams for proper oral hygiene  Community Resource Referral / Chronic Care Management: CRR required this visit?  No   CCM required this visit?  No      Plan:     I have personally reviewed and noted the following in the patient's chart:   Medical and social history Use of alcohol, tobacco or illicit drugs  Current medications and supplements including opioid prescriptions.  Functional ability and status Nutritional status Physical activity Advanced directives List of other physicians Hospitalizations, surgeries, and ER visits in previous 12 months Vitals Screenings to include cognitive, depression, and falls Referrals and appointments  In addition, I have reviewed and discussed with patient certain preventive protocols, quality metrics, and best practice recommendations. A written personalized care plan for preventive services as well as general preventive health recommendations were provided to patient.     KClemetine Marker LPN   [id number]  Nurse Notes: none

## 2021-08-02 ENCOUNTER — Other Ambulatory Visit: Payer: Self-pay

## 2021-08-02 ENCOUNTER — Encounter: Payer: Self-pay | Admitting: Family Medicine

## 2021-08-02 ENCOUNTER — Ambulatory Visit (INDEPENDENT_AMBULATORY_CARE_PROVIDER_SITE_OTHER): Payer: Medicare HMO | Admitting: Family Medicine

## 2021-08-02 VITALS — BP 128/86 | HR 71 | Temp 97.8°F | Resp 14 | Ht 62.0 in | Wt 159.2 lb

## 2021-08-02 DIAGNOSIS — M170 Bilateral primary osteoarthritis of knee: Secondary | ICD-10-CM

## 2021-08-02 DIAGNOSIS — I700 Atherosclerosis of aorta: Secondary | ICD-10-CM | POA: Diagnosis not present

## 2021-08-02 DIAGNOSIS — Z1231 Encounter for screening mammogram for malignant neoplasm of breast: Secondary | ICD-10-CM | POA: Diagnosis not present

## 2021-08-02 DIAGNOSIS — M810 Age-related osteoporosis without current pathological fracture: Secondary | ICD-10-CM | POA: Diagnosis not present

## 2021-08-02 DIAGNOSIS — Z789 Other specified health status: Secondary | ICD-10-CM

## 2021-08-02 DIAGNOSIS — Z0000 Encounter for general adult medical examination without abnormal findings: Secondary | ICD-10-CM

## 2021-08-02 DIAGNOSIS — E785 Hyperlipidemia, unspecified: Secondary | ICD-10-CM

## 2021-08-02 DIAGNOSIS — I6523 Occlusion and stenosis of bilateral carotid arteries: Secondary | ICD-10-CM

## 2021-08-02 DIAGNOSIS — E559 Vitamin D deficiency, unspecified: Secondary | ICD-10-CM

## 2021-08-02 MED ORDER — ALENDRONATE SODIUM 70 MG PO TABS: 70.0000 mg | ORAL_TABLET | ORAL | 3 refills | Status: DC

## 2021-08-02 NOTE — Progress Notes (Signed)
Name: Paula Mills   MRN: [id number]    DOB: September 09, 1946   Date:08/02/2021       Progress Note  Subjective  Chief Complaint  Chief Complaint  Patient presents with   Annual Exam    HPI  Patient presents for annual CPE and follow up  Atherosclerosis or Aorta/carotid atherosclerosis/Hyperlipidemia: she states dyslipidemia occurs in her family but mother was 39 and died from aneurysm of brain. Father died at 54 from complications of MS  OA: seen by ortho, going to the gym, no longer has pain   Osteoporosis: explained the results from last year, getting worse, she is Alendronate but not taking every week. Discussed other options , she will try to take it weekly and recheck bone density, she states she does not like taking medications   Diet: vegetarian , reminded her to continue B12 supplementation  Exercise: continue regular physical activity    Flowsheet Row Clinical Support from 04/27/2021 in [facility]  AUDIT-C Score 0      Depression: Phq 9 is  negative Depression screen [facility] 2/9 08/02/2021 04/27/2021 07/29/2020 07/29/2020 04/02/2020  Decreased Interest 0 0 0 0 0  Down, Depressed, Hopeless 0 0 0 0 0  PHQ - 2 Score 0 0 0 0 0  Altered sleeping 0 - 0 0 -  Tired, decreased energy 0 - 0 0 -  Change in appetite 0 - 0 0 -  Feeling bad or failure about yourself  0 - 0 0 -  Trouble concentrating 0 - 0 0 -  Moving slowly or fidgety/restless 0 - 0 0 -  Suicidal thoughts 0 - 0 0 -  PHQ-9 Score 0 - 0 0 -  Difficult doing work/chores - - Not difficult at all - -   Hypertension: BP Readings from Last 3 Encounters:  08/02/21 128/86  04/27/21 132/82  07/29/20 140/78   Obesity: Wt Readings from Last 3 Encounters:  08/02/21 159 lb 3.2 oz (72.2 kg)  04/27/21 160 lb 8 oz (72.8 kg)  07/29/20 151 lb 3.2 oz (68.6 kg)   BMI Readings from Last 3 Encounters:  08/02/21 29.12 kg/m  04/27/21 29.36 kg/m  07/29/20 27.65 kg/m     Vaccines:   Shingrix: refused   Pneumonia: refused  Flu: refused  COVID-19:  discussed booster  Hep C Screening: up to date  STD testing and prevention (HIV/chl/gon/syphilis): Not interested  Intimate partner violence:negative screen  Sexual History : one partner, husband, no pain or discomfort  Menstrual History/LMP/Abnormal Bleeding: discussed post-menopausal bleeding  Incontinence Symptoms: no problems   Breast cancer:  - Last Mammogram: ordered  - BRCA gene screening: N/A  Osteoporosis: Discussed high calcium and vitamin D supplementation, weight bearing exercises  Cervical cancer screening: N/A  Skin cancer: Discussed monitoring for atypical lesions  Colorectal cancer:  up to date, repeat in 2023   Lung cancer:   Low Dose CT Chest recommended if Age 68-80 years, 20 pack-year currently smoking OR have quit w/in 15years. Patient does not qualify.   ECG: 04/2017  Advanced Care Planning: A voluntary discussion about advance care planning including the explanation and discussion of advance directives.  Discussed health care proxy and Living will, and the patient was able to identify a health care proxy as husband   Lipids: Lab Results  Component Value Date   CHOL 302 (H) 07/29/2020   CHOL 297 (H) 03/29/2019   CHOL 309 (H) 03/28/2018   Lab Results  Component Value Date   HDL  79 07/29/2020   HDL 74 03/29/2019   HDL 80 03/28/2018   Lab Results  Component Value Date   LDLCALC 200 (H) 07/29/2020   LDLCALC 205 (H) 03/29/2019   LDLCALC 208 (H) 03/28/2018   Lab Results  Component Value Date   TRIG 99 07/29/2020   TRIG 70 03/29/2019   TRIG 93 03/28/2018   Lab Results  Component Value Date   CHOLHDL 3.8 07/29/2020   CHOLHDL 4.0 03/29/2019   CHOLHDL 3.9 03/28/2018   No results found for: LDLDIRECT  Glucose: Glucose, Bld  Date Value Ref Range Status  07/29/2020 84 65 - 99 mg/dL Final    Comment:    .            Fasting reference interval .   03/29/2019 77 65 - 99 mg/dL Final    Comment:     .            Fasting reference interval .   03/28/2018 89 65 - 139 mg/dL Final    Comment:    .        Non-fasting reference interval .    Glucose-Capillary  Date Value Ref Range Status  03/15/2015 151 (H) 65 - 99 mg/dL Final  03/14/2015 157 (H) 65 - 99 mg/dL Final    Patient Active Problem List   Diagnosis Date Noted   Screen for colon cancer    Benign neoplasm of ascending colon    Polyp of sigmoid colon    Carotid artery calcification, bilateral 10/18/2016   At risk for falling 04/08/2016   H/O: HTN (hypertension) 04/08/2016   Hypo-ovarianism 04/08/2016   Asymptomatic varicose veins 04/08/2016   Avitaminosis D 04/08/2016   Hiatal hernia 02/09/2016   Liver cyst 02/09/2016   Lumbar spondylosis 02/09/2016   Osteoporosis 02/09/2016   Dyslipidemia 02/09/2016   Vegetarian 02/09/2016   Vitamin D deficiency 02/09/2016   Calcification of abdominal aorta ([place]) 02/09/2016   History of small bowel obstruction 03/12/2015    Past Surgical History:  Procedure Laterality Date   ABDOMINAL HYSTERECTOMY     APPENDECTOMY N/A 03/14/2015   Procedure: APPENDECTOMY;  Surgeon: Molly Maduro, MD;  Location: [facility] ORS;  Service: General;  Laterality: N/A;   CESAREAN SECTION     COLONOSCOPY WITH PROPOFOL N/A 01/31/2017   Procedure: COLONOSCOPY WITH PROPOFOL;  Surgeon: Lucilla Lame, MD;  Location: [facility] ENDOSCOPY;  Service: Endoscopy;  Laterality: N/A;   LAPAROTOMY N/A 03/14/2015   Procedure: EXPLORATORY LAPAROTOMY;  Surgeon: Molly Maduro, MD;  Location: [facility] ORS;  Service: General;  Laterality: N/A;    Family History  Problem Relation Age of Onset   Hyperlipidemia Mother    Hypertension Mother    Dementia Mother    Multiple sclerosis Father    Hypertension Father    Hypertension Sister    Hyperlipidemia Sister    Multiple sclerosis Sister     Social History   Socioeconomic History   Marital status: Married    Spouse name: Winferd Humphrey   Number of children: 2   Years of  education: Not on file   Highest education level: Master's degree (e.g., MA, MS, MEng, MEd, MSW, MBA)  Occupational History   Occupation: Retired  Tobacco Use   Smoking status: Never   Smokeless tobacco: Never  Vaping Use   Vaping Use: Never used  Substance and Sexual Activity   Alcohol use: Not Currently    Alcohol/week: 0.0 standard drinks   Drug use: No   Sexual activity: Not Currently  Partners: Male  Other Topics Concern   Not on file  Social History Narrative   Husband has a pacemaker    Social Determinants of Radio broadcast assistant Strain: Low Risk    Difficulty of Paying Living Expenses: Not hard at all  Food Insecurity: No Food Insecurity   Worried About Charity fundraiser in the Last Year: Never true   Arboriculturist in the Last Year: Never true  Transportation Needs: No Transportation Needs   Lack of Transportation (Medical): No   Lack of Transportation (Non-Medical): No  Physical Activity: Sufficiently Active   Days of Exercise per Week: 5 days   Minutes of Exercise per Session: 60 min  Stress: No Stress Concern Present   Feeling of Stress : Not at all  Social Connections: Socially Integrated   Frequency of Communication with Friends and Family: More than three times a week   Frequency of Social Gatherings with Friends and Family: More than three times a week   Attends Religious Services: More than 4 times per year   Active Member of [employer] or Organizations: Yes   Attends Music therapist: More than 4 times per year   Marital Status: Married  Human resources officer Violence: Not At Risk   Fear of Current or Ex-Partner: No   Emotionally Abused: No   Physically Abused: No   Sexually Abused: No     Current Outpatient Medications:    Cholecalciferol (VITAMIN D) 2000 units CAPS, Take 2,000 capsules by mouth daily. , Disp: , Rfl:    COD LIVER OIL PO, Take by mouth., Disp: , Rfl:    Multiple Vitamins-Minerals (WOMENS MULTIVITAMIN PO), Take 1  capsule by mouth daily. , Disp: , Rfl:    vitamin B-12 (CYANOCOBALAMIN) 500 MCG tablet, Take 1,000 mcg by mouth daily., Disp: , Rfl:    alendronate (FOSAMAX) 70 MG tablet, Take 1 tablet (70 mg total) by mouth every 7 (seven) days. Take with a full glass of water on an empty stomach. (Patient not taking: No sig reported), Disp: 12 tablet, Rfl: 3  No Known Allergies   ROS  Constitutional: Negative for fever or weight change.  Respiratory: Negative for cough and shortness of breath.   Cardiovascular: Negative for chest pain or palpitations.  Gastrointestinal: Negative for abdominal pain, no bowel changes.  Musculoskeletal: Negative for gait problem or joint swelling.  Skin: Negative for rash.  Neurological: Negative for dizziness or headache.  No other specific complaints in a complete review of systems (except as listed in HPI above).   Objective  Vitals:   08/02/21 1356  BP: 128/86  Pulse: 71  Resp: 14  Temp: 97.8 F (36.6 C)  TempSrc: Oral  SpO2: 99%  Weight: 159 lb 3.2 oz (72.2 kg)  Height: 5' 2"  (1.575 m)    Body mass index is 29.12 kg/m.  Physical Exam  Constitutional: Patient appears well-developed and well-nourished. No distress.  HENT: Head: Normocephalic and atraumatic. Ears: B TMs ok, no erythema or effusion; Nose: Nose normal. Mouth/Throat: Oropharynx is clear and moist. No oropharyngeal exudate.  Eyes: Conjunctivae and EOM are normal. Pupils are equal, round, and reactive to light. No scleral icterus.  Neck: Normal range of motion. Neck supple. No JVD present. No thyromegaly present.  Cardiovascular: Normal rate, regular rhythm and normal heart sounds.  No murmur heard. No BLE edema. Pulmonary/Chest: Effort normal and breath sounds normal. No respiratory distress. Abdominal: Soft. Bowel sounds are normal, no distension. There is no  tenderness. no masses Breast: no lumps or masses, no nipple discharge or rashes FEMALE GENITALIA:  Not done  RECTAL: not done   Musculoskeletal: Normal range of motion, no joint effusions. No gross deformities Neurological: he is alert and oriented to person, place, and time. No cranial nerve deficit. Coordination, balance, strength, speech and gait are normal.  Skin: Skin is warm and dry. No rash noted. No erythema.  Psychiatric: Patient has a normal mood and affect. behavior is normal. Judgment and thought content normal.   Fall Risk: Fall Risk  08/02/2021 04/27/2021 07/29/2020 04/02/2020 03/29/2019  Falls in the past year? 0 0 1 1 1   Number falls in past yr: - 0 1 0 1  Comment - - - - -  Injury with Fall? - 0 0 0 0  Risk for fall due to : - No Fall Risks History of fall(s) No Fall Risks -  Risk for fall due to: Comment - - - - -  Follow up Falls prevention discussed Falls prevention discussed Education provided Falls prevention discussed -     Functional Status Survey: Is the patient deaf or have difficulty hearing?: No Does the patient have difficulty seeing, even when wearing glasses/contacts?: No Does the patient have difficulty concentrating, remembering, or making decisions?: No Does the patient have difficulty walking or climbing stairs?: No Does the patient have difficulty dressing or bathing?: No Does the patient have difficulty doing errands alone such as visiting a doctor's office or shopping?: No   Assessment & Plan  1. Calcification of abdominal aorta (HCC)  - Lipid panel  2. Osteoporosis without current pathological fracture, unspecified osteoporosis type  - CBC with Differential/Platelet - COMPLETE METABOLIC PANEL WITH GFR - DG Bone Density; Future - alendronate (FOSAMAX) 70 MG tablet; Take 1 tablet (70 mg total) by mouth every 7 (seven) days. Take with a full glass of water on an empty stomach.  Dispense: 12 tablet; Refill: 3  3. Dyslipidemia   4. Primary osteoarthritis of both knees   5. Vitamin D deficiency  - VITAMIN D 25 Hydroxy (Vit-D Deficiency, Fractures)  6.  Vegetarian  - Vitamin B12  7. Carotid artery calcification, bilateral   8. Well adult exam  - CBC with Differential/Platelet - COMPLETE METABOLIC PANEL WITH GFR  9. Encounter for screening mammogram for breast cancer  - MM 3D SCREEN BREAST BILATERAL; Future   -USPSTF grade A and B recommendations reviewed with patient; age-appropriate recommendations, preventive care, screening tests, etc discussed and encouraged; healthy living encouraged; see AVS for patient education given to patient -Discussed importance of 150 minutes of physical activity weekly, eat two servings of fish weekly, eat one serving of tree nuts ( cashews, pistachios, pecans, almonds.Marland Kitchen) every other day, eat 6 servings of fruit/vegetables daily and drink plenty of water and avoid sweet beverages.

## 2021-08-03 LAB — CBC WITH DIFFERENTIAL/PLATELET
Absolute Monocytes: 389 cells/uL (ref 200–950)
Basophils Absolute: 22 cells/uL (ref 0–200)
Basophils Relative: 0.4 %
Eosinophils Absolute: 81 cells/uL (ref 15–500)
Eosinophils Relative: 1.5 %
HCT: 36.8 % (ref 35.0–45.0)
Hemoglobin: 12.1 g/dL (ref 11.7–15.5)
Lymphs Abs: 1674 cells/uL (ref 850–3900)
MCH: 27.4 pg (ref 27.0–33.0)
MCHC: 32.9 g/dL (ref 32.0–36.0)
MCV: 83.4 fL (ref 80.0–100.0)
MPV: 11.4 fL (ref 7.5–12.5)
Monocytes Relative: 7.2 %
Neutro Abs: 3235 cells/uL (ref 1500–7800)
Neutrophils Relative %: 59.9 %
Platelets: 285 10*3/uL (ref 140–400)
RBC: 4.41 10*6/uL (ref 3.80–5.10)
RDW: 13.6 % (ref 11.0–15.0)
Total Lymphocyte: 31 %
WBC: 5.4 10*3/uL (ref 3.8–10.8)

## 2021-08-03 LAB — COMPLETE METABOLIC PANEL WITH GFR
AG Ratio: 1.1 (calc) (ref 1.0–2.5)
ALT: 17 U/L (ref 6–29)
AST: 20 U/L (ref 10–35)
Albumin: 4.1 g/dL (ref 3.6–5.1)
Alkaline phosphatase (APISO): 78 U/L (ref 37–153)
BUN: 9 mg/dL (ref 7–25)
CO2: 26 mmol/L (ref 20–32)
Calcium: 9.6 mg/dL (ref 8.6–10.4)
Chloride: 103 mmol/L (ref 98–110)
Creat: 0.65 mg/dL (ref 0.60–1.00)
Globulin: 3.6 g/dL (calc) (ref 1.9–3.7)
Glucose, Bld: 82 mg/dL (ref 65–99)
Potassium: 3.7 mmol/L (ref 3.5–5.3)
Sodium: 139 mmol/L (ref 135–146)
Total Bilirubin: 0.5 mg/dL (ref 0.2–1.2)
Total Protein: 7.7 g/dL (ref 6.1–8.1)
eGFR: 92 mL/min/{1.73_m2} (ref >=60)

## 2021-08-03 LAB — LIPID PANEL
Cholesterol: 298 mg/dL — ABNORMAL HIGH (ref <200)
HDL: 72 mg/dL (ref >=50)
LDL Cholesterol (Calc): 204 mg/dL (calc) — ABNORMAL HIGH
Non-HDL Cholesterol (Calc): 226 mg/dL (calc) — ABNORMAL HIGH (ref <130)
Total CHOL/HDL Ratio: 4.1 (calc) (ref <5.0)
Triglycerides: 95 mg/dL (ref <150)

## 2021-08-03 LAB — VITAMIN D 25 HYDROXY (VIT D DEFICIENCY, FRACTURES): Vit D, 25-Hydroxy: 58 ng/mL (ref 30–100)

## 2021-08-03 LAB — VITAMIN B12: Vitamin B-12: 591 pg/mL (ref 200–1100)

## 2021-12-13 ENCOUNTER — Ambulatory Visit
Admission: RE | Admit: 2021-12-13 | Discharge: 2021-12-13 | Disposition: A | Discharge status: Home / Self Care | Payer: Medicare HMO | Source: Ambulatory Visit | Attending: Family Medicine | Admitting: Family Medicine

## 2021-12-13 ENCOUNTER — Other Ambulatory Visit: Payer: Self-pay

## 2021-12-13 DIAGNOSIS — M810 Age-related osteoporosis without current pathological fracture: Secondary | ICD-10-CM | POA: Insufficient documentation

## 2021-12-13 DIAGNOSIS — Z1231 Encounter for screening mammogram for malignant neoplasm of breast: Secondary | ICD-10-CM

## 2021-12-13 DIAGNOSIS — R928 Other abnormal and inconclusive findings on diagnostic imaging of breast: Secondary | ICD-10-CM

## 2021-12-14 ENCOUNTER — Other Ambulatory Visit: Payer: Self-pay | Admitting: Family Medicine

## 2021-12-14 DIAGNOSIS — R928 Other abnormal and inconclusive findings on diagnostic imaging of breast: Secondary | ICD-10-CM

## 2021-12-14 DIAGNOSIS — N6489 Other specified disorders of breast: Secondary | ICD-10-CM

## 2022-01-05 ENCOUNTER — Ambulatory Visit
Admission: RE | Admit: 2022-01-05 | Discharge: 2022-01-05 | Disposition: A | Discharge status: Home / Self Care | Payer: Medicare HMO | Source: Ambulatory Visit | Attending: Family Medicine | Admitting: Family Medicine

## 2022-01-05 DIAGNOSIS — N6489 Other specified disorders of breast: Secondary | ICD-10-CM

## 2022-01-05 DIAGNOSIS — R928 Other abnormal and inconclusive findings on diagnostic imaging of breast: Secondary | ICD-10-CM

## 2022-01-18 ENCOUNTER — Encounter: Payer: Self-pay | Admitting: Family Medicine

## 2022-01-21 ENCOUNTER — Ambulatory Visit: Payer: PRIVATE HEALTH INSURANCE | Admitting: Family Medicine

## 2022-02-01 ENCOUNTER — Ambulatory Visit (INDEPENDENT_AMBULATORY_CARE_PROVIDER_SITE_OTHER): Payer: Medicare HMO | Admitting: Family Medicine

## 2022-02-01 ENCOUNTER — Encounter: Payer: Self-pay | Admitting: Family Medicine

## 2022-02-01 VITALS — BP 118/76 | HR 66 | Temp 97.9°F | Resp 16 | Ht 62.0 in | Wt 158.6 lb

## 2022-02-01 DIAGNOSIS — E785 Hyperlipidemia, unspecified: Secondary | ICD-10-CM

## 2022-02-01 DIAGNOSIS — Z789 Other specified health status: Secondary | ICD-10-CM

## 2022-02-01 DIAGNOSIS — M170 Bilateral primary osteoarthritis of knee: Secondary | ICD-10-CM | POA: Diagnosis not present

## 2022-02-01 DIAGNOSIS — M810 Age-related osteoporosis without current pathological fracture: Secondary | ICD-10-CM

## 2022-02-01 DIAGNOSIS — I700 Atherosclerosis of aorta: Secondary | ICD-10-CM

## 2022-02-01 DIAGNOSIS — I6523 Occlusion and stenosis of bilateral carotid arteries: Secondary | ICD-10-CM

## 2022-02-01 DIAGNOSIS — Z23 Encounter for immunization: Secondary | ICD-10-CM

## 2022-02-01 DIAGNOSIS — E559 Vitamin D deficiency, unspecified: Secondary | ICD-10-CM

## 2022-02-01 DIAGNOSIS — I739 Peripheral vascular disease, unspecified: Secondary | ICD-10-CM

## 2022-02-01 DIAGNOSIS — Z1211 Encounter for screening for malignant neoplasm of colon: Secondary | ICD-10-CM

## 2022-02-01 NOTE — Progress Notes (Signed)
Name: Paula Mills   MRN: [id number]    DOB: 1946/01/16   Date:02/01/2022 ? ?     Progress Note ? ?Subjective ? ?Chief Complaint ? ?Chief Complaint  ?Patient presents with  ? Follow-up  ? ? ?HPI ? ?Atherosclerosis or Aorta/carotid atherosclerosis/Hyperlipidemia: she states dyslipidemia occurs in her family but mother was 73 and died from aneurysm of brain. Father died at 46 from complications of MS. She refuses to take medication. Advised to consider at least aspirin 81 mg  ?  ?OA: seen by ortho, going to the gym, no longer has pain , she was recently in [place] and did a lot of walking without any pain  ?  ?Osteoporosis: explained the results from last year, getting worse, she is Alendronate but not taking every week. We discussed importance of taking it daily but after she read possible side effects  she decided to stop taking it all together . Discussed importance of high calcium diet , vitamin D supplementation and consider seeing Endo for other options - such as Reclast, Forteo or Prolia . Also discussed Evista, she would like to research first and let me know what she would like to do  ? ?Reviewed last bone density done 03/23 that showed progression of osteoporosis  ? ?PAD: she had a screen ABI that was abnormal, denies claudication and not interested in statin therapy  ? ?Patient Active Problem List  ? Diagnosis Date Noted  ? Screen for colon cancer   ? Benign neoplasm of ascending colon   ? Polyp of sigmoid colon   ? Carotid artery calcification, bilateral 10/18/2016  ? At risk for falling 04/08/2016  ? H/O: HTN (hypertension) 04/08/2016  ? Hypo-ovarianism 04/08/2016  ? Asymptomatic varicose veins 04/08/2016  ? Avitaminosis D 04/08/2016  ? Hiatal hernia 02/09/2016  ? Liver cyst 02/09/2016  ? Lumbar spondylosis 02/09/2016  ? Osteoporosis 02/09/2016  ? Dyslipidemia 02/09/2016  ? Vegetarian 02/09/2016  ? Vitamin D deficiency 02/09/2016  ? Calcification of abdominal aorta (HCC) 02/09/2016  ? History of small bowel  obstruction 03/12/2015  ? ? ?Past Surgical History:  ?Procedure Laterality Date  ? ABDOMINAL HYSTERECTOMY    ? APPENDECTOMY N/A 03/14/2015  ? Procedure: APPENDECTOMY;  Surgeon: Molly Maduro, MD;  Location: [facility] ORS;  Service: General;  Laterality: N/A;  ? CESAREAN SECTION    ? COLONOSCOPY WITH PROPOFOL N/A 01/31/2017  ? Procedure: COLONOSCOPY WITH PROPOFOL;  Surgeon: Lucilla Lame, MD;  Location: [facility] ENDOSCOPY;  Service: Endoscopy;  Laterality: N/A;  ? LAPAROTOMY N/A 03/14/2015  ? Procedure: EXPLORATORY LAPAROTOMY;  Surgeon: Molly Maduro, MD;  Location: [facility] ORS;  Service: General;  Laterality: N/A;  ? ? ?Family History  ?Problem Relation Age of Onset  ? Hyperlipidemia Mother   ? Hypertension Mother   ? Dementia Mother   ? Multiple sclerosis Father   ? Hypertension Father   ? Hypertension Sister   ? Hyperlipidemia Sister   ? Multiple sclerosis Sister   ? ? ?Social History  ? ?Tobacco Use  ? Smoking status: Never  ? Smokeless tobacco: Never  ?Substance Use Topics  ? Alcohol use: Not Currently  ?  Alcohol/week: 0.0 standard drinks  ? ? ? ?Current Outpatient Medications:  ?  alendronate (FOSAMAX) 70 MG tablet, Take 1 tablet (70 mg total) by mouth every 7 (seven) days. Take with a full glass of water on an empty stomach., Disp: 12 tablet, Rfl: 3 ?  Cholecalciferol (VITAMIN D) 2000 units CAPS, Take 2,000 capsules by mouth daily. ,  Disp: , Rfl:  ?  COD LIVER OIL PO, Take by mouth., Disp: , Rfl:  ?  Multiple Vitamins-Minerals (WOMENS MULTIVITAMIN PO), Take 1 capsule by mouth daily. , Disp: , Rfl:  ?  vitamin B-12 (CYANOCOBALAMIN) 500 MCG tablet, Take 1,000 mcg by mouth daily., Disp: , Rfl:  ? ?No Known Allergies ? ?I personally reviewed active problem list, medication list, allergies, family history, social history with the patient/caregiver today. ? ? ?ROS ? ?Constitutional: Negative for fever or weight change.  ?Respiratory: Negative for cough and shortness of breath.   ?Cardiovascular: Negative for chest pain or  palpitations.  ?Gastrointestinal: Negative for abdominal pain, no bowel changes.  ?Musculoskeletal: Negative for gait problem or joint swelling.  ?Skin: Negative for rash.  ?Neurological: Negative for dizziness or headache.  ?No other specific complaints in a complete review of systems (except as listed in HPI above).  ? ?Objective ? ?Vitals:  ? 02/01/22 1434  ?BP: 118/76  ?Pulse: 66  ?Resp: 16  ?Temp: 97.9 ?F (36.6 ?C)  ?TempSrc: Oral  ?SpO2: 97%  ?Weight: 158 lb 9.6 oz (71.9 kg)  ?Height: '5\' 2"'$  (1.575 m)  ? ? ?Body mass index is 29.01 kg/m?. ? ?Physical Exam ? ?Constitutional: Patient appears well-developed and well-nourished. Obese  No distress.  ?HEENT: head atraumatic, normocephalic, pupils equal and reactive to light, neck supple ?Cardiovascular: Normal rate, regular rhythm and normal heart sounds.  No murmur heard. No BLE edema. ?Pulmonary/Chest: Effort normal and breath sounds normal. No respiratory distress. ?Abdominal: Soft.  There is no tenderness. ?Psychiatric: Patient has a normal mood and affect. behavior is normal. Judgment and thought content normal.  ? ?PHQ2/9: ? ?  02/01/2022  ?  2:35 PM 08/02/2021  ?  2:08 PM 04/27/2021  ?  8:44 AM 07/29/2020  ?  1:34 PM 07/29/2020  ?  1:22 PM  ?Depression screen PHQ 2/9  ?Decreased Interest 0 0 0 0 0  ?Down, Depressed, Hopeless 0 0 0 0 0  ?PHQ - 2 Score 0 0 0 0 0  ?Altered sleeping  0  0 0  ?Tired, decreased energy  0  0 0  ?Change in appetite  0  0 0  ?Feeling bad or failure about yourself   0  0 0  ?Trouble concentrating  0  0 0  ?Moving slowly or fidgety/restless  0  0 0  ?Suicidal thoughts  0  0 0  ?PHQ-9 Score  0  0 0  ?Difficult doing work/chores    Not difficult at all   ?  ?phq 9 is negative ? ? ?Fall Risk: ? ?  02/01/2022  ?  2:35 PM 08/02/2021  ?  2:07 PM 04/27/2021  ?  8:46 AM 07/29/2020  ?  1:22 PM 04/02/2020  ?  1:56 PM  ?Fall Risk   ?Falls in the past year? 0 0 0 1 1  ?Number falls in past yr:   0 1 0  ?Injury with Fall?   0 0 0  ?Risk for fall due to :    No Fall Risks History of fall(s) No Fall Risks  ?Follow up Falls prevention discussed Falls prevention discussed Falls prevention discussed Education provided Falls prevention discussed  ? ? ? ?Functional Status Survey: ?Is the patient deaf or have difficulty hearing?: No ?Does the patient have difficulty seeing, even when wearing glasses/contacts?: No ?Does the patient have difficulty concentrating, remembering, or making decisions?: No ?Does the patient have difficulty walking or climbing stairs?: No ?Does the patient  have difficulty dressing or bathing?: No ?Does the patient have difficulty doing errands alone such as visiting a doctor's office or shopping?: No ? ? ? ?Assessment & Plan ? ? ?1. Dyslipidemia ? ? ?2. Vegetarian ? ? ?3. Primary osteoarthritis of both knees ? ? ?4. Calcification of abdominal aorta (HCC) ? ? ?5. Osteoporosis without current pathological fracture, unspecified osteoporosis type ? ? ?6. Colon cancer screening ? ?- Ambulatory referral to Gastroenterology ? ?7. Carotid artery calcification, bilateral ? ? ?8. Vitamin D deficiency ? ? ?9. PAD (peripheral artery disease) ([place]) ? ? ?10. Need for Streptococcus pneumoniae vaccination ? ?- Pneumococcal conjugate vaccine 20-valent (Prevnar 20)  ?

## 2022-02-01 NOTE — Patient Instructions (Addendum)
Forteo ?Prolia ?Reclast  ?Evista  ?

## 2022-02-03 ENCOUNTER — Telehealth: Payer: Self-pay

## 2022-02-03 ENCOUNTER — Other Ambulatory Visit: Payer: Self-pay

## 2022-02-03 DIAGNOSIS — Z86010 Personal history of colonic polyps: Secondary | ICD-10-CM

## 2022-02-03 MED ORDER — NA SULFATE-K SULFATE-MG SULF 17.5-3.13-1.6 GM/177ML PO SOLN: 1.0000 | Freq: Once | ORAL | 0 refills | Status: AC

## 2022-02-03 NOTE — Telephone Encounter (Signed)
Gastroenterology Pre-Procedure Review ? ?Request Date: 03/08/22 ?Requesting Physician: Dr. Allen Norris ? ?PATIENT REVIEW QUESTIONS: The patient responded to the following health history questions as indicated:   ? ?1. Are you having any GI issues? no ?2. Do you have a personal history of Polyps? yes (01/31/17 colonoscopy performed  by Dr. Allen Norris) ?3. Do you have a family history of Colon Cancer or Polyps? no ?4. Diabetes Mellitus? no ?5. Joint replacements in the past 12 months?no ?6. Major health problems in the past 3 months?no ?7. Any artificial heart valves, MVP, or defibrillator?no ?   ?MEDICATIONS & ALLERGIES:    ?Patient reports the following regarding taking any anticoagulation/antiplatelet therapy:   ?Plavix, Coumadin, Eliquis, Xarelto, Lovenox, Pradaxa, Brilinta, or Effient? no ?Aspirin? no ? ?Patient confirms/reports the following medications:  ?Current Outpatient Medications  ?Medication Sig Dispense Refill  ? Cholecalciferol (VITAMIN D) 2000 units CAPS Take 2,000 capsules by mouth daily.     ? COD LIVER OIL PO Take by mouth.    ? Multiple Vitamins-Minerals (WOMENS MULTIVITAMIN PO) Take 1 capsule by mouth daily.     ? vitamin B-12 (CYANOCOBALAMIN) 500 MCG tablet Take 1,000 mcg by mouth daily.    ? ?No current facility-administered medications for this visit.  ? ? ?Patient confirms/reports the following allergies:  ?No Known Allergies ? ?No orders of the defined types were placed in this encounter. ? ? ?AUTHORIZATION INFORMATION ?Primary Insurance: ?1D#: ?Group #: ? ?Secondary Insurance: ?1D#: ?Group #: ? ?SCHEDULE INFORMATION: ?Date: 03/08/22 ?Time: ?Location: [facility] ?

## 2022-02-03 NOTE — Telephone Encounter (Signed)
Colonoscopy scheduled with Dr. Allen Norris 03/08/22 at [facility]. ?

## 2022-02-18 ENCOUNTER — Telehealth: Payer: Self-pay | Admitting: Gastroenterology

## 2022-02-18 NOTE — Telephone Encounter (Signed)
Pt left message to change date for procedure would like a call back

## 2022-02-21 ENCOUNTER — Telehealth: Payer: Self-pay

## 2022-02-21 NOTE — Telephone Encounter (Signed)
Patients call has been returned for her reschedule colonoscopy with Dr. Allen Norris. She has been rescheduled to 03/29/22, however if something becomes available sooner she would like to be notified.   Thanks, [place], [place]

## 2022-03-28 ENCOUNTER — Telehealth: Payer: Self-pay

## 2022-03-28 ENCOUNTER — Encounter: Payer: Self-pay | Admitting: Gastroenterology

## 2022-03-28 ENCOUNTER — Ambulatory Visit: Payer: Self-pay

## 2022-03-28 NOTE — Telephone Encounter (Signed)
Marcelino Duster, CMA reached out via secure message and has spoken with pt. No further assistance needed from NT.

## 2022-03-28 NOTE — Telephone Encounter (Signed)
Pt called on both numbers in chart, [phone] VM not set up and kept ringing. Reached out to [place], [place] with [facility] on instructions for bowel prep. She said she would reach out to pt to advise her since pt had already been given instructions previously.   Summary: discuss medication   Patient inquiring what time she should take contrast for colonoscopy   Patient states she was advised by [facility] to all and ask provider   Please fu w/ patient

## 2022-04-28 ENCOUNTER — Ambulatory Visit (INDEPENDENT_AMBULATORY_CARE_PROVIDER_SITE_OTHER): Payer: PRIVATE HEALTH INSURANCE

## 2022-04-28 VITALS — Ht 62.5 in | Wt 158.0 lb

## 2022-04-28 DIAGNOSIS — Z0000 Encounter for general adult medical examination without abnormal findings: Secondary | ICD-10-CM | POA: Diagnosis not present

## 2022-04-28 NOTE — Progress Notes (Addendum)
Annual Wellness Visit  I connected with  Paula Mills on 06/13/22 by a video enabled telemedicine application and verified that I am speaking with the correct person using two identifiers.   I discussed the limitations of evaluation and management by telemedicine. The patient expressed understanding and agreed to proceed.     Patient: Paula Mills, Female    DOB: 1946-08-28, 76 y.o.   MRN: [id number]   Paula Mills is a 76 y.o. female who presents today for her Annual Wellness Visit. She reports consuming a  vegetarian  diet. Home exercise routine includes walking 1-2 hrs per week. She generally feels well. She reports sleeping well. She does not have additional problems to discuss today.   HPI  Vision:Within last year   Patient Active Problem List   Diagnosis Date Noted   Screen for colon cancer    Benign neoplasm of ascending colon    Polyp of sigmoid colon    Carotid artery calcification, bilateral 10/18/2016   At risk for falling 04/08/2016   H/O: HTN (hypertension) 04/08/2016   Hypo-ovarianism 04/08/2016   Asymptomatic varicose veins 04/08/2016   Avitaminosis D 04/08/2016   Hiatal hernia 02/09/2016   Liver cyst 02/09/2016   Lumbar spondylosis 02/09/2016   Osteoporosis 02/09/2016   Dyslipidemia 02/09/2016   Vegetarian 02/09/2016   Vitamin D deficiency 02/09/2016   Calcification of abdominal aorta ([place]) 02/09/2016   History of small bowel obstruction 03/12/2015      Medications: Outpatient Medications Prior to Visit  Medication Sig   Cholecalciferol (VITAMIN D) 2000 units CAPS Take 2,000 capsules by mouth daily.    COD LIVER OIL PO Take by mouth.   Multiple Vitamins-Minerals (WOMENS MULTIVITAMIN PO) Take 1 capsule by mouth daily.    vitamin B-12 (CYANOCOBALAMIN) 500 MCG tablet Take 1,000 mcg by mouth daily.   No facility-administered medications prior to visit.    No Known Allergies  Patient Care Team: Steele Sizer, MD as PCP - General (Family  Medicine)  ROS      Objective  There were no vitals taken for this visit. BP Readings from Last 3 Encounters:  02/01/22 118/76  08/02/21 128/86  04/27/21 132/82      Physical Exam    Most recent functional status assessment:    02/01/2022    2:36 PM  In your present state of health, do you have any difficulty performing the following activities:  Hearing? 0  Vision? 0  Difficulty concentrating or making decisions? 0  Walking or climbing stairs? 0  Dressing or bathing? 0  Doing errands, shopping? 0   Most recent fall risk assessment:    02/01/2022    2:35 PM  [place] in the past year? 0  Follow up Falls prevention discussed    Most recent depression screenings:    02/01/2022    2:35 PM 08/02/2021    2:08 PM  PHQ 2/9 Scores  PHQ - 2 Score 0 0  PHQ- 9 Score  0   Most recent cognitive screening:    03/28/2019    1:26 PM  6CIT Screen  What Year? 0 points  What month? 0 points  What time? 0 points  Count back from 20 0 points  Months in reverse 0 points  Repeat phrase 0 points  Total Score 0 points   Most recent Audit-C alcohol use screening    02/01/2022    2:35 PM  Alcohol Use Disorder Test (AUDIT)  1. How often do  you have a drink containing alcohol? 0   A score of 3 or more in women, and 4 or more in men indicates increased risk for alcohol abuse, EXCEPT if all of the points are from question 1   Vision/Hearing Screen: No results found.  Last CBC Lab Results  Component Value Date   WBC 5.4 08/02/2021   HGB 12.1 08/02/2021   HCT 36.8 08/02/2021   MCV 83.4 08/02/2021   MCH 27.4 08/02/2021   RDW 13.6 08/02/2021   PLT 285 08/02/2021      No results found for any visits on 04/28/22.    Assessment & Plan   Annual wellness visit done today including the all of the following: Reviewed patient's Family Medical History Reviewed and updated list of patient's medical providers Assessment of cognitive impairment was done Assessed  patient's functional ability Established a written schedule for health screening [place] Completed and Reviewed  Exercise Activities and Dietary recommendations  Goals      DIET - INCREASE WATER INTAKE     Recommend to drink at least 6-8 8oz glasses of water per day.      Weight (lb) < 150 lb (68 kg)     Pt would like to lose 10 pounds over the next year with healthy eating and physical activity         Immunization History  Administered Date(s) Administered   Influenza, Seasonal, Injecte, Preservative Fre 10/01/2013   Moderna Sars-Covid-2 Vaccination 10/28/2019, 11/25/2019   PFIZER(Purple Top)SARS-COV-2 Vaccination 07/28/2020   PNEUMOCOCCAL CONJUGATE-20 02/01/2022    Health Maintenance  Topic Date Due   COVID-19 Vaccine (4 - Moderna series) 09/22/2020   COLONOSCOPY (Pts 45-3yr Insurance coverage will need to be confirmed)  01/31/2022   Zoster Vaccines- Shingrix (1 of 2) 05/04/2022 (Originally 08/21/1995)   TETANUS/TDAP  10/03/2026 (Originally 10/03/2016)   INFLUENZA VACCINE  05/03/2022   MAMMOGRAM  12/14/2022   Pneumonia Vaccine 76 Years old  Completed   DEXA SCAN  Completed   Hepatitis C Screening  Completed   HPV VACCINES  Aged Out     Discussed health benefits of physical activity, and encouraged her to engage in regular exercise appropriate for her age and condition.    No questions or concerns to express at this time.   Sees Dr. SAncil Boozer11/13/2023 for CPE.    SRoyal Hawthorn CMA  I have reviewed this encounter including the documentation in this note and/or discussed this patient with the provider, SRexford Maus CMA. I am certifying that I agree with the content of this note as supervising physician.  KSteele Sizer MD [employer] 06/13/2022, 4:35 PM

## 2022-05-24 ENCOUNTER — Other Ambulatory Visit: Payer: Self-pay

## 2022-05-24 ENCOUNTER — Encounter: Payer: Self-pay | Admitting: Gastroenterology

## 2022-05-24 ENCOUNTER — Ambulatory Visit: Payer: Medicare HMO | Admitting: Anesthesiology

## 2022-05-24 ENCOUNTER — Ambulatory Visit
Admission: RE | Admit: 2022-05-24 | Discharge: 2022-05-24 | Disposition: A | Discharge status: Home / Self Care | Payer: Medicare HMO | Attending: Gastroenterology | Admitting: Gastroenterology

## 2022-05-24 ENCOUNTER — Encounter
Admission: RE | Disposition: A | Discharge status: Home / Self Care | Payer: Self-pay | Source: Home / Self Care | Attending: Gastroenterology

## 2022-05-24 DIAGNOSIS — Z860100 Personal history of colon polyps, unspecified: Secondary | ICD-10-CM

## 2022-05-24 DIAGNOSIS — Z1211 Encounter for screening for malignant neoplasm of colon: Secondary | ICD-10-CM | POA: Insufficient documentation

## 2022-05-24 DIAGNOSIS — K640 First degree hemorrhoids: Secondary | ICD-10-CM | POA: Insufficient documentation

## 2022-05-24 HISTORY — PX: COLONOSCOPY WITH PROPOFOL: SHX5780

## 2022-05-24 SURGERY — COLONOSCOPY WITH PROPOFOL
Anesthesia: General

## 2022-05-24 MED ORDER — SODIUM CHLORIDE 0.9 % IV SOLN: INTRAVENOUS | Status: DC

## 2022-05-24 MED ORDER — PROPOFOL 10 MG/ML IV BOLUS
INTRAVENOUS | Status: DC | PRN
  Administered 2022-05-24: 40 mg via INTRAVENOUS
  Administered 2022-05-24: 100 mg via INTRAVENOUS
  Administered 2022-05-24 (×2): 40 mg via INTRAVENOUS

## 2022-05-24 NOTE — Anesthesia Preprocedure Evaluation (Addendum)
Anesthesia Evaluation  Patient identified by MRN, date of birth, ID band Patient awake    Reviewed: Allergy & Precautions, NPO status , Patient's Chart, lab work & pertinent test results  History of Anesthesia Complications Negative for: history of anesthetic complications  Airway Mallampati: I   Neck ROM: Full    Dental no notable dental hx.    Pulmonary neg pulmonary ROS,    Pulmonary exam normal breath sounds clear to auscultation       Cardiovascular + Peripheral Vascular Disease  Normal cardiovascular exam Rhythm:Regular Rate:Normal     Neuro/Psych Vertigo     GI/Hepatic hiatal hernia,   Endo/Other  negative endocrine ROS  Renal/GU negative Renal ROS     Musculoskeletal  (+) Arthritis ,   Abdominal   Peds  Hematology negative hematology ROS (+)   Anesthesia Other Findings   Reproductive/Obstetrics                            Anesthesia Physical Anesthesia Plan  ASA: 2  Anesthesia Plan: General   Post-op Pain Management:    Induction: Intravenous  PONV Risk Score and Plan: 3 and Propofol infusion, TIVA and Treatment may vary due to age or medical condition  Airway Management Planned: Natural Airway  Additional Equipment:   Intra-op Plan:   Post-operative Plan:   Informed Consent: I have reviewed the patients History and Physical, chart, labs and discussed the procedure including the risks, benefits and alternatives for the proposed anesthesia with the patient or authorized representative who has indicated his/her understanding and acceptance.       Plan Discussed with: CRNA  Anesthesia Plan Comments: (LMA/GETA backup discussed.  Patient consented for risks of anesthesia including but not limited to:  - adverse reactions to medications - damage to eyes, teeth, lips or other oral mucosa - nerve damage due to positioning  - sore throat or hoarseness - damage to  heart, brain, nerves, lungs, other parts of body or loss of life  Informed patient about role of CRNA in peri- and intra-operative care.  Patient voiced understanding.)        Anesthesia Quick Evaluation

## 2022-05-24 NOTE — Op Note (Signed)
[facility] Patient Name: Paula Mills Procedure Date: 05/24/2022 9:22 AM MRN: [id number] Account #: [id number] Date of Birth: 1946-04-01 Admit Type: Outpatient Age: 76 Room: [facility] ENDO ROOM 4 Gender: Female Note Status: Finalized Instrument Name: Jasper Riling [id number] Procedure:             Colonoscopy Indications:           High risk colon cancer surveillance: Personal history                         of colonic polyps Providers:             Lucilla Lame MD, MD Referring MD:          Bethena Roys. Sowles, MD (Referring MD) Medicines:             Propofol per Anesthesia Complications:         No immediate complications. Procedure:             Pre-Anesthesia Assessment:                        - Prior to the procedure, a History and Physical was                         performed, and patient medications and allergies were                         reviewed. The patient's tolerance of previous                         anesthesia was also reviewed. The risks and benefits                         of the procedure and the sedation options and risks                         were discussed with the patient. All questions were                         answered, and informed consent was obtained. Prior                         Anticoagulants: The patient has taken no previous                         anticoagulant or antiplatelet agents. ASA Grade                         Assessment: II - A patient with mild systemic disease.                         After reviewing the risks and benefits, the patient                         was deemed in satisfactory condition to undergo the                         procedure.  After obtaining informed consent, the colonoscope was                         passed under direct vision. Throughout the procedure,                         the patient's blood pressure, pulse, and oxygen                         saturations  were monitored continuously. The                         Colonoscope was introduced through the anus and                         advanced to the the cecum, identified by appendiceal                         orifice and ileocecal valve. The colonoscopy was                         performed without difficulty. The patient tolerated                         the procedure well. The quality of the bowel                         preparation was excellent. Findings:      The perianal and digital rectal examinations were normal.      Non-bleeding internal hemorrhoids were found during retroflexion. The       hemorrhoids were Grade I (internal hemorrhoids that do not prolapse).      The exam was otherwise without abnormality. Impression:            - Non-bleeding internal hemorrhoids.                        - The examination was otherwise normal.                        - No specimens collected. Recommendation:        - Discharge patient to home.                        - Resume previous diet.                        - Continue present medications.                        - Repeat colonoscopy is not recommended for                         surveillance. Procedure Code(s):     --- Professional ---                        [phone], Colonoscopy, flexible; diagnostic, including                         collection of specimen(s) by brushing or washing, when  performed (separate procedure) Diagnosis Code(s):     --- Professional ---                        Z86.010, Personal history of colonic polyps CPT copyright 2019 American Medical Association. All rights reserved. The codes documented in this report are preliminary and upon coder review may  be revised to meet current compliance requirements. Lucilla Lame MD, MD 05/24/2022 9:43:32 AM This report has been signed electronically. Number of Addenda: 0 Note Initiated On: 05/24/2022 9:22 AM Scope Withdrawal Time: 0 hours 6 minutes 35 seconds   Total Procedure Duration: 0 hours 10 minutes 37 seconds  Estimated Blood Loss:  Estimated blood loss: none.      [facility]

## 2022-05-24 NOTE — Anesthesia Postprocedure Evaluation (Signed)
Anesthesia Post Note  Patient: Paula Mills  Procedure(s) Performed: COLONOSCOPY WITH PROPOFOL  Patient location during evaluation: PACU Anesthesia Type: General Level of consciousness: awake and alert, oriented and patient cooperative Pain management: pain level controlled Vital Signs Assessment: post-procedure vital signs reviewed and stable Respiratory status: spontaneous breathing, nonlabored ventilation and respiratory function stable Cardiovascular status: blood pressure returned to baseline and stable Postop Assessment: adequate PO intake Anesthetic complications: no   No notable events documented.   Last Vitals:  Vitals:   05/24/22 1000 05/24/22 1007  BP:  (!) 157/77  Pulse: (!) 57 60  Resp: 16 16  Temp:    SpO2: 97% 100%    Last Pain:  Vitals:   05/24/22 0947  TempSrc: Temporal  PainSc:                  Darrin Nipper

## 2022-05-24 NOTE — H&P (Signed)
Lucilla Lame, MD [address] [place], [place] [address] Phone:[phone] Fax : [phone]  Primary Care Physician:  Steele Sizer, MD Primary Gastroenterologist:  Dr. Allen Norris  Pre-Procedure History & Physical: HPI:  Paula Mills is a 76 y.o. female is here for an colonoscopy.   Past Medical History:  Diagnosis Date   Hyperlipidemia    Vertigo     Past Surgical History:  Procedure Laterality Date   ABDOMINAL HYSTERECTOMY     APPENDECTOMY N/A 03/14/2015   Procedure: APPENDECTOMY;  Surgeon: Molly Maduro, MD;  Location: [facility] ORS;  Service: General;  Laterality: N/A;   CESAREAN SECTION     COLONOSCOPY WITH PROPOFOL N/A 01/31/2017   Procedure: COLONOSCOPY WITH PROPOFOL;  Surgeon: Lucilla Lame, MD;  Location: [facility] ENDOSCOPY;  Service: Endoscopy;  Laterality: N/A;   LAPAROTOMY N/A 03/14/2015   Procedure: EXPLORATORY LAPAROTOMY;  Surgeon: Molly Maduro, MD;  Location: [facility] ORS;  Service: General;  Laterality: N/A;    Prior to Admission medications   Medication Sig Start Date End Date Taking? Authorizing Provider  Cholecalciferol (VITAMIN D) 2000 units CAPS Take 2,000 capsules by mouth daily.    Yes [provider]  COD LIVER OIL PO Take by mouth.   Yes [provider]  Multiple Vitamins-Minerals (WOMENS MULTIVITAMIN PO) Take 1 capsule by mouth daily.    Yes [provider]  vitamin B-12 (CYANOCOBALAMIN) 500 MCG tablet Take 1,000 mcg by mouth daily.   Yes [provider]    Allergies as of 02/03/2022   (No Known Allergies)    Family History  Problem Relation Age of Onset   Hyperlipidemia Mother    Hypertension Mother    Dementia Mother    Multiple sclerosis Father    Hypertension Father    Hypertension Sister    Hyperlipidemia Sister    Multiple sclerosis Sister     Social History   Socioeconomic History   Marital status: Married    Spouse name: Winferd Humphrey   Number of children: 2   Years of education: Not on  file   Highest education level: Master's degree (e.g., MA, MS, MEng, MEd, MSW, MBA)  Occupational History   Occupation: Retired  Tobacco Use   Smoking status: Never   Smokeless tobacco: Never  Vaping Use   Vaping Use: Never used  Substance and Sexual Activity   Alcohol use: Not Currently    Alcohol/week: 0.0 standard drinks of alcohol   Drug use: No   Sexual activity: Not Currently    Partners: Male  Other Topics Concern   Not on file  Social History Narrative   Husband has a pacemaker    Social Determinants of Health   Financial Resource Strain: Low Risk  (04/28/2022)   Overall Financial Resource Strain (CARDIA)    Difficulty of Paying Living Expenses: Not hard at all  Food Insecurity: No Food Insecurity (04/28/2022)   Hunger Vital Sign    Worried About Running Out of Food in the Last Year: Never true    [place] in the Last Year: Never true  Transportation Needs: No Transportation Needs (04/28/2022)   PRAPARE - Hydrologist (Medical): No    Lack of Transportation (Non-Medical): No  Physical Activity: Sufficiently Active (04/28/2022)   Exercise Vital Sign    Days of Exercise per Week: 5 days    Minutes of Exercise per Session: 40 min  Stress: No Stress Concern Present (04/28/2022)   [employer] Occupational  Health - Occupational Stress Questionnaire    Feeling of Stress : Only a little  Social Connections: Moderately Integrated (04/28/2022)   Social Connection and Isolation Panel [NHANES]    Frequency of Communication with Friends and Family: More than three times a week    Frequency of Social Gatherings with Friends and Family: More than three times a week    Attends Religious Services: More than 4 times per year    Active Member of [employer] or Organizations: No    Attends Archivist Meetings: Never    Marital Status: Married  Human resources officer Violence: Not At Risk (04/28/2022)   Humiliation, Afraid, Rape, and Kick  questionnaire    Fear of Current or Ex-Partner: No    Emotionally Abused: No    Physically Abused: No    Sexually Abused: No    Review of Systems: See HPI, otherwise negative ROS  Physical Exam: BP (!) 156/83   Pulse 62   Temp (!) 96.8 F (36 C) (Temporal)   Resp 18   Ht 5' 2.5" (1.588 m)   Wt 72.1 kg   SpO2 98%   BMI 28.62 kg/m  General:   Alert,  pleasant and cooperative in NAD Head:  Normocephalic and atraumatic. Neck:  Supple; no masses or thyromegaly. Lungs:  Clear throughout to auscultation.    Heart:  Regular rate and rhythm. Abdomen:  Soft, nontender and nondistended. Normal bowel sounds, without guarding, and without rebound.   Neurologic:  Alert and  oriented x4;  grossly normal neurologically.  Impression/Plan: [name] is here for an colonoscopy to be performed for a history of adenomatous polyps on 2018   Risks, benefits, limitations, and alternatives regarding  colonoscopy have been reviewed with the patient.  Questions have been answered.  All parties agreeable.   Lucilla Lame, MD  05/24/2022, 9:22 AM

## 2022-05-24 NOTE — Transfer of Care (Signed)
Immediate Anesthesia Transfer of Care Note  Patient: Paula Mills  Procedure(s) Performed: COLONOSCOPY WITH PROPOFOL  Patient Location: Endoscopy Unit  Anesthesia Type:General  Level of Consciousness: drowsy  Airway & Oxygen Therapy: Patient Spontanous Breathing and Patient connected to nasal cannula oxygen  Post-op Assessment: Report given to RN and Post -op Vital signs reviewed and stable  Post vital signs: Reviewed and stable  Last Vitals:  Vitals Value Taken Time  BP 125/68 05/24/22 0946  Temp    Pulse 59 05/24/22 0946  Resp 19 05/24/22 0946  SpO2 100 % 05/24/22 0946  Vitals shown include unvalidated device data.  Last Pain:  Vitals:   05/24/22 0832  TempSrc: Temporal  PainSc: 0-No pain         Complications: No notable events documented.

## 2022-05-25 ENCOUNTER — Encounter: Payer: Self-pay | Admitting: Gastroenterology

## 2022-05-30 ENCOUNTER — Ambulatory Visit: Payer: Self-pay | Admitting: *Deleted

## 2022-05-30 ENCOUNTER — Telehealth: Payer: Self-pay

## 2022-05-30 NOTE — Telephone Encounter (Signed)
Dr Allen Norris pt  Pt called to report low back pain since last Wed... intermittent sharp pains with muscle spasms.... Pt denies any other Sx or concerns...  Pt has also reached out to PCP... Pt denies Hx of low back pain, however, her chart states lumbar pain since 2018 and xray from 2019 reports   Mild disc space narrowing at L5-S1. No acute bony findings or destructive bony changes.   IMPRESSION: No acute bony findings. Mild degenerative changes. Moderate facet disease in the lower lumbar spine.  Pt has tried Tylenol  Please advise

## 2022-05-30 NOTE — Telephone Encounter (Signed)
Pt is aware and has already reached out to PCP and may be able to be seen Wed... Pt expressed understanding

## 2022-05-30 NOTE — Telephone Encounter (Signed)
Reason for Disposition  Age > 60 years    Back pain started after colonoscopy on 05/24/2022  [1] MODERATE back pain (e.g., interferes with normal activities) AND [2] present > 3 days  Answer Assessment - Initial Assessment Questions 1. LOCATION: "Where does it hurt?"      Colonoscopy last Tues and having pain since.    05/24/2022 had the colonoscopy. It's in my back on right and left side and middle.    No abd pain.  Just back pain.    2. RADIATION: "Does the pain shoot anywhere else?" (e.g., chest, back)     Wed pain started.   I thought it was gas.    3. ONSET: "When did the pain begin?" (e.g., minutes, hours or days ago)      Wed. After the colonoscopy  When I move it hurts.  No shooting pain down either leg.   No urinary problems.   I'm having normal BMs.   No blood in urine or BMs.   4. SUDDEN: "Gradual or sudden onset?"     It started Wed.   The pain started in my lower back then on Thur when woke up it was really hurting.   If I move it hurts.   I had to stand up to laugh.   Sitting hurts. 5. PATTERN "Does the pain come and go, or is it constant?"    - If it comes and goes: "How long does it last?" "Do you have pain now?"     (Note: Comes and goes means the pain is intermittent. It goes away completely between bouts.)    - If constant: "Is it getting better, staying the same, or getting worse?"      (Note: Constant means the pain never goes away completely; most serious pain is constant and gets worse.)      I'm sitting now and it's not hurting but if I try to get up it hurts.   Walking makes it hurt after I've been up for a while.    6. SEVERITY: "How bad is the pain?"  (e.g., Scale 1-10; mild, moderate, or severe)    - MILD (1-3): Doesn't interfere with normal activities, abdomen soft and not tender to touch.     - MODERATE (4-7): Interferes with normal activities or awakens from sleep, abdomen tender to touch.     - SEVERE (8-10): Excruciating pain, doubled over, unable to do any  normal activities.       8/10 and have been 10/10 pain 7. RECURRENT SYMPTOM: "Have you ever had this type of stomach pain before?" If Yes, ask: "When was the last time?" and "What happened that time?"      I can't remember 8. CAUSE: "What do you think is causing the stomach pain?"     I don't know. 9. RELIEVING/AGGRAVATING FACTORS: "What makes it better or worse?" (e.g., antacids, bending or twisting motion, bowel movement)     Moving 10. OTHER SYMPTOMS: "Do you have any other symptoms?" (e.g., back pain, diarrhea, fever, urination pain, vomiting)       Coughing causes pain in center of my lower back. 11. PREGNANCY: "Is there any chance you are pregnant?" "When was your last menstrual period?"       N/A  Answer Assessment - Initial Assessment Questions 1. ONSET: "When did the pain begin?"      Last Wed.   Had a colonoscopy on Tues. 05/24/2022.   Back pain started Wed.  2. LOCATION: "Where  does it hurt?" (upper, mid or lower back)     Across lower back 3. SEVERITY: "How bad is the pain?"  (e.g., Scale 1-10; mild, moderate, or severe)   - MILD (1-3): Doesn't interfere with normal activities.    - MODERATE (4-7): Interferes with normal activities or awakens from sleep.    - SEVERE (8-10): Excruciating pain, unable to do any normal activities.      8/10 and at times 10/10 4. PATTERN: "Is the pain constant?" (e.g., yes, no; constant, intermittent)      Hurts with movement or turning 5. RADIATION: "Does the pain shoot into your legs or somewhere else?"     No 6. CAUSE:  "What do you think is causing the back pain?"      I don't know.   Maybe from the colonoscopy since it started hurting the day after I had it done. 7. BACK OVERUSE:  "Any recent lifting of heavy objects, strenuous work or exercise?"     No 8. MEDICINES: "What have you taken so far for the pain?" (e.g., nothing, acetaminophen, NSAIDS)     Heating pad and Tylenol 9. NEUROLOGIC SYMPTOMS: "Do you have any weakness, numbness, or  problems with bowel/bladder control?"     No     10. OTHER SYMPTOMS: "Do you have any other symptoms?" (e.g., fever, abdomen pain, burning with urination, blood in urine)       Denies urinary problems, diarrhea, N/V or back injuries 11. PREGNANCY: "Is there any chance you are pregnant?" "When was your last menstrual period?"       N/A  Protocols used: Abdominal Pain - Female-A-AH, Back Pain-A-AH

## 2022-05-30 NOTE — Telephone Encounter (Signed)
  Chief Complaint: Lower back pain Symptoms: Lower back pain that started last Wed. Frequency: Daily worse with movement.   Having spasms Pertinent Negatives: Patient denies injuries or urinary symptoms or GI symptoms.   Had colonoscopy on Tues. 05/24/2022. Disposition: '[]'$ ED /'[]'$ Urgent Care (no appt availability in office) / '[x]'$ Appointment(In office/virtual)/ '[]'$   Virtual Care/ '[]'$ Home Care/ '[]'$ Refused Recommended Disposition /'[]'$  Mobile Bus/ '[]'$  Follow-up with PCP Additional Notes: Appt made with Serafina Royals, FNP 06/01/2022 at 8:20

## 2022-06-01 ENCOUNTER — Other Ambulatory Visit: Payer: Self-pay

## 2022-06-01 ENCOUNTER — Encounter: Payer: Self-pay | Admitting: Nurse Practitioner

## 2022-06-01 ENCOUNTER — Ambulatory Visit (INDEPENDENT_AMBULATORY_CARE_PROVIDER_SITE_OTHER): Payer: Medicare HMO | Admitting: Nurse Practitioner

## 2022-06-01 VITALS — BP 124/80 | HR 96 | Temp 98.0°F | Resp 16 | Ht 62.0 in | Wt 158.3 lb

## 2022-06-01 DIAGNOSIS — M545 Low back pain, unspecified: Secondary | ICD-10-CM

## 2022-06-01 MED ORDER — NAPROXEN 500 MG PO TABS: 500.0000 mg | ORAL_TABLET | Freq: Two times a day (BID) | ORAL | 0 refills | Status: DC

## 2022-06-01 MED ORDER — LIDOCAINE 5 % EX PTCH: 1.0000 | MEDICATED_PATCH | CUTANEOUS | 0 refills | Status: DC

## 2022-06-01 MED ORDER — TIZANIDINE HCL 4 MG PO TABS: 4.0000 mg | ORAL_TABLET | Freq: Three times a day (TID) | ORAL | 2 refills | Status: DC | PRN

## 2022-06-01 NOTE — Progress Notes (Signed)
BP 124/80   Pulse 96   Temp 98 F (36.7 C) (Oral)   Resp 16   Ht 5' 2"  (1.575 m)   Wt 158 lb 4.8 oz (71.8 kg)   SpO2 97%   BMI 28.95 kg/m    Subjective:    Patient ID: Paula Mills, female    DOB: 1945/10/25, 76 y.o.   MRN: [id number]  HPI: Paula Mills is a 76 y.o. female  Chief Complaint  Patient presents with   Back Pain    Spasm at times for 1 week since having Colonoscopy   Low back pain: She says the back pain started on Wednesday last week. Patient reports she did have a colonoscopy on last Tuesday.  She states on Wednesday morning she woke up with a little bit of back pain she went to her workout class and it hurt even more.  Patient denies any fever, urinary complaints, changes in bowel movements, radiation of pain or abdominal pain.  She denies any trauma.  She also denies any incontinence of bowel or bladder.  Patient describes the pain as a muscle spasm.  Says it gets real tight and sharp.  States she has taken Tylenol for the pain.  Patient states that that has not really helped.  Discussed doing heat therapy.  Patient states she did try that but it did not really help much.  Discussed continuing that.  We will also prescribe lidocaine patches, muscle relaxer and NSAID.   Relevant past medical, surgical, family and social history reviewed and updated as indicated. Interim medical history since our last visit reviewed. Allergies and medications reviewed and updated.  Review of Systems  Constitutional: Negative for fever or weight change.  Respiratory: Negative for cough and shortness of breath.   Cardiovascular: Negative for chest pain or palpitations.  Gastrointestinal: Negative for abdominal pain, no bowel changes.  Musculoskeletal: Negative for gait problem or joint swelling.  Positive for low back pain Skin: Negative for rash.  Neurological: Negative for dizziness or headache.  No other specific complaints in a complete review of systems (except as listed  in HPI above).      Objective:    BP 124/80   Pulse 96   Temp 98 F (36.7 C) (Oral)   Resp 16   Ht 5' 2"  (1.575 m)   Wt 158 lb 4.8 oz (71.8 kg)   SpO2 97%   BMI 28.95 kg/m   Wt Readings from Last 3 Encounters:  06/01/22 158 lb 4.8 oz (71.8 kg)  05/24/22 159 lb (72.1 kg)  04/28/22 158 lb (71.7 kg)    Physical Exam  Constitutional: Patient appears well-developed and well-nourished. Obese  No distress.  HEENT: head atraumatic, normocephalic, pupils equal and reactive to light, neck supple Cardiovascular: Normal rate, regular rhythm and normal heart sounds.  No murmur heard. No BLE edema. Pulmonary/Chest: Effort normal and breath sounds normal. No respiratory distress. Abdominal: Soft.  There is no tenderness. Back: No tenderness on palpation.  Pain does increase with range of motion. Psychiatric: Patient has a normal mood and affect. behavior is normal. Judgment and thought content normal.  Results for orders placed or performed in visit on 08/02/21  Lipid panel  Result Value Ref Range   Cholesterol 298 (H) <200 mg/dL   HDL 72 > OR = 50 mg/dL   Triglycerides 95 <150 mg/dL   LDL Cholesterol (Calc) 204 (H) mg/dL (calc)   Total CHOL/HDL Ratio 4.1 <5.0 (calc)   Non-HDL Cholesterol (Calc)  226 (H) <130 mg/dL (calc)  CBC with Differential/Platelet  Result Value Ref Range   WBC 5.4 3.8 - 10.8 Thousand/uL   RBC 4.41 3.80 - 5.10 Million/uL   Hemoglobin 12.1 11.7 - 15.5 g/dL   HCT 36.8 35.0 - 45.0 %   MCV 83.4 80.0 - 100.0 fL   MCH 27.4 27.0 - 33.0 pg   MCHC 32.9 32.0 - 36.0 g/dL   RDW 13.6 11.0 - 15.0 %   Platelets 285 140 - 400 Thousand/uL   MPV 11.4 7.5 - 12.5 fL   Neutro Abs 3,235 1,500 - 7,800 cells/uL   Lymphs Abs 1,674 850 - 3,900 cells/uL   Absolute Monocytes 389 200 - 950 cells/uL   Eosinophils Absolute 81 15 - 500 cells/uL   Basophils Absolute 22 0 - 200 cells/uL   Neutrophils Relative % 59.9 %   Total Lymphocyte 31.0 %   Monocytes Relative 7.2 %   Eosinophils  Relative 1.5 %   Basophils Relative 0.4 %  COMPLETE METABOLIC PANEL WITH GFR  Result Value Ref Range   Glucose, Bld 82 65 - 99 mg/dL   BUN 9 7 - 25 mg/dL   Creat 0.65 0.60 - 1.00 mg/dL   eGFR 92 > OR = 60 mL/min/1.30m   BUN/Creatinine Ratio NOT APPLICABLE 6 - 22 (calc)   Sodium 139 135 - 146 mmol/L   Potassium 3.7 3.5 - 5.3 mmol/L   Chloride 103 98 - 110 mmol/L   CO2 26 20 - 32 mmol/L   Calcium 9.6 8.6 - 10.4 mg/dL   Total Protein 7.7 6.1 - 8.1 g/dL   Albumin 4.1 3.6 - 5.1 g/dL   Globulin 3.6 1.9 - 3.7 g/dL (calc)   AG Ratio 1.1 1.0 - 2.5 (calc)   Total Bilirubin 0.5 0.2 - 1.2 mg/dL   Alkaline phosphatase (APISO) 78 37 - 153 U/L   AST 20 10 - 35 U/L   ALT 17 6 - 29 U/L  VITAMIN D 25 Hydroxy (Vit-D Deficiency, Fractures)  Result Value Ref Range   Vit D, 25-Hydroxy 58 30 - 100 ng/mL  Vitamin B12  Result Value Ref Range   Vitamin B-12 591 200 - 1,100 pg/mL      Assessment & Plan:   Problem List Items Addressed This Visit   None Visit Diagnoses     Acute bilateral low back pain without sciatica    -  Primary   Rest, heat therapy.  Apply lidocaine patches.  Prescribing tizanidine and naproxen for pain.  Follow-up if no improvement.   Relevant Medications   tiZANidine (ZANAFLEX) 4 MG tablet   naproxen (NAPROSYN) 500 MG tablet   lidocaine (LIDODERM) 5 %        Follow up plan: Return if symptoms worsen or fail to improve.

## 2022-06-08 ENCOUNTER — Other Ambulatory Visit: Payer: Self-pay

## 2022-06-08 ENCOUNTER — Ambulatory Visit: Payer: Self-pay

## 2022-06-08 ENCOUNTER — Ambulatory Visit
Admission: RE | Admit: 2022-06-08 | Discharge: 2022-06-08 | Disposition: A | Discharge status: Home / Self Care | Payer: Medicare HMO | Source: Ambulatory Visit | Attending: Nurse Practitioner | Admitting: Nurse Practitioner

## 2022-06-08 ENCOUNTER — Encounter: Payer: Self-pay | Admitting: Nurse Practitioner

## 2022-06-08 ENCOUNTER — Ambulatory Visit
Admission: RE | Admit: 2022-06-08 | Discharge: 2022-06-08 | Disposition: A | Discharge status: Home / Self Care | Payer: Medicare HMO | Attending: Nurse Practitioner | Admitting: Nurse Practitioner

## 2022-06-08 ENCOUNTER — Ambulatory Visit (INDEPENDENT_AMBULATORY_CARE_PROVIDER_SITE_OTHER): Payer: PRIVATE HEALTH INSURANCE | Admitting: Nurse Practitioner

## 2022-06-08 VITALS — BP 122/74 | HR 64 | Temp 98.2°F | Resp 16 | Ht 62.0 in | Wt 159.6 lb

## 2022-06-08 DIAGNOSIS — J069 Acute upper respiratory infection, unspecified: Secondary | ICD-10-CM

## 2022-06-08 DIAGNOSIS — M545 Low back pain, unspecified: Secondary | ICD-10-CM

## 2022-06-08 NOTE — Telephone Encounter (Signed)
Summary: back pain/after colonoscopy .   Pt stated that she saw Paula Mills on 8/30 due to back pain after a colonoscopy and was given an Rx for pain and a muscle relaxer.   Pt stated still experiencing lower back pain, no blood in her stool, and her urine is fine.    Pt stated that when walking, her pain is about a 6. It's gotten better but she's still in pain. Pt mentioned that a possible x-ray may be needed.      Pt seeking clinical advice.      Chief Complaint: moderate lower back pain toward left side Symptoms: pain with coughing or standing Frequency: 05/25/22 Pertinent Negatives: Patient denies numbness or weakness lower extremities, no incontinence of bowel or bladder, urinary sx.fever, abd. pain Disposition: '[]'$ ED /'[]'$ Urgent Care (no appt availability in office) / '[x]'$ Appointment(In office/virtual)/ '[]'$  [facility]/ '[]'$ Home Care/ '[]'$ Refused Recommended Disposition /'[]'$ [facility]/ '[]'$  Follow-up with PCP Additional Notes: tested neg for Covid also has congested cough  Reason for Disposition  [1] MODERATE back pain (e.g., interferes with normal activities) AND [2] present > 3 days  Answer Assessment - Initial Assessment Questions 1. ONSET: "When did the pain begin?"      05/25/22 2. LOCATION: "Where does it hurt?" (upper, mid or lower back)     Lower back left side 3. SEVERITY: "How bad is the pain?"  (e.g., Scale 1-10; mild, moderate, or severe)   - MILD (1-3): Doesn't interfere with normal activities.    - MODERATE (4-7): Interferes with normal activities or awakens from sleep.    - SEVERE (8-10): Excruciating pain, unable to do any normal activities.      Pain is better- moderate 4. PATTERN: "Is the pain constant?" (e.g., yes, no; constant, intermittent)      yes 5. RADIATION: "Does the pain shoot into your legs or somewhere else?"     no 6. CAUSE:  "What do you think is causing the back pain?"      Working out? 7. BACK OVERUSE:  "Any recent lifting of heavy  objects, strenuous work or exercise?"     exercise 8. MEDICINES: "What have you taken so far for the pain?" (e.g., nothing, acetaminophen, NSAIDS)     Pain med, muscle relaxer 9. NEUROLOGIC SYMPTOMS: "Do you have any weakness, numbness, or problems with bowel/bladder control?"     no 10. OTHER SYMPTOMS: "Do you have any other symptoms?" (e.g., fever, abdomen pain, burning with urination, blood in urine)       Hurts coughing and standing, laynig down with pain 11. PREGNANCY: "Is there any chance you are pregnant?" "When was your last menstrual period?"       N/a  Protocols used: Back Pain-A-AH

## 2022-06-08 NOTE — Progress Notes (Signed)
BP 122/74   Pulse 64   Temp 98.2 F (36.8 C) (Oral)   Resp 16   Ht 5' 2"  (1.575 m)   Wt 159 lb 9.6 oz (72.4 kg)   SpO2 97%   BMI 29.19 kg/m    Subjective:    Patient ID: Paula Mills, female    DOB: [birth year], 76 y.o.   MRN: [id number]  HPI: Paula Mills is a 76 y.o. female  Chief Complaint  Patient presents with   Back Pain    Low back for 3 weeks since having Colonoscopy follow up   Low back pain: Patient was seen on 06/01/2022.  At that time she reported that her back pain had started Wednesday the week before.  She had a colonoscopy on Tuesday.  She says she woke up Wednesday morning with a little bit of back pain she went to her workout class and it hurt even more.  Patient denies any fever, urinary complaints, changes in bowel movements, radiation of pain her abdominal pain.  She denies any trauma.  She denies any incontinence of bowel or bladder.  She describes the pain as a spasm.  She says it gets real tight and sharp.  She has been taken Tylenol for pain she really did not help.  At last visit I prescribed her tizanidine, naproxen and lidocaine patches.  She reports she still having the pain,she says it has improved but still present.   Patient had a DEXA scan on 12/13/2021 which did show osteoporosis in her spine.  She was taking some max but not taking it every week.  She then decided to stop taking it altogether because she concerned about side effects.  Dr. Ancil Boozer had discussed in length about high calcium diet, vitamin D supplementation and offered her a referral to endocrinology for other options.  She has not decided what she wants to do about that.  Discussed doing a course of steroids for her back pain and she has declined. Plan is to get xray to rule out fracture due to history of osteoporosis.  If no abnormality will refer to physical therapy.  If abnormal will refer to orthopedics.     URI:  patient reports she has had nasal congestion, cough and fatigue for  about a week.  She denies any fever or shortness of breath.  Will get covid test. Discussed she can take Zyrtec, Flonase, Robitussin and Mucinex.  Discussed pushing fluids and getting rest.  Relevant past medical, surgical, family and social history reviewed and updated as indicated. Interim medical history since our last visit reviewed. Allergies and medications reviewed and updated.  Review of Systems Constitutional: Negative for fever or weight change.  Positive for fatigue HEENT: Positive for nasal congestion Respiratory: Positive for cough and negative shortness of breath.   Cardiovascular: Negative for chest pain or palpitations.  Gastrointestinal: Negative for abdominal pain, no bowel changes.  Musculoskeletal: Negative for gait problem or joint swelling. Positive for low back pain Skin: Negative for rash.  Neurological: Negative for dizziness or headache.  No other specific complaints in a complete review of systems (except as listed in HPI above).      Objective:    BP 122/74   Pulse 64   Temp 98.2 F (36.8 C) (Oral)   Resp 16   Ht 5' 2"  (1.575 m)   Wt 159 lb 9.6 oz (72.4 kg)   SpO2 97%   BMI 29.19 kg/m   Wt Readings from Last 3  Encounters:  06/08/22 159 lb 9.6 oz (72.4 kg)  06/01/22 158 lb 4.8 oz (71.8 kg)  05/24/22 159 lb (72.1 kg)    Physical Exam  Constitutional: Patient appears well-developed and well-nourished. Obese  No distress.  HEENT: head atraumatic, normocephalic, pupils equal and reactive to light, neck supple, Cardiovascular: Normal rate, regular rhythm and normal heart sounds.  No murmur heard. No BLE edema. Pulmonary/Chest: Effort normal and breath sounds normal. No respiratory distress. Abdominal: Soft.  There is no tenderness. MSK: No tenderness noted on palpation on lower back Psychiatric: Patient has a normal mood and affect. behavior is normal. Judgment and thought content normal.  Results for orders placed or performed in visit on 08/02/21   Lipid panel  Result Value Ref Range   Cholesterol 298 (H) <200 mg/dL   HDL 72 > OR = 50 mg/dL   Triglycerides 95 <150 mg/dL   LDL Cholesterol (Calc) 204 (H) mg/dL (calc)   Total CHOL/HDL Ratio 4.1 <5.0 (calc)   Non-HDL Cholesterol (Calc) 226 (H) <130 mg/dL (calc)  CBC with Differential/Platelet  Result Value Ref Range   WBC 5.4 3.8 - 10.8 Thousand/uL   RBC 4.41 3.80 - 5.10 Million/uL   Hemoglobin 12.1 11.7 - 15.5 g/dL   HCT 36.8 35.0 - 45.0 %   MCV 83.4 80.0 - 100.0 fL   MCH 27.4 27.0 - 33.0 pg   MCHC 32.9 32.0 - 36.0 g/dL   RDW 13.6 11.0 - 15.0 %   Platelets 285 140 - 400 Thousand/uL   MPV 11.4 7.5 - 12.5 fL   Neutro Abs 3,235 1,500 - 7,800 cells/uL   Lymphs Abs 1,674 850 - 3,900 cells/uL   Absolute Monocytes 389 200 - 950 cells/uL   Eosinophils Absolute 81 15 - 500 cells/uL   Basophils Absolute 22 0 - 200 cells/uL   Neutrophils Relative % 59.9 %   Total Lymphocyte 31.0 %   Monocytes Relative 7.2 %   Eosinophils Relative 1.5 %   Basophils Relative 0.4 %  COMPLETE METABOLIC PANEL WITH GFR  Result Value Ref Range   Glucose, Bld 82 65 - 99 mg/dL   BUN 9 7 - 25 mg/dL   Creat 0.65 0.60 - 1.00 mg/dL   eGFR 92 > OR = 60 mL/min/1.66m   BUN/Creatinine Ratio NOT APPLICABLE 6 - 22 (calc)   Sodium 139 135 - 146 mmol/L   Potassium 3.7 3.5 - 5.3 mmol/L   Chloride 103 98 - 110 mmol/L   CO2 26 20 - 32 mmol/L   Calcium 9.6 8.6 - 10.4 mg/dL   Total Protein 7.7 6.1 - 8.1 g/dL   Albumin 4.1 3.6 - 5.1 g/dL   Globulin 3.6 1.9 - 3.7 g/dL (calc)   AG Ratio 1.1 1.0 - 2.5 (calc)   Total Bilirubin 0.5 0.2 - 1.2 mg/dL   Alkaline phosphatase (APISO) 78 37 - 153 U/L   AST 20 10 - 35 U/L   ALT 17 6 - 29 U/L  VITAMIN D 25 Hydroxy (Vit-D Deficiency, Fractures)  Result Value Ref Range   Vit D, 25-Hydroxy 58 30 - 100 ng/mL  Vitamin B12  Result Value Ref Range   Vitamin B-12 591 200 - 1,100 pg/mL      Assessment & Plan:   Problem List Items Addressed This Visit   None Visit Diagnoses      Acute bilateral low back pain without sciatica    -  Primary   We will get lumbar x-ray if negative will refer to  physical therapy if abnormal will send to orthopedics.   Relevant Orders   DG Lumbar Spine Complete   Viral upper respiratory tract infection       Obtain COVID test, can start taking Zyrtec, Flonase, Mucinex, Robitussin.  Push fluids and get rest.   Relevant Orders   Novel Coronavirus, NAA ([facility])        Follow up plan: Return if symptoms worsen or fail to improve.

## 2022-06-09 LAB — NOVEL CORONAVIRUS, NAA: SARS-CoV-2, NAA: NOT DETECTED

## 2022-06-10 ENCOUNTER — Other Ambulatory Visit: Payer: Self-pay | Admitting: Nurse Practitioner

## 2022-06-10 DIAGNOSIS — M439 Deforming dorsopathy, unspecified: Secondary | ICD-10-CM

## 2022-06-10 DIAGNOSIS — M810 Age-related osteoporosis without current pathological fracture: Secondary | ICD-10-CM

## 2022-06-10 DIAGNOSIS — M545 Low back pain, unspecified: Secondary | ICD-10-CM

## 2022-06-13 NOTE — Addendum Note (Signed)
Addended by: Karle Barr on: 06/13/2022 08:27 AM   Modules accepted: Level of Service

## 2022-07-05 ENCOUNTER — Ambulatory Visit: Payer: Medicare HMO | Admitting: Physical Therapy

## 2022-07-12 ENCOUNTER — Other Ambulatory Visit: Payer: Self-pay

## 2022-07-12 ENCOUNTER — Ambulatory Visit: Payer: Medicare HMO | Attending: Nurse Practitioner | Admitting: Physical Therapy

## 2022-07-12 ENCOUNTER — Ambulatory Visit: Payer: Medicare HMO | Admitting: Physical Therapy

## 2022-07-12 ENCOUNTER — Encounter: Payer: Self-pay | Admitting: Physical Therapy

## 2022-07-12 DIAGNOSIS — M545 Low back pain, unspecified: Secondary | ICD-10-CM | POA: Diagnosis present

## 2022-07-12 DIAGNOSIS — M6281 Muscle weakness (generalized): Secondary | ICD-10-CM | POA: Diagnosis not present

## 2022-07-12 DIAGNOSIS — M810 Age-related osteoporosis without current pathological fracture: Secondary | ICD-10-CM | POA: Insufficient documentation

## 2022-07-12 DIAGNOSIS — M439 Deforming dorsopathy, unspecified: Secondary | ICD-10-CM | POA: Insufficient documentation

## 2022-07-12 NOTE — Therapy (Unsigned)
OUTPATIENT PHYSICAL THERAPY LOWER EXTREMITY EVALUATION   Patient Name: Paula Mills MRN: [id number] DOB:1945-10-21, 76 y.o., female Today's Date: 07/13/2022   PT End of Session - 07/13/22 0958     Visit Number 1    Number of Visits 5    Date for PT Re-Evaluation 08/17/22    Authorization Type [employer] Medicare    PT Start Time 1545    PT Stop Time 1630    PT Time Calculation (min) 45 min    Activity Tolerance Patient tolerated treatment well    Behavior During Therapy WFL for tasks assessed/performed             Past Medical History:  Diagnosis Date   Hyperlipidemia    Vertigo    Past Surgical History:  Procedure Laterality Date   ABDOMINAL HYSTERECTOMY     APPENDECTOMY N/A 03/14/2015   Procedure: APPENDECTOMY;  Surgeon: Molly Maduro, MD;  Location: [facility];  Service: General;  Laterality: N/A;   CESAREAN SECTION     COLONOSCOPY WITH PROPOFOL N/A 01/31/2017   Procedure: COLONOSCOPY WITH PROPOFOL;  Surgeon: Lucilla Lame, MD;  Location: [facility];  Service: Endoscopy;  Laterality: N/A;   COLONOSCOPY WITH PROPOFOL N/A 05/24/2022   Procedure: COLONOSCOPY WITH PROPOFOL;  Surgeon: Lucilla Lame, MD;  Location: [facility];  Service: Endoscopy;  Laterality: N/A;   LAPAROTOMY N/A 03/14/2015   Procedure: EXPLORATORY LAPAROTOMY;  Surgeon: Molly Maduro, MD;  Location: [facility];  Service: General;  Laterality: N/A;   Patient Active Problem List   Diagnosis Date Noted   History of colonic polyps    Screen for colon cancer    Benign neoplasm of ascending colon    Polyp of sigmoid colon    Carotid artery calcification, bilateral 10/18/2016   At risk for falling 04/08/2016   H/O: HTN (hypertension) 04/08/2016   Hypo-ovarianism 04/08/2016   Asymptomatic varicose veins 04/08/2016   Avitaminosis D 04/08/2016   Hiatal hernia 02/09/2016   Liver cyst 02/09/2016   Lumbar spondylosis 02/09/2016   Osteoporosis 02/09/2016   Dyslipidemia 02/09/2016   Vegetarian 02/09/2016    Vitamin D deficiency 02/09/2016   Calcification of abdominal aorta ([place]) 02/09/2016   History of small bowel obstruction 03/12/2015    PCP: Dr. Ancil Boozer   REFERRING PROVIDER: FNP Serafina Royals   REFERRING DIAG:  M43.9 (ICD-10-CM) - Compression deformity of vertebra M54.50 (ICD-10-CM) - Acute bilateral low back pain without sciatica M81.0 (ICD-10-CM) - Age-related osteoporosis without current pathological fracture  THERAPY DIAG:  Acute left-sided low back pain without sciatica  Rationale for Evaluation and Treatment Rehabilitation  ONSET DATE: 06/28/22   SUBJECTIVE:   SUBJECTIVE STATEMENT: See pertinent history   PERTINENT HISTORY: Pt reports being active and going to gym regularly. Pt had a colonoscopy on Septemebr 26th and she went to the gym and she moved a certain way where she felt a pulling in the left side of her low back. She began to hurt in left side of her low back pain the next day and she decided to stop working out for the next couple of days. She went to her PCP who gave her muscle relaxors which helped the pain. She has since gone to PCP who performed X-ray who found compression of vertebrae. The pain has since subsided since September 26th. She had this pain several years ago but pain went away after several days.   PAIN:  Are you having pain? Yes: NPRS scale: 2/10 Pain location: Lower spine to left paraspinal  Pain description:  Dull  Aggravating factors: Standing on her feet for a long time especially when washing dishes  Relieving factors: Sitting down   PRECAUTIONS: None  WEIGHT BEARING RESTRICTIONS No  FALLS:  Has patient fallen in last 6 months? Yes. Number of falls 1, mechanical fall where she tripped over step, but no injuries   LIVING ENVIRONMENT: Lives with: lives with their spouse Lives in: House/apartment Stairs: Yes: Internal: 13 steps; on right going up and External: 1 steps; none Has following equipment at home: None  OCCUPATION: Retired    PLOF: Independent and Leisure: [place] Manage pain with low back pain. Wants to not have low back pain    OBJECTIVE:                VITALS: BP 130/57 HR 90 SpO2 100  DIAGNOSTIC FINDINGS:   CLINICAL DATA:  Low back pain since colonoscopy on May 25, 2022.   EXAM: LUMBAR SPINE - COMPLETE 4+ VIEW   COMPARISON:  October 30, 2017   FINDINGS: 20% compression deformity L3 and L4 vertebral bodies are identified new compared to prior exam, age indeterminate. Decreased intervertebral space at L5-S1 identified. Anterior spurring is identified in the lower thoracic spine. Extensive bowel content is identified throughout the colon. Scoliosis of spine noted.   IMPRESSION: 1. 20% compression deformity L3 and L4 vertebral bodies are identified new compared to prior exam, age indeterminate. 2. Extensive bowel content identified throughout the colon.     Electronically Signed   By: Abelardo Diesel M.D.   On: 06/09/2022 15:23  PATIENT SURVEYS:  FOTO Did not finish   COGNITION:  Overall cognitive status: Within functional limits for tasks assessed     SENSATION: WFL   MUSCLE LENGTH: Hamstrings: Right 90 deg; Left 90 deg Thomas test: Negative bilateral                        Ely's test: Positive bilateral   POSTURE: No Significant postural limitations  PALPATION: Left glute         LOWER EXTREMITY ROM:      Lumbar ROM:                                               Flexion: 100%                                               Extension:100%                                               SB R/L: 100%/100%                                               Rot R/L: 100%/100%   Active  Right 07/12/2022 Left 07/12/2022  Hip flexion 120 120  Hip extension 30 30  Hip abduction 45 45  Hip adduction 30 30  Hip internal rotation 45 45  Hip external rotation 45 45  Knee flexion 135 135  Knee extension 0 0  Ankle dorsiflexion 20 20  Ankle plantarflexion 50 50   Ankle inversion    Ankle eversion     (Blank rows = not tested)     LOWER EXTREMITY MMT:  MMT Right eval Left eval  Hip flexion 5 5  Hip extension 4 4  Hip abduction 4 4  Hip adduction 4- 4-  Hip internal rotation    Hip external rotation    Knee flexion 5 5  Knee extension 5 5  Ankle dorsiflexion 5 5  Ankle plantarflexion 5 5  Ankle inversion    Ankle eversion     (Blank rows = not tested)  LOWER EXTREMITY SPECIAL TESTS:  Hip special tests: Saralyn Pilar (FABER) test: negative and Ely's test: positive  FADIR: Negative bilateral   GAIT: Distance walked: 50 ft  Assistive device utilized: None Level of assistance: Complete Independence Comments: No abnormalities noted     TODAY'S TREATMENT: Prone Quad Stretch 3 x 30 sec  Seated Hip ER Stretch 3 x 30 sec     PATIENT EDUCATION:  Education details: form and technique for appropriate exercise and explanation about muscle pull and healing timeline  Person educated: Patient Education method: Customer service manager Education comprehension: verbalized understanding and returned demonstration   HOME EXERCISE PROGRAM: Access Code: [email or web address] URL: [email or web address] Date: 07/12/2022 Prepared by: Bradly Chris  Exercises - Prone Quadriceps Stretch with Strap  - 1 x daily - 3 reps - 30-60 sec hold - Seated Hip External Rotation Stretch  - 1 x daily - 3 reps - 30-60 sec  hold  ASSESSMENT:  CLINICAL IMPRESSION: Patient is a 76 y.o. AA female who was seen today for physical therapy evaluation and treatment for left side low back pain with acute onset. Given acute nature of low back pain and decrease in pain over the past several weeks, it seems likely that she has signs and symptoms of a muscle strain in the left side of her low back or gluteal region. Lumbar radiculopathy ruled out during examination as well as other hip pathology such as hip OA with negative FADIR and no pain at end range hip flexion  and extension. PT recommends five visit plan of care spread out over five weeks to monitor symptoms and to educate patient on performing light stretching to relieve muscular tightness associated with strain as well as strengthening for hip strength deficits. She is in agreement with this plan and she will benefit from skilled PT to improve hip strength and to decrease left low back pain to return to regular exercise without experiencing excessive pain.     OBJECTIVE IMPAIRMENTS decreased strength, impaired flexibility, and pain.   ACTIVITY LIMITATIONS lifting, bending, and squatting  PARTICIPATION LIMITATIONS:  Exercise classes at gym   PERSONAL FACTORS Age are also affecting patient's functional outcome.   REHAB POTENTIAL: Excellent  CLINICAL DECISION MAKING: Stable/uncomplicated  EVALUATION COMPLEXITY: Low   GOALS: Goals reviewed with patient? No  SHORT TERM GOALS: Target date: 07/27/2022  Pt will be independent with HEP in order to improve strength and balance in order to decrease fall risk and improve function at home and work. Baseline:  NT  Goal status: INITIAL   LONG TERM GOALS: Target date: 09/21/2022   Patient will have improved function and activity level as evidenced by an increase in FOTO score by 10 points or more.  Baseline: Not completed  Goal status: INITIAL  2.  Patient will be educated on HEP  for hip stretches as well as modalities and treatments for muscle strains as a way for her to independently treat future stiffness associated with muscle strain  Baseline: Not complete Goal status: INITIAL  3.  Patient will improve hip strength for improved conditioning to avoid future muscle strains in low back and hip to continue pursuing recreational exercise injury free.  Baseline: Hip Abduction R/L 4/4, Hip Adduction R/L 4-/4-, Hip Extension R/L 4/4  Goal status: INITIAL   PLAN: PT FREQUENCY: 1x/week   PT DURATION: 5 weeks  PLANNED INTERVENTIONS: Therapeutic  exercises, Neuromuscular re-education, Balance training, Gait training, Patient/Family education, Self Care, Joint mobilization, Joint manipulation, Stair training, Aquatic Therapy, Dry Needling, Spinal manipulation, Spinal mobilization, Cryotherapy, Moist heat, Manual therapy, and Re-evaluation  PLAN FOR NEXT SESSION: Educate patient on hip stretches and begin hip strengthening   Bradly Chris PT, DPT  07/13/2022, 9:59 AM

## 2022-07-14 ENCOUNTER — Ambulatory Visit: Payer: PRIVATE HEALTH INSURANCE | Admitting: Physical Therapy

## 2022-07-18 ENCOUNTER — Ambulatory Visit: Payer: Medicare HMO | Admitting: Physical Therapy

## 2022-07-18 ENCOUNTER — Telehealth: Payer: Self-pay | Admitting: Family Medicine

## 2022-07-18 DIAGNOSIS — M545 Low back pain, unspecified: Secondary | ICD-10-CM

## 2022-07-18 NOTE — Telephone Encounter (Signed)
Called patient and let her know we do not do Yellow Fever vaccine here. I let her know she can go to [facility] in [place] and get it done there. I gave her their office number.Patient verbalized understanding.

## 2022-07-18 NOTE — Telephone Encounter (Unsigned)
Copied from [place] [id number]. Topic: Appointment Scheduling - Scheduling Inquiry for Clinic >> Jul 18, 2022 12:23 PM Sabas Sous wrote: Reason for CRM: Pt called to see if PCP would be able to give her a yellow fever vaccine   Best contact: [phone]  Wants a call back to discuss

## 2022-07-18 NOTE — Therapy (Signed)
OUTPATIENT PHYSICAL THERAPY TREATMENT NOTE   Patient Name: Paula Mills MRN: [id number] DOB:1945/10/26, 76 y.o., female Today's Date: 07/18/2022  PCP: Dr. Steele Sizer  REFERRING PROVIDER: FNP Serafina Royals   END OF SESSION:   PT End of Session - 07/18/22 1718     Visit Number 2    Number of Visits 5    Date for PT Re-Evaluation 08/17/22    Authorization Type A[employer]na Medicare    Authorization Time Period 07/12/22-08/17/22    PT Start Time 1630    PT Stop Time 1715    PT Time Calculation (min) 45 min    Activity Tolerance Patient tolerated treatment well    Behavior During Therapy WFL for tasks assessed/performed             Past Medical History:  Diagnosis Date   Hyperlipidemia    Vertigo    Past Surgical History:  Procedure Laterality Date   ABDOMINAL HYSTERECTOMY     APPENDECTOMY N/A 03/14/2015   Procedure: APPENDECTOMY;  Surgeon: Molly Maduro, MD;  Location: [facility];  Service: General;  Laterality: N/A;   CESAREAN SECTION     COLONOSCOPY WITH PROPOFOL N/A 01/31/2017   Procedure: COLONOSCOPY WITH PROPOFOL;  Surgeon: Lucilla Lame, MD;  Location: [facility];  Service: Endoscopy;  Laterality: N/A;   COLONOSCOPY WITH PROPOFOL N/A 05/24/2022   Procedure: COLONOSCOPY WITH PROPOFOL;  Surgeon: Lucilla Lame, MD;  Location: [facility];  Service: Endoscopy;  Laterality: N/A;   LAPAROTOMY N/A 03/14/2015   Procedure: EXPLORATORY LAPAROTOMY;  Surgeon: Molly Maduro, MD;  Location: [facility];  Service: General;  Laterality: N/A;   Patient Active Problem List   Diagnosis Date Noted   History of colonic polyps    Screen for colon cancer    Benign neoplasm of ascending colon    Polyp of sigmoid colon    Carotid artery calcification, bilateral 10/18/2016   At risk for falling 04/08/2016   H/O: HTN (hypertension) 04/08/2016   Hypo-ovarianism 04/08/2016   Asymptomatic varicose veins 04/08/2016   Avitaminosis D 04/08/2016   Hiatal hernia 02/09/2016   Liver cyst  02/09/2016   Lumbar spondylosis 02/09/2016   Osteoporosis 02/09/2016   Dyslipidemia 02/09/2016   Vegetarian 02/09/2016   Vitamin D deficiency 02/09/2016   Calcification of abdominal aorta ([place]) 02/09/2016   History of small bowel obstruction 03/12/2015    REFERRING DIAG: M43.9 (ICD-10-CM) - Compression deformity of vertebra M54.50 (ICD-10-CM) - Acute bilateral low back pain without sciatica M81.0 (ICD-10-CM) - Age-related osteoporosis without current pathological fracture   THERAPY DIAG:  Acute left-sided low back pain without sciatica  Rationale for Evaluation and Treatment Rehabilitation  PERTINENT HISTORY: Pt reports being active and going to gym regularly. Pt had a colonoscopy on Septemebr 26th and she went to the gym and she moved a certain way where she felt a pulling in the left side of her low back. She began to hurt in left side of her low back pain the next day and she decided to stop working out for the next couple of days. She went to her PCP who gave her muscle relaxors which helped the pain. She has since gone to PCP who performed X-ray who found compression of vertebrae. The pain has since subsided since September 26th. She had this pain several years ago but pain went away after several days.   PRECAUTIONS: None  SUBJECTIVE: Pt reports continued left sided buttocks pain that worsens when she is standing for a long period of time. This pain  has improved since when she first had what she believed was a muscle strain.   PAIN:  Are you having pain? Yes: NPRS scale: 6/10 Pain location: Left buttocks  Pain description: Achy  Aggravating factors: Standing for long period of time  Relieving factors: Not standing for a long period of time    OBJECTIVE:                 VITALS: BP 130/57 HR 90 SpO2 100   DIAGNOSTIC FINDINGS:    CLINICAL DATA:  Low back pain since colonoscopy on May 25, 2022.   EXAM: LUMBAR SPINE - COMPLETE 4+ VIEW   COMPARISON:  October 30, 2017    FINDINGS: 20% compression deformity L3 and L4 vertebral bodies are identified new compared to prior exam, age indeterminate. Decreased intervertebral space at L5-S1 identified. Anterior spurring is identified in the lower thoracic spine. Extensive bowel content is identified throughout the colon. Scoliosis of spine noted.   IMPRESSION: 1. 20% compression deformity L3 and L4 vertebral bodies are identified new compared to prior exam, age indeterminate. 2. Extensive bowel content identified throughout the colon.     Electronically Signed   By: Abelardo Diesel M.D.   On: 06/09/2022 15:23   PATIENT SURVEYS:  FOTO Did not finish    COGNITION:           Overall cognitive status: Within functional limits for tasks assessed                          SENSATION: WFL     MUSCLE LENGTH: Hamstrings: Right 90 deg; Left 90 deg Thomas test: Negative bilateral                        Ely's test: Positive bilateral    POSTURE: No Significant postural limitations   PALPATION: Left glute          LOWER EXTREMITY ROM:       Lumbar ROM:                                               Flexion: 100%                                               Extension:100%                                               SB R/L: 100%/100%                                               Rot R/L: 100%/100%     Active  Right 07/12/2022 Left 07/12/2022  Hip flexion 120 120  Hip extension 30 30  Hip abduction 45 45  Hip adduction 30 30  Hip internal rotation 45 45  Hip external rotation 45 45  Knee flexion 135 135  Knee extension 0 0  Ankle dorsiflexion 20 20  Ankle plantarflexion  50 50  Ankle inversion      Ankle eversion       (Blank rows = not tested)        LOWER EXTREMITY MMT:   MMT Right eval Left eval  Hip flexion 5 5  Hip extension 4 4  Hip abduction 4 4  Hip adduction 4- 4-  Hip internal rotation      Hip external rotation      Knee flexion 5 5  Knee extension 5 5  Ankle  dorsiflexion 5 5  Ankle plantarflexion 5 5  Ankle inversion      Ankle eversion       (Blank rows = not tested)   LOWER EXTREMITY SPECIAL TESTS:  Hip special tests: Saralyn Pilar (FABER) test: negative and Ely's test: positive  FADIR: Negative bilateral    GAIT: Distance walked: 50 ft  Assistive device utilized: None Level of assistance: Complete Independence Comments: No abnormalities noted        TODAY'S TREATMENT: 07/18/22:             Nu-Step Level 5 for arms and seat level with level 4 resistance for 5 min              Single Knee to Chest 6 x 30 sec             Side Lying Hip Abduction on RLE 1 X 10             Side Lying Hip Abduction on RLE with Red band  1 X 10             Side Lying Hip Abduction on RLE with Green band  2 X 10 with RLE and 3 x 10 with LLE              Mini-Squat with taps on 20 inch mat height 3 x 10                         -min VC for posterior weight shift and upright posture                           Education on delayed onset muscle soreness and timeline for recovery             Initial:   Prone Quad Stretch 3 x 30 sec  Seated Hip ER Stretch 3 x 30 sec        PATIENT EDUCATION:  Education details: form and technique for appropriate exercise and explanation about muscle pull and healing timeline  Person educated: Patient Education method: Customer service manager Education comprehension: verbalized understanding and returned demonstration     HOME EXERCISE PROGRAM: Access Code: [email or web address] URL: [email or web address] Date: 07/18/2022 Prepared by: Bradly Chris  Exercises - Prone Quadriceps Stretch with Strap  - 1 x daily - 3 reps - 30-60 sec hold - Seated Hip External Rotation Stretch  - 1 x daily - 3 reps - 30-60 sec  hold - Supine Single Knee to Chest Stretch  - 1 x daily - 3 reps - 30-60 sec  hold - Sidelying Hip Abduction  - 3 x weekly - 3 sets - 10 reps - Mini Squat  - 3 x weekly - 3 sets - 10 reps   ASSESSMENT:    CLINICAL IMPRESSION:  Pt presents for f/u for left sided low back and hip pain. She was able to perform all exercises without  an increase in her pain. Given imaging, explained to patient that in addition to hip strain pt could also be experiencing referred pain from spine. She will continue to benefit from skilled PT to improve hip strength and to decrease left low back pain to return to regular exercise without experiencing excessive pain.     OBJECTIVE IMPAIRMENTS decreased strength, impaired flexibility, and pain.    ACTIVITY LIMITATIONS lifting, bending, and squatting   PARTICIPATION LIMITATIONS:  Exercise classes at gym    PERSONAL FACTORS Age are also affecting patient's functional outcome.    REHAB POTENTIAL: Excellent   CLINICAL DECISION MAKING: Stable/uncomplicated   EVALUATION COMPLEXITY: Low     GOALS: Goals reviewed with patient? No   SHORT TERM GOALS: Target date: 07/27/2022  Pt will be independent with HEP in order to improve strength and balance in order to decrease fall risk and improve function at home and work. Baseline:  Completing exercises independently  Goal status: Ongoing      LONG TERM GOALS: Target date: 09/21/2022    Patient will have improved function and activity level as evidenced by an increase in FOTO score by 10 points or more.  Baseline: Not completed  Goal status: Ongoing    2.  Patient will be educated on HEP for hip stretches as well as modalities and treatments for muscle strains as a way for her to independently treat future stiffness associated with muscle strain  Baseline: Not complete Goal status: Ongoing    3.  Patient will improve hip strength for improved conditioning to avoid future muscle strains in low back and hip to continue pursuing recreational exercise injury free.  Baseline: Hip Abduction R/L 4/4, Hip Adduction R/L 4-/4-, Hip Extension R/L 4/4  Goal status: Ongoing      PLAN: PT FREQUENCY: 1x/week    PT DURATION: 5  weeks   PLANNED INTERVENTIONS: Therapeutic exercises, Neuromuscular re-education, Balance training, Gait training, Patient/Family education, Self Care, Joint mobilization, Joint manipulation, Stair training, Aquatic Therapy, Dry Needling, Spinal manipulation, Spinal mobilization, Cryotherapy, Moist heat, Manual therapy, and Re-evaluation   PLAN FOR NEXT SESSION: Complete FOTO. Educate patient on hip stretches and begin hip strengthening    Bradly Chris PT, DPT  07/18/2022, 5:19 PM

## 2022-07-19 NOTE — Telephone Encounter (Signed)
Called and let patient know. She stated she needs a letter with Dr. Tobie Poet okay. Letter written and patient will pick-up when able.

## 2022-07-19 NOTE — Telephone Encounter (Unsigned)
Copied from [place] [id number]. Topic: General - Other >> Jul 19, 2022  4:12 PM [place] B wrote: Reason for UZH:[id number] states is wanting to get yellow fever shot at [facility] , because she is going out of the country, but wants to know if its ok with Dr Ancil Boozer if she gets this shot.

## 2022-07-21 ENCOUNTER — Ambulatory Visit: Payer: Medicare HMO | Admitting: Physical Therapy

## 2022-07-26 ENCOUNTER — Encounter: Payer: PRIVATE HEALTH INSURANCE | Admitting: Physical Therapy

## 2022-07-28 ENCOUNTER — Ambulatory Visit: Payer: Medicare HMO | Admitting: Physical Therapy

## 2022-07-28 ENCOUNTER — Encounter: Payer: Self-pay | Admitting: Physical Therapy

## 2022-07-28 DIAGNOSIS — M545 Low back pain, unspecified: Secondary | ICD-10-CM | POA: Diagnosis not present

## 2022-07-28 NOTE — Therapy (Signed)
OUTPATIENT PHYSICAL THERAPY TREATMENT NOTE   Patient Name: Paula Mills MRN: [id number] DOB:[birth year], 76 y.o., female Today's Date: 07/28/2022  PCP: Dr. Steele Sizer  REFERRING PROVIDER: FNP Serafina Royals   END OF SESSION:   PT End of Session - 07/28/22 1554     Visit Number 3    Number of Visits 5    Date for PT Re-Evaluation 08/17/22    Authorization Type [employer]    Authorization Time Period 07/12/22-08/17/22    PT Start Time 1550    PT Stop Time 1630    PT Time Calculation (min) 40 min    Activity Tolerance Patient tolerated treatment well    Behavior During Therapy WFL for tasks assessed/performed             Past Medical History:  Diagnosis Date   Hyperlipidemia    Vertigo    Past Surgical History:  Procedure Laterality Date   ABDOMINAL HYSTERECTOMY     APPENDECTOMY N/A 03/14/2015   Procedure: APPENDECTOMY;  Surgeon: Molly Maduro, MD;  Location: [facility] ORS;  Service: General;  Laterality: N/A;   CESAREAN SECTION     COLONOSCOPY WITH PROPOFOL N/A 01/31/2017   Procedure: COLONOSCOPY WITH PROPOFOL;  Surgeon: Lucilla Lame, MD;  Location: [facility] ENDOSCOPY;  Service: Endoscopy;  Laterality: N/A;   COLONOSCOPY WITH PROPOFOL N/A 05/24/2022   Procedure: COLONOSCOPY WITH PROPOFOL;  Surgeon: Lucilla Lame, MD;  Location: [facility] ENDOSCOPY;  Service: Endoscopy;  Laterality: N/A;   LAPAROTOMY N/A 03/14/2015   Procedure: EXPLORATORY LAPAROTOMY;  Surgeon: Molly Maduro, MD;  Location: [facility] ORS;  Service: General;  Laterality: N/A;   Patient Active Problem List   Diagnosis Date Noted   History of colonic polyps    Screen for colon cancer    Benign neoplasm of ascending colon    Polyp of sigmoid colon    Carotid artery calcification, bilateral 10/18/2016   At risk for falling 04/08/2016   H/O: HTN (hypertension) 04/08/2016   Hypo-ovarianism 04/08/2016   Asymptomatic varicose veins 04/08/2016   Avitaminosis D 04/08/2016   Hiatal hernia 02/09/2016   Liver cyst  02/09/2016   Lumbar spondylosis 02/09/2016   Osteoporosis 02/09/2016   Dyslipidemia 02/09/2016   Vegetarian 02/09/2016   Vitamin D deficiency 02/09/2016   Calcification of abdominal aorta ([facility]) 02/09/2016   History of small bowel obstruction 03/12/2015    REFERRING DIAG: M43.9 (ICD-10-CM) - Compression deformity of vertebra M54.50 (ICD-10-CM) - Acute bilateral low back pain without sciatica M81.0 (ICD-10-CM) - Age-related osteoporosis without current pathological fracture   THERAPY DIAG:  Acute left-sided low back pain without sciatica  Rationale for Evaluation and Treatment Rehabilitation  PERTINENT HISTORY: Pt reports being active and going to gym regularly. Pt had a colonoscopy on Septemebr 26th and she went to the gym and she moved a certain way where she felt a pulling in the left side of her low back. She began to hurt in left side of her low back pain the next day and she decided to stop working out for the next couple of days. She went to her PCP who gave her muscle relaxors which helped the pain. She has since gone to PCP who performed X-ray who found compression of vertebrae. The pain has since subsided since September 26th. She had this pain several years ago but pain went away after several days.   PRECAUTIONS: None  SUBJECTIVE: Pt reports decreased left hip pain.   PAIN:  Are you having pain? Yes: NPRS scale: 5/10 Pain location: Left  buttocks  Pain description: Achy  Aggravating factors: Standing for long period of time  Relieving factors: Not standing for a long period of time    OBJECTIVE:                 VITALS: BP 130/57 HR 90 SpO2 100   DIAGNOSTIC FINDINGS:    CLINICAL DATA:  Low back pain since colonoscopy on May 25, 2022.   EXAM: LUMBAR SPINE - COMPLETE 4+ VIEW   COMPARISON:  October 30, 2017   FINDINGS: 20% compression deformity L3 and L4 vertebral bodies are identified new compared to prior exam, age indeterminate. Decreased intervertebral  space at L5-S1 identified. Anterior spurring is identified in the lower thoracic spine. Extensive bowel content is identified throughout the colon. Scoliosis of spine noted.   IMPRESSION: 1. 20% compression deformity L3 and L4 vertebral bodies are identified new compared to prior exam, age indeterminate. 2. Extensive bowel content identified throughout the colon.     Electronically Signed   By: Abelardo Diesel M.D.   On: 06/09/2022 15:23   PATIENT SURVEYS:  FOTO Did not finish    COGNITION:           Overall cognitive status: Within functional limits for tasks assessed                          SENSATION: WFL     MUSCLE LENGTH: Hamstrings: Right 90 deg; Left 90 deg Thomas test: Negative bilateral                        Ely's test: Positive bilateral    POSTURE: No Significant postural limitations   PALPATION: Left glute          LOWER EXTREMITY ROM:       Lumbar ROM:                                               Flexion: 100%                                               Extension:100%                                               SB R/L: 100%/100%                                               Rot R/L: 100%/100%     Active  Right 07/12/2022 Left 07/12/2022  Hip flexion 120 120  Hip extension 30 30  Hip abduction 45 45  Hip adduction 30 30  Hip internal rotation 45 45  Hip external rotation 45 45  Knee flexion 135 135  Knee extension 0 0  Ankle dorsiflexion 20 20  Ankle plantarflexion 50 50  Ankle inversion      Ankle eversion       (Blank rows = not tested)  LOWER EXTREMITY MMT:   MMT Right eval Left eval  Hip flexion 5 5  Hip extension 4 4  Hip abduction 4 4  Hip adduction 4- 4-  Hip internal rotation      Hip external rotation      Knee flexion 5 5  Knee extension 5 5  Ankle dorsiflexion 5 5  Ankle plantarflexion 5 5  Ankle inversion      Ankle eversion       (Blank rows = not tested)   LOWER EXTREMITY SPECIAL TESTS:  Hip  special tests: Saralyn Pilar (FABER) test: negative and Ely's test: positive  FADIR: Negative bilateral    GAIT: Distance walked: 50 ft  Assistive device utilized: None Level of assistance: Complete Independence Comments: No abnormalities noted        TODAY'S TREATMENT:  07/28/22            Nu-Step Level 5 for arms and seat level with level 3 resistance for 5 min             Side Lying Hip Adduction 3 x 10                 SLR on RLE 1 x 10             SLR on RLE #5 AW 2 x 10             Abdominal Crunches 3 x 10             Side Lying Hip Abduction #3 AW 3 x 10              -posterior pelvic tilt for compenastion             OMEGA Leg Press #55 3 x 10   07/18/22:             Nu-Step Level 5 for arms and seat level with level 4 resistance for 5 min              Single Knee to Chest 6 x 30 sec             Side Lying Hip Abduction on RLE 1 X 10             Side Lying Hip Abduction on RLE with Red band  1 X 10             Side Lying Hip Abduction on RLE with Green band  2 X 10 with RLE and 3 x 10 with LLE              Mini-Squat with taps on 20 inch mat height 3 x 10                         -min VC for posterior weight shift and upright posture                           Education on delayed onset muscle soreness and timeline for recovery             Initial:   Prone Quad Stretch 3 x 30 sec  Seated Hip ER Stretch 3 x 30 sec        PATIENT EDUCATION:  Education details: form and technique for appropriate exercise and explanation about muscle pull and healing timeline  Person educated: Patient Education method: Customer service manager Education comprehension: verbalized understanding and returned  demonstration     HOME EXERCISE PROGRAM: Access Code: [email or web address] URL: [email or web address] Date: 07/28/2022 Prepared by: Bradly Chris  Exercises - Prone Quadriceps Stretch with Strap  - 1 x daily - 3 reps - 30-60 sec hold - Seated Hip External Rotation Stretch   - 1 x daily - 3 reps - 30-60 sec  hold - Supine Single Knee to Chest Stretch  - 1 x daily - 3 reps - 30-60 sec  hold - Sidelying Hip Abduction  - 3 x weekly - 3 sets - 10 reps - Mini Squat  - 3 x weekly - 3 sets - 10 reps - Sidelying Hip Adduction  - 3 x weekly - 3 sets - 10 reps - Full Leg Press  - 3 x weekly - 3 sets - 10 reps - Curl Up with Arms Crossed  - 3 x weekly - 3 sets - 10 reps   ASSESSMENT:   CLINICAL IMPRESSION:  Pt shows improvement with LE strength with ability to perform hip strengthening exercises with increased resistance.              She did show posterior pelvic tilt compensation during hip abduction which she corrected with min VC. She will continue to benefit from skilled PT to improve hip strength and to decrease left low back pain to return to regular exercise without experiencing excessive pain.     OBJECTIVE IMPAIRMENTS decreased strength, impaired flexibility, and pain.    ACTIVITY LIMITATIONS lifting, bending, and squatting   PARTICIPATION LIMITATIONS:  Exercise classes at gym    PERSONAL FACTORS Age are also affecting patient's functional outcome.    REHAB POTENTIAL: Excellent   CLINICAL DECISION MAKING: Stable/uncomplicated   EVALUATION COMPLEXITY: Low     GOALS: Goals reviewed with patient? No   SHORT TERM GOALS: Target date: 07/27/2022  Pt will be independent with HEP in order to improve strength and balance in order to decrease fall risk and improve function at home and work. Baseline:  Completing exercises independently  Goal status: Ongoing      LONG TERM GOALS: Target date: 09/21/2022    Patient will have improved function and activity level as evidenced by an increase in FOTO score by 10 points or more.  Baseline: Not completed  Goal status: Ongoing    2.  Patient will be educated on HEP for hip stretches as well as modalities and treatments for muscle strains as a way for her to independently treat future stiffness associated with  muscle strain  Baseline: Not complete Goal status: Ongoing    3.  Patient will improve hip strength for improved conditioning to avoid future muscle strains in low back and hip to continue pursuing recreational exercise injury free.  Baseline: Hip Abduction R/L 4/4, Hip Adduction R/L 4-/4-, Hip Extension R/L 4/4  Goal status: Ongoing      PLAN: PT FREQUENCY: 1x/week    PT DURATION: 5 weeks   PLANNED INTERVENTIONS: Therapeutic exercises, Neuromuscular re-education, Balance training, Gait training, Patient/Family education, Self Care, Joint mobilization, Joint manipulation, Stair training, Aquatic Therapy, Dry Needling, Spinal manipulation, Spinal mobilization, Cryotherapy, Moist heat, Manual therapy, and Re-evaluation   PLAN FOR NEXT SESSION: Complete FOTO. Educate patient on hip stretches and begin hip strengthening    Bradly Chris PT, DPT  07/28/2022, 3:55 PM

## 2022-08-02 ENCOUNTER — Ambulatory Visit: Payer: Medicare HMO | Admitting: Physical Therapy

## 2022-08-02 DIAGNOSIS — M545 Low back pain, unspecified: Secondary | ICD-10-CM

## 2022-08-02 NOTE — Therapy (Addendum)
OUTPATIENT PHYSICAL THERAPY TREATMENT NOTE/ Discharge Summary    Discharge Summary: Pt has reported back that she no longer needs physical therapy and she is ready to be discharged.   Patient Name: Paula Mills MRN: [id number] DOB:[birth year], 76 y.o., female Today's Date: 08/02/2022  PCP: Dr. Steele Sizer  REFERRING PROVIDER: FNP Serafina Royals   END OF SESSION:   PT End of Session - 08/02/22 1524     Visit Number 4    Number of Visits 5    Date for PT Re-Evaluation 08/17/22    Authorization Type [employer] Medicare    Authorization Time Period 07/12/22-08/17/22    PT Start Time 1330    PT Stop Time 1415    PT Time Calculation (min) 45 min    Activity Tolerance Patient tolerated treatment well    Behavior During Therapy WFL for tasks assessed/performed              Past Medical History:  Diagnosis Date   Hyperlipidemia    Vertigo    Past Surgical History:  Procedure Laterality Date   ABDOMINAL HYSTERECTOMY     APPENDECTOMY N/A 03/14/2015   Procedure: APPENDECTOMY;  Surgeon: Molly Maduro, MD;  Location: [facility] ORS;  Service: General;  Laterality: N/A;   CESAREAN SECTION     COLONOSCOPY WITH PROPOFOL N/A 01/31/2017   Procedure: COLONOSCOPY WITH PROPOFOL;  Surgeon: Lucilla Lame, MD;  Location: [facility] ENDOSCOPY;  Service: Endoscopy;  Laterality: N/A;   COLONOSCOPY WITH PROPOFOL N/A 05/24/2022   Procedure: COLONOSCOPY WITH PROPOFOL;  Surgeon: Lucilla Lame, MD;  Location: [facility] ENDOSCOPY;  Service: Endoscopy;  Laterality: N/A;   LAPAROTOMY N/A 03/14/2015   Procedure: EXPLORATORY LAPAROTOMY;  Surgeon: Molly Maduro, MD;  Location: [facility] ORS;  Service: General;  Laterality: N/A;   Patient Active Problem List   Diagnosis Date Noted   History of colonic polyps    Screen for colon cancer    Benign neoplasm of ascending colon    Polyp of sigmoid colon    Carotid artery calcification, bilateral 10/18/2016   At risk for falling 04/08/2016   H/O: HTN (hypertension) 04/08/2016    Hypo-ovarianism 04/08/2016   Asymptomatic varicose veins 04/08/2016   Avitaminosis D 04/08/2016   Hiatal hernia 02/09/2016   Liver cyst 02/09/2016   Lumbar spondylosis 02/09/2016   Osteoporosis 02/09/2016   Dyslipidemia 02/09/2016   Vegetarian 02/09/2016   Vitamin D deficiency 02/09/2016   Calcification of abdominal aorta ([place]) 02/09/2016   History of small bowel obstruction 03/12/2015    REFERRING DIAG: M43.9 (ICD-10-CM) - Compression deformity of vertebra M54.50 (ICD-10-CM) - Acute bilateral low back pain without sciatica M81.0 (ICD-10-CM) - Age-related osteoporosis without current pathological fracture   THERAPY DIAG:  Acute left-sided low back pain without sciatica  Rationale for Evaluation and Treatment Rehabilitation  PERTINENT HISTORY: Pt reports being active and going to gym regularly. Pt had a colonoscopy on Septemebr 26th and she went to the gym and she moved a certain way where she felt a pulling in the left side of her low back. She began to hurt in left side of her low back pain the next day and she decided to stop working out for the next couple of days. She went to her PCP who gave her muscle relaxors which helped the pain. She has since gone to PCP who performed X-ray who found compression of vertebrae. The pain has since subsided since September 26th. She had this pain several years ago but pain went away after several days.  PRECAUTIONS: None  SUBJECTIVE: Pt reports increased soreness from exercises last session.   PAIN:  Are you having pain? No  but she is having increased soreness    OBJECTIVE:                 VITALS: BP 130/57 HR 90 SpO2 100   DIAGNOSTIC FINDINGS:    CLINICAL DATA:  Low back pain since colonoscopy on May 25, 2022.   EXAM: LUMBAR SPINE - COMPLETE 4+ VIEW   COMPARISON:  October 30, 2017   FINDINGS: 20% compression deformity L3 and L4 vertebral bodies are identified new compared to prior exam, age indeterminate.  Decreased intervertebral space at L5-S1 identified. Anterior spurring is identified in the lower thoracic spine. Extensive bowel content is identified throughout the colon. Scoliosis of spine noted.   IMPRESSION: 1. 20% compression deformity L3 and L4 vertebral bodies are identified new compared to prior exam, age indeterminate. 2. Extensive bowel content identified throughout the colon.     Electronically Signed   By: Abelardo Diesel M.D.   On: 06/09/2022 15:23   PATIENT SURVEYS:  FOTO Did not finish    COGNITION:           Overall cognitive status: Within functional limits for tasks assessed                          SENSATION: WFL     MUSCLE LENGTH: Hamstrings: Right 90 deg; Left 90 deg Thomas test: Negative bilateral                        Ely's test: Positive bilateral    POSTURE: No Significant postural limitations   PALPATION: Left glute          LOWER EXTREMITY ROM:       Lumbar ROM:                                               Flexion: 100%                                               Extension:100%                                               SB R/L: 100%/100%                                               Rot R/L: 100%/100%     Active  Right 07/12/2022 Left 07/12/2022  Hip flexion 120 120  Hip extension 30 30  Hip abduction 45 45  Hip adduction 30 30  Hip internal rotation 45 45  Hip external rotation 45 45  Knee flexion 135 135  Knee extension 0 0  Ankle dorsiflexion 20 20  Ankle plantarflexion 50 50  Ankle inversion      Ankle eversion       (Blank rows = not tested)  LOWER EXTREMITY MMT:   MMT Right eval Left eval  Hip flexion 5 5  Hip extension 4 4  Hip abduction 4 4  Hip adduction 4- 4-  Hip internal rotation      Hip external rotation      Knee flexion 5 5  Knee extension 5 5  Ankle dorsiflexion 5 5  Ankle plantarflexion 5 5  Ankle inversion      Ankle eversion       (Blank rows = not tested)   LOWER EXTREMITY  SPECIAL TESTS:  Hip special tests: Saralyn Pilar (FABER) test: negative and Ely's test: positive  FADIR: Negative bilateral    GAIT: Distance walked: 50 ft  Assistive device utilized: None Level of assistance: Complete Independence Comments: No abnormalities noted        TODAY'S TREATMENT:  08/02/22           Nu-Step Level 5 for arms and seat level with level 1 resistance for 5 min           08/02/22 Hip Abduction R/L 4+/4+, Hip Adduction R/L 4-/4-, Hip Extension R/L 4+/4+                        Standing Hip Adduction in standing with #3 Ankle weight 3 x 10             Leg Lifts 5 x 10 sec holds             FOTO: 64/100 with target of 77              07/28/22            Nu-Step Level 5 for arms and seat level with level 3 resistance for 5 min             Side Lying Hip Adduction 3 x 10                 SLR on RLE 1 x 10             SLR on RLE #5 AW 2 x 10             Abdominal Crunches 3 x 10             Side Lying Hip Abduction #3 AW 3 x 10              -posterior pelvic tilt for compenastion             OMEGA Leg Press #55 3 x 10   07/18/22:             Nu-Step Level 5 for arms and seat level with level 4 resistance for 5 min              Single Knee to Chest 6 x 30 sec             Side Lying Hip Abduction on RLE 1 X 10             Side Lying Hip Abduction on RLE with Red band  1 X 10             Side Lying Hip Abduction on RLE with Green band  2 X 10 with RLE and 3 x 10 with LLE              Mini-Squat with taps on 20 inch mat height 3 x 10                         -  min VC for posterior weight shift and upright posture                           Education on delayed onset muscle soreness and timeline for recovery             Initial:   Prone Quad Stretch 3 x 30 sec  Seated Hip ER Stretch 3 x 30 sec        PATIENT EDUCATION:  Education details: form and technique for appropriate exercise and explanation about muscle pull and healing timeline  Person educated:  Patient Education method: Customer service manager Education comprehension: verbalized understanding and returned demonstration     HOME EXERCISE PROGRAM: Access Code: [email or web address] URL: [email or web address] Date: 08/02/2022 Prepared by: Bradly Chris  Exercises - Prone Quadriceps Stretch with Strap  - 1 x daily - 3 reps - 30-60 sec hold - Seated Hip External Rotation Stretch  - 1 x daily - 3 reps - 30-60 sec  hold - Supine Single Knee to Chest Stretch  - 1 x daily - 3 reps - 30-60 sec  hold - Sidelying Hip Abduction  - 3 x weekly - 3 sets - 10 reps - Mini Squat  - 3 x weekly - 3 sets - 10 reps - Full Leg Press  - 3 x weekly - 3 sets - 10 reps - Curl Up with Arms Crossed  - 3 x weekly - 3 sets - 10 reps - Supine Double Bent Leg Lift  - 3 x weekly - 5 reps - 10 sec hold - Standing Hip Adduction with Resistance  - 3 x weekly - 3 sets - 10 reps   ASSESSMENT:   CLINICAL IMPRESSION:  Despite pt not reaching all of her PT goals, she would like to attempt to follow HEP independently instead of coming to PT. She still has hip weakness and she feels that her lumbar function has decreased since coming to PT. This is likely due to delayed onset muscle soreness from last session, which she is still experiencing during session. She has scheduled another PT visit several weeks from now to check in about progress and see if she is able to progress with HEP.    OBJECTIVE IMPAIRMENTS decreased strength, impaired flexibility, and pain.    ACTIVITY LIMITATIONS lifting, bending, and squatting   PARTICIPATION LIMITATIONS:  Exercise classes at gym    PERSONAL FACTORS Age are also affecting patient's functional outcome.    REHAB POTENTIAL: Excellent   CLINICAL DECISION MAKING: Stable/uncomplicated   EVALUATION COMPLEXITY: Low     GOALS: Goals reviewed with patient? No   SHORT TERM GOALS: Target date: 07/27/2022  Pt will be independent with HEP in order to improve strength and  balance in order to decrease fall risk and improve function at home and work. Baseline:  Completing exercises independently  Goal status: Not Met      LONG TERM GOALS: Target date: 09/21/2022    Patient will have improved function and activity level as evidenced by an increase in FOTO score by 10 points or more.  Baseline: 69/100 08/02/22: 64 with target of 77 Goal status: Not Met    2.  Patient will be educated on HEP for hip stretches as well as modalities and treatments for muscle strains as a way for her to independently treat future stiffness associated with muscle strain  Baseline: Able to complete independently  Goal status: Achieved    3.  Patient  will improve hip strength for improved conditioning to avoid future muscle strains in low back and hip to continue pursuing recreational exercise injury free.  Baseline: Hip Abduction R/L 4/4, Hip Adduction R/L 4-/4-, Hip Extension R/L 4/4 08/02/22 Hip Abduction R/L 4+/4+, Hip Adduction R/L 4-/4-, Hip Extension R/L 4+/4+ Goal status: Not Met      PLAN: PT FREQUENCY: 1x/week    PT DURATION: 5 weeks   PLANNED INTERVENTIONS: Therapeutic exercises, Neuromuscular re-education, Balance training, Gait training, Patient/Family education, Self Care, Joint mobilization, Joint manipulation, Stair training, Aquatic Therapy, Dry Needling, Spinal manipulation, Spinal mobilization, Cryotherapy, Moist heat, Manual therapy, and Re-evaluation   PLAN FOR NEXT SESSION: Discharge from [facility], DPT  08/02/2022, 3:27 PM

## 2022-08-04 ENCOUNTER — Encounter: Payer: PRIVATE HEALTH INSURANCE | Admitting: Physical Therapy

## 2022-08-05 ENCOUNTER — Encounter: Payer: PRIVATE HEALTH INSURANCE | Admitting: Family Medicine

## 2022-08-10 ENCOUNTER — Ambulatory Visit: Payer: Medicare HMO | Attending: Nurse Practitioner | Admitting: Physical Therapy

## 2022-08-10 ENCOUNTER — Telehealth: Payer: Self-pay | Admitting: Physical Therapy

## 2022-08-10 NOTE — Telephone Encounter (Signed)
Called pt to inquire about absence. She feels that she has improved to the point where she no longer needs PT. PT will discharge pt from PT and send [place].

## 2022-08-12 NOTE — Patient Instructions (Incomplete)
Preventive Care 65 Years and Older, Female Preventive care refers to lifestyle choices and visits with your health care provider that can promote health and wellness. Preventive care visits are also called wellness exams. What can I expect for my preventive care visit? Counseling Your health care provider may ask you questions about your: Medical history, including: Past medical problems. Family medical history. Pregnancy and menstrual history. History of falls. Current health, including: Memory and ability to understand (cognition). Emotional well-being. Home life and relationship well-being. Sexual activity and sexual health. Lifestyle, including: Alcohol, nicotine or tobacco, and drug use. Access to firearms. Diet, exercise, and sleep habits. Work and work environment. Sunscreen use. Safety issues such as seatbelt and bike helmet use. Physical exam Your health care provider will check your: Height and weight. These may be used to calculate your BMI (body mass index). BMI is a measurement that tells if you are at a healthy weight. Waist circumference. This measures the distance around your waistline. This measurement also tells if you are at a healthy weight and may help predict your risk of certain diseases, such as type 2 diabetes and high blood pressure. Heart rate and blood pressure. Body temperature. Skin for abnormal spots. What immunizations do I need?  Vaccines are usually given at various ages, according to a schedule. Your health care provider will recommend vaccines for you based on your age, medical history, and lifestyle or other factors, such as travel or where you work. What tests do I need? Screening Your health care provider may recommend screening tests for certain conditions. This may include: Lipid and cholesterol levels. Hepatitis C test. Hepatitis B test. HIV (human immunodeficiency virus) test. STI (sexually transmitted infection) testing, if you are at  risk. Lung cancer screening. Colorectal cancer screening. Diabetes screening. This is done by checking your blood sugar (glucose) after you have not eaten for a while (fasting). Mammogram. Talk with your health care provider about how often you should have regular mammograms. BRCA-related cancer screening. This may be done if you have a family history of breast, ovarian, tubal, or peritoneal cancers. Bone density scan. This is done to screen for osteoporosis. Talk with your health care provider about your test results, treatment options, and if necessary, the need for more tests. Follow these instructions at home: Eating and drinking  Eat a diet that includes fresh fruits and vegetables, whole grains, lean protein, and low-fat dairy products. Limit your intake of foods with high amounts of sugar, saturated fats, and salt. Take vitamin and mineral supplements as recommended by your health care provider. Do not drink alcohol if your health care provider tells you not to drink. If you drink alcohol: Limit how much you have to 0-1 drink a day. Know how much alcohol is in your drink. In the [place] one drink equals one 12 oz bottle of beer (355 mL), one 5 oz glass of wine (148 mL), or one 1 oz glass of hard liquor (44 mL). Lifestyle Brush your teeth every morning and night with fluoride toothpaste. Floss one time each day. Exercise for at least 30 minutes 5 or more days each week. Do not use any products that contain nicotine or tobacco. These products include cigarettes, chewing tobacco, and vaping devices, such as e-cigarettes. If you need help quitting, ask your health care provider. Do not use drugs. If you are sexually active, practice safe sex. Use a condom or other form of protection in order to prevent STIs. Take aspirin only as told by   your health care provider. Make sure that you understand how much to take and what form to take. Work with your health care provider to find out whether it  is safe and beneficial for you to take aspirin daily. Ask your health care provider if you need to take a cholesterol-lowering medicine (statin). Find healthy ways to manage stress, such as: Meditation, yoga, or listening to music. Journaling. Talking to a trusted person. Spending time with friends and family. Minimize exposure to UV radiation to reduce your risk of skin cancer. Safety Always wear your seat belt while driving or riding in a vehicle. Do not drive: If you have been drinking alcohol. Do not ride with someone who has been drinking. When you are tired or distracted. While texting. If you have been using any mind-altering substances or drugs. Wear a helmet and other protective equipment during sports activities. If you have firearms in your house, make sure you follow all gun safety procedures. What's next? Visit your health care provider once a year for an annual wellness visit. Ask your health care provider how often you should have your eyes and teeth checked. Stay up to date on all vaccines. This information is not intended to replace advice given to you by your health care provider. Make sure you discuss any questions you have with your health care provider. Document Revised: 03/17/2021 Document Reviewed: 03/17/2021 Elsevier Patient Education  2023 Elsevier Inc.  

## 2022-08-12 NOTE — Progress Notes (Unsigned)
Name: Paula Mills   MRN: [id number]    DOB: Nov 02, 1945   Date:08/15/2022       Progress Note  Subjective  Chief Complaint  Annual Exam  HPI  Patient presents for annual CPE.  Diet: vegetarian diet  Exercise:  continue physical activity  Last Eye Exam: up to date  Last Dental Exam: up to date   [place] Visit from 06/08/2022 in [facility]  AUDIT-C Score 0      Depression: Phq 9 is  negative    08/15/2022    2:40 PM 06/08/2022    1:23 PM 06/01/2022    8:22 AM 04/28/2022    8:53 AM 04/28/2022    8:46 AM  Depression screen PHQ 2/9  Decreased Interest 0 0 1 0 0  Down, Depressed, Hopeless 0 0 0 0 0  PHQ - 2 Score 0 0 1 0 0  Altered sleeping 0   0   Tired, decreased energy 0   0   Change in appetite 0   0   Feeling bad or failure about yourself  0   0   Trouble concentrating 0   0   Moving slowly or fidgety/restless 0   0   Suicidal thoughts 0   0   PHQ-9 Score 0   0    Hypertension: BP Readings from Last 3 Encounters:  08/15/22 116/72  06/08/22 122/74  06/01/22 124/80   Obesity: Wt Readings from Last 3 Encounters:  08/15/22 162 lb (73.5 kg)  06/08/22 159 lb 9.6 oz (72.4 kg)  06/01/22 158 lb 4.8 oz (71.8 kg)   BMI Readings from Last 3 Encounters:  08/15/22 29.63 kg/m  06/08/22 29.19 kg/m  06/01/22 28.95 kg/m     Vaccines:   RSV: discussed with patient  Tdap: 2008, due Shingrix: refused  Pneumonia: up to date Flu: 2014, due but refused  COVID-19: discussed booster    Hep C Screening: 02/19/13 STD testing and prevention (HIV/chl/gon/syphilis): N/A Intimate partner violence: negative screen  Sexual History : one partner  Menstrual History/LMP/Abnormal Bleeding: s/p hysterectomy  Discussed importance of follow up if any post-menopausal bleeding: not applicable  Incontinence Symptoms: negative for symptoms   Breast cancer:  - Last Mammogram: 12/13/21 - BRCA gene screening: N/A  Osteoporosis Prevention : Discussed  high calcium and vitamin D supplementation, weight bearing exercises Bone density: 12/13/21   Cervical cancer screening: N/A  Skin cancer: Discussed monitoring for atypical lesions  Colorectal cancer: 05/24/22   Lung cancer:  Low Dose CT Chest recommended if Age 26-80 years, 20 pack-year currently smoking OR have quit w/in 15years. Patient does not qualify for screen   ECG: 04/10/17  Advanced Care Planning: A voluntary discussion about advance care planning including the explanation and discussion of advance directives.  Discussed health care proxy and Living will, and the patient was able to identify a health care proxy as husband .  Patient has  a living will and power of attorney of health care   Lipids: Lab Results  Component Value Date   CHOL 298 (H) 08/02/2021   CHOL 302 (H) 07/29/2020   CHOL 297 (H) 03/29/2019   Lab Results  Component Value Date   HDL 72 08/02/2021   HDL 79 07/29/2020   HDL 74 03/29/2019   Lab Results  Component Value Date   LDLCALC 204 (H) 08/02/2021   LDLCALC 200 (H) 07/29/2020   LDLCALC 205 (H) 03/29/2019   Lab Results  Component Value Date  TRIG 95 08/02/2021   TRIG 99 07/29/2020   TRIG 70 03/29/2019   Lab Results  Component Value Date   CHOLHDL 4.1 08/02/2021   CHOLHDL 3.8 07/29/2020   CHOLHDL 4.0 03/29/2019   No results found for: "LDLDIRECT"  Glucose: Glucose, Bld  Date Value Ref Range Status  08/02/2021 82 65 - 99 mg/dL Final    Comment:    .            Fasting reference interval .   07/29/2020 84 65 - 99 mg/dL Final    Comment:    .            Fasting reference interval .   03/29/2019 77 65 - 99 mg/dL Final    Comment:    .            Fasting reference interval .    Glucose-Capillary  Date Value Ref Range Status  03/15/2015 151 (H) 65 - 99 mg/dL Final  03/14/2015 157 (H) 65 - 99 mg/dL Final    Patient Active Problem List   Diagnosis Date Noted   History of colonic polyps    Screen for colon cancer    Benign  neoplasm of ascending colon    Polyp of sigmoid colon    Carotid artery calcification, bilateral 10/18/2016   At risk for falling 04/08/2016   H/O: HTN (hypertension) 04/08/2016   Hypo-ovarianism 04/08/2016   Asymptomatic varicose veins 04/08/2016   Avitaminosis D 04/08/2016   Hiatal hernia 02/09/2016   Liver cyst 02/09/2016   Lumbar spondylosis 02/09/2016   Osteoporosis 02/09/2016   Dyslipidemia 02/09/2016   Vegetarian 02/09/2016   Vitamin D deficiency 02/09/2016   Calcification of abdominal aorta ([facility]) 02/09/2016   History of small bowel obstruction 03/12/2015    Past Surgical History:  Procedure Laterality Date   ABDOMINAL HYSTERECTOMY     APPENDECTOMY N/A 03/14/2015   Procedure: APPENDECTOMY;  Surgeon: Molly Maduro, MD;  Location: [facility] ORS;  Service: General;  Laterality: N/A;   CESAREAN SECTION     COLONOSCOPY WITH PROPOFOL N/A 01/31/2017   Procedure: COLONOSCOPY WITH PROPOFOL;  Surgeon: Lucilla Lame, MD;  Location: [facility] ENDOSCOPY;  Service: Endoscopy;  Laterality: N/A;   COLONOSCOPY WITH PROPOFOL N/A 05/24/2022   Procedure: COLONOSCOPY WITH PROPOFOL;  Surgeon: Lucilla Lame, MD;  Location: [facility] ENDOSCOPY;  Service: Endoscopy;  Laterality: N/A;   LAPAROTOMY N/A 03/14/2015   Procedure: EXPLORATORY LAPAROTOMY;  Surgeon: Molly Maduro, MD;  Location: [facility] ORS;  Service: General;  Laterality: N/A;    Family History  Problem Relation Age of Onset   Hyperlipidemia Mother    Hypertension Mother    Dementia Mother    Multiple sclerosis Father    Hypertension Father    Hypertension Sister    Hyperlipidemia Sister    Multiple sclerosis Sister     Social History   Socioeconomic History   Marital status: Married    Spouse name: Winferd Humphrey   Number of children: 2   Years of education: Not on file   Highest education level: Master's degree (e.g., MA, MS, MEng, MEd, MSW, MBA)  Occupational History   Occupation: Retired  Tobacco Use   Smoking status: Never   Smokeless  tobacco: Never  Vaping Use   Vaping Use: Never used  Substance and Sexual Activity   Alcohol use: Not Currently    Alcohol/week: 0.0 standard drinks of alcohol   Drug use: No   Sexual activity: Not Currently    Partners: Male  Other Topics Concern  Not on file  Social History Narrative   Husband has a pacemaker    Social Determinants of Health   Financial Resource Strain: Low Risk  (08/15/2022)   Overall Financial Resource Strain (CARDIA)    Difficulty of Paying Living Expenses: Not hard at all  Food Insecurity: No Food Insecurity (08/15/2022)   Hunger Vital Sign    Worried About Running Out of Food in the Last Year: Never true    Ran Out of Food in the Last Year: Never true  Transportation Needs: No Transportation Needs (08/15/2022)   PRAPARE - Hydrologist (Medical): No    Lack of Transportation (Non-Medical): No  Physical Activity: Sufficiently Active (08/15/2022)   Exercise Vital Sign    Days of Exercise per Week: 5 days    Minutes of Exercise per Session: 40 min  Stress: No Stress Concern Present (08/15/2022)   [place]    Feeling of Stress : Not at all  Social Connections: [place] (08/15/2022)   Social Connection and Isolation Panel [NHANES]    Frequency of Communication with Friends and Family: More than three times a week    Frequency of Social Gatherings with Friends and Family: More than three times a week    Attends Religious Services: More than 4 times per year    Active Member of [employer] or Organizations: Yes    Attends Music therapist: More than 4 times per year    Marital Status: Married  Human resources officer Violence: Not At Risk (08/15/2022)   Humiliation, Afraid, Rape, and Kick questionnaire    Fear of Current or Ex-Partner: No    Emotionally Abused: No    Physically Abused: No    Sexually Abused: No     Current Outpatient  Medications:    Cholecalciferol (VITAMIN D) 2000 units CAPS, Take 2,000 capsules by mouth daily. , Disp: , Rfl:    COD LIVER OIL PO, Take by mouth., Disp: , Rfl:    lidocaine (LIDODERM) 5 %, Place 1 patch onto the skin daily. Remove & Discard patch within 12 hours or as directed by MD, Disp: 30 patch, Rfl: 0   Multiple Vitamins-Minerals (WOMENS MULTIVITAMIN PO), Take 1 capsule by mouth daily. , Disp: , Rfl:    naproxen (NAPROSYN) 500 MG tablet, Take 1 tablet (500 mg total) by mouth 2 (two) times daily with a meal., Disp: 30 tablet, Rfl: 0   tiZANidine (ZANAFLEX) 4 MG tablet, Take 1 tablet (4 mg total) by mouth every 8 (eight) hours as needed for muscle spasms (muscle tightness)., Disp: 90 tablet, Rfl: 2   vitamin B-12 (CYANOCOBALAMIN) 500 MCG tablet, Take 1,000 mcg by mouth daily., Disp: , Rfl:   No Known Allergies   ROS  Constitutional: Negative for fever or weight change.  Respiratory: Negative for cough and shortness of breath.   Cardiovascular: Negative for chest pain or palpitations.  Gastrointestinal: Negative for abdominal pain, no bowel changes.  Musculoskeletal: Negative for gait problem or joint swelling.  Skin: Negative for rash.  Neurological: Negative for dizziness or headache.  No other specific complaints in a complete review of systems (except as listed in HPI above).   Objective  Vitals:   08/15/22 1441  BP: 116/72  Pulse: 88  Resp: 16  SpO2: 96%  Weight: 162 lb (73.5 kg)  Height: _0  (1.575 m)    Body mass index is 29.63 kg/m.  Physical Exam  Constitutional: Patient  appears well-developed and well-nourished. No distress.  HENT: Head: Normocephalic and atraumatic. Ears: B TMs ok, no erythema or effusion; Nose: Nose normal. Mouth/Throat: Oropharynx is clear and moist. No oropharyngeal exudate.  Eyes: Conjunctivae and EOM are normal. Pupils are equal, round, and reactive to light. No scleral icterus.  Neck: Normal range of motion. Neck supple. No JVD  present. No thyromegaly present.  Cardiovascular: Normal rate, regular rhythm and normal heart sounds.  No murmur heard. No BLE edema. Pulmonary/Chest: Effort normal and breath sounds normal. No respiratory distress. Abdominal: Soft. Bowel sounds are normal, no distension. There is no tenderness. no masses Breast: no lumps or masses, no nipple discharge or rashes FEMALE GENITALIA:  Not done  RECTAL: not done  Musculoskeletal: Normal range of motion, no joint effusions. No gross deformities Neurological: he is alert and oriented to person, place, and time. No cranial nerve deficit. Coordination, balance, strength, speech and gait are normal.  Skin: Skin is warm and dry. No rash noted. No erythema.  Psychiatric: Patient has a normal mood and affect. behavior is normal. Judgment and thought content normal.   Recent Results (from the past 2160 hour(s))  Novel Coronavirus, NAA ([facility])     Status: None   Collection Time: 06/08/22  6:37 PM   Specimen: Nasopharyngeal(NP) swabs in vial transport medium   Nasopharynge  Previous  Result Value Ref Range   SARS-CoV-2, NAA Not Detected Not Detected    Comment: This nucleic acid amplification test was developed and its performance characteristics determined by [employer]. Nucleic acid amplification tests include RT-PCR and TMA. This test has not been FDA cleared or approved. This test has been authorized by FDA under an Emergency Use Authorization (EUA). This test is only authorized for the duration of time the declaration that circumstances exist justifying the authorization of the emergency use of in vitro diagnostic tests for detection of SARS-CoV-2 virus and/or diagnosis of COVID-19 infection under section 564(b)(1) of the Act, 21 U.S.C. 765YYT-0(P) (1), unless the authorization is terminated or revoked sooner. When diagnostic testing is negative, the possibility of a false negative result should be considered in the context of a  patient's recent exposures and the presence of clinical signs and symptoms consistent with COVID-19. An individual without symptoms of COVID-19 and who is not shedding SARS-CoV-2 virus wo uld expect to have a negative (not detected) result in this assay.      Fall Risk:    08/15/2022    2:40 PM 06/08/2022    1:23 PM 06/01/2022    8:21 AM 04/28/2022    8:53 AM 02/01/2022    2:35 PM  Fall Risk   Falls in the past year? 0 0 0 0 0  Number falls in past yr: 0 0 0    Injury with Fall? 0 0 0    Risk for fall due to : No Fall Risks   No Fall Risks   Follow up Falls prevention discussed Falls evaluation completed Falls evaluation completed Falls prevention discussed Falls prevention discussed     Functional Status Survey: Is the patient deaf or have difficulty hearing?: No Does the patient have difficulty seeing, even when wearing glasses/contacts?: No Does the patient have difficulty concentrating, remembering, or making decisions?: No Does the patient have difficulty walking or climbing stairs?: No Does the patient have difficulty dressing or bathing?: No Does the patient have difficulty doing errands alone such as visiting a doctor's office or shopping?: No   Assessment & Plan  1. Well  adult exam   She will check about whooping cough coverage at local pharmacy, she is going to [place] in Dec   - Lipid panel - VITAMIN D 25 Hydroxy (Vit-D Deficiency, Fractures) - Hemoglobin A1c - COMPLETE METABOLIC PANEL WITH GFR - CBC with Differential/Platelet - Vitamin B12  2. Vegetarian  - Vitamin B12  3. Dyslipidemia  - Lipid panel  4. PAD (peripheral artery disease) (HCC)  - Lipid panel - COMPLETE METABOLIC PANEL WITH GFR - CBC with Differential/Platelet  5. Vitamin D deficiency  - VITAMIN D 25 Hydroxy (Vit-D Deficiency, Fractures)  6. Osteoporosis without current pathological fracture, unspecified osteoporosis type  - COMPLETE METABOLIC PANEL WITH GFR - CBC with  Differential/Platelet   -USPSTF grade A and B recommendations reviewed with patient; age-appropriate recommendations, preventive care, screening tests, etc discussed and encouraged; healthy living encouraged; see AVS for patient education given to patient -Discussed importance of 150 minutes of physical activity weekly, eat two servings of fish weekly, eat one serving of tree nuts ( cashews, pistachios, pecans, almonds.Marland Kitchen) every other day, eat 6 servings of fruit/vegetables daily and drink plenty of water and avoid sweet beverages.   -Reviewed Health Maintenance: Yes.

## 2022-08-15 ENCOUNTER — Encounter: Payer: Self-pay | Admitting: Family Medicine

## 2022-08-15 ENCOUNTER — Ambulatory Visit (INDEPENDENT_AMBULATORY_CARE_PROVIDER_SITE_OTHER): Payer: PRIVATE HEALTH INSURANCE | Admitting: Family Medicine

## 2022-08-15 VITALS — BP 116/72 | HR 88 | Resp 16 | Ht 62.0 in | Wt 162.0 lb

## 2022-08-15 DIAGNOSIS — M810 Age-related osteoporosis without current pathological fracture: Secondary | ICD-10-CM

## 2022-08-15 DIAGNOSIS — E785 Hyperlipidemia, unspecified: Secondary | ICD-10-CM

## 2022-08-15 DIAGNOSIS — Z0000 Encounter for general adult medical examination without abnormal findings: Secondary | ICD-10-CM

## 2022-08-15 DIAGNOSIS — I739 Peripheral vascular disease, unspecified: Secondary | ICD-10-CM | POA: Diagnosis not present

## 2022-08-15 DIAGNOSIS — Z789 Other specified health status: Secondary | ICD-10-CM | POA: Diagnosis not present

## 2022-08-15 DIAGNOSIS — E559 Vitamin D deficiency, unspecified: Secondary | ICD-10-CM

## 2022-08-16 ENCOUNTER — Encounter: Payer: PRIVATE HEALTH INSURANCE | Admitting: Physical Therapy

## 2022-08-16 LAB — COMPLETE METABOLIC PANEL WITH GFR
AG Ratio: 1.1 (calc) (ref 1.0–2.5)
ALT: 16 U/L (ref 6–29)
AST: 18 U/L (ref 10–35)
Albumin: 4 g/dL (ref 3.6–5.1)
Alkaline phosphatase (APISO): 80 U/L (ref 37–153)
BUN: 13 mg/dL (ref 7–25)
CO2: 27 mmol/L (ref 20–32)
Calcium: 9.4 mg/dL (ref 8.6–10.4)
Chloride: 102 mmol/L (ref 98–110)
Creat: 0.68 mg/dL (ref 0.60–1.00)
Globulin: 3.5 g/dL (calc) (ref 1.9–3.7)
Glucose, Bld: 88 mg/dL (ref 65–99)
Potassium: 4.2 mmol/L (ref 3.5–5.3)
Sodium: 138 mmol/L (ref 135–146)
Total Bilirubin: 0.4 mg/dL (ref 0.2–1.2)
Total Protein: 7.5 g/dL (ref 6.1–8.1)
eGFR: 90 mL/min/{1.73_m2} (ref >=60)

## 2022-08-16 LAB — CBC WITH DIFFERENTIAL/PLATELET
Absolute Monocytes: 317 cells/uL (ref 200–950)
Basophils Absolute: 21 cells/uL (ref 0–200)
Basophils Relative: 0.4 %
Eosinophils Absolute: 88 cells/uL (ref 15–500)
Eosinophils Relative: 1.7 %
HCT: 35.5 % (ref 35.0–45.0)
Hemoglobin: 11.8 g/dL (ref 11.7–15.5)
Lymphs Abs: 1503 cells/uL (ref 850–3900)
MCH: 27.8 pg (ref 27.0–33.0)
MCHC: 33.2 g/dL (ref 32.0–36.0)
MCV: 83.7 fL (ref 80.0–100.0)
MPV: 11.6 fL (ref 7.5–12.5)
Monocytes Relative: 6.1 %
Neutro Abs: 3271 cells/uL (ref 1500–7800)
Neutrophils Relative %: 62.9 %
Platelets: 257 10*3/uL (ref 140–400)
RBC: 4.24 10*6/uL (ref 3.80–5.10)
RDW: 13.7 % (ref 11.0–15.0)
Total Lymphocyte: 28.9 %
WBC: 5.2 10*3/uL (ref 3.8–10.8)

## 2022-08-16 LAB — LIPID PANEL
Cholesterol: 328 mg/dL — ABNORMAL HIGH (ref <200)
HDL: 73 mg/dL (ref >=50)
LDL Cholesterol (Calc): 229 mg/dL (calc) — ABNORMAL HIGH
Non-HDL Cholesterol (Calc): 255 mg/dL (calc) — ABNORMAL HIGH (ref <130)
Total CHOL/HDL Ratio: 4.5 (calc) (ref <5.0)
Triglycerides: 119 mg/dL (ref <150)

## 2022-08-16 LAB — HEMOGLOBIN A1C
Hgb A1c MFr Bld: 6 % of total Hgb — ABNORMAL HIGH (ref <5.7)
Mean Plasma Glucose: 126 mg/dL
eAG (mmol/L): 7 mmol/L

## 2022-08-16 LAB — VITAMIN B12: Vitamin B-12: 668 pg/mL (ref 200–1100)

## 2022-08-16 LAB — VITAMIN D 25 HYDROXY (VIT D DEFICIENCY, FRACTURES): Vit D, 25-Hydroxy: 45 ng/mL (ref 30–100)

## 2022-08-23 ENCOUNTER — Other Ambulatory Visit: Payer: Self-pay

## 2022-10-18 ENCOUNTER — Ambulatory Visit
Admission: RE | Admit: 2022-10-18 | Discharge: 2022-10-18 | Disposition: A | Discharge status: Home / Self Care | Payer: Medicare HMO | Source: Ambulatory Visit | Attending: Physician Assistant | Admitting: Physician Assistant

## 2022-10-18 ENCOUNTER — Ambulatory Visit
Admission: RE | Admit: 2022-10-18 | Discharge: 2022-10-18 | Disposition: A | Discharge status: Home / Self Care | Payer: Medicare HMO | Attending: Physician Assistant | Admitting: Physician Assistant

## 2022-10-18 ENCOUNTER — Encounter: Payer: Self-pay | Admitting: Physician Assistant

## 2022-10-18 ENCOUNTER — Ambulatory Visit (INDEPENDENT_AMBULATORY_CARE_PROVIDER_SITE_OTHER): Payer: PRIVATE HEALTH INSURANCE | Admitting: Physician Assistant

## 2022-10-18 ENCOUNTER — Ambulatory Visit: Payer: Self-pay

## 2022-10-18 VITALS — BP 144/82 | HR 97 | Temp 97.9°F | Resp 16 | Ht 62.0 in | Wt 158.2 lb

## 2022-10-18 DIAGNOSIS — M25551 Pain in right hip: Secondary | ICD-10-CM | POA: Diagnosis present

## 2022-10-18 DIAGNOSIS — M25552 Pain in left hip: Secondary | ICD-10-CM | POA: Insufficient documentation

## 2022-10-18 DIAGNOSIS — M79661 Pain in right lower leg: Secondary | ICD-10-CM

## 2022-10-18 NOTE — Telephone Encounter (Signed)
  Chief Complaint: Hip pain - Right calf pain Symptoms: leg and calf pain Frequency: After returning from [place] Pertinent Negatives: Patient denies redness Disposition: '[x]'$ ED /'[x]'$ Urgent Care (no appt availability in office) / '[]'$ Appointment(In office/virtual)/ '[]'$  [facility]/ '[]'$ Home Care/ '[]'$ Refused Recommended Disposition /'[]'$ [facility]/ '[]'$  Follow-up with PCP Additional Notes: Pt called regarding ongoing hip pain. Pt mentioned calf pain - right leg that started after her return from [place]. Pt returned to [place] before the end of the year. Pt reports no redness or swelling. Pt has no Hx of clots, however pt was on a very long plane ride with little opportunity to ambulate.   Summary: Hip/Leg Pain   Patient states that she has been having hip/leg pain. Patient is scheduled to see pcp on 1/17, but wants to know what she can take other than tylenol for the pain.     Reason for Disposition  [1] Thigh or calf pain AND [2] only 1 side AND [3] present > 1 hour (Exception: Chronic unchanged pain.)  Answer Assessment - Initial Assessment Questions 1. ONSET: "When did the pain start?"      Right calf - after she returned 2. LOCATION: "Where is the pain located?"      Right calf 3. PAIN: "How bad is the pain?"    (Scale 1-10; or mild, moderate, severe)   -  MILD (1-3): doesn't interfere with normal activities    -  MODERATE (4-7): interferes with normal activities (e.g., work or school) or awakens from sleep, limping    -  SEVERE (8-10): excruciating pain, unable to do any normal activities, unable to walk     6-7/10 4. WORK OR EXERCISE: "Has there been any recent work or exercise that involved this part of the body?"      no 5. CAUSE: "What do you think is causing the leg pain?"     Unsure 6. OTHER SYMPTOMS: "Do you have any other symptoms?" (e.g., chest pain, back pain, breathing difficulty, swelling, rash, fever, numbness, weakness)     no 7. PREGNANCY: "Is there any chance  you are pregnant?" "When was your last menstrual period?"     na  Protocols used: Leg Pain-A-AH

## 2022-10-18 NOTE — Patient Instructions (Signed)
I'd recommend taking Tylenol and Ibuprofen for the pain in your hips and leg  You can try Voltaren gel, Lidocaine patches, warm compresses for the calf pain as well.  We will keep you updated with the results of your xray as they become available  Please go here for your  xray   [place] [address] [place], [place] [address]

## 2022-10-18 NOTE — Progress Notes (Deleted)
Name: Paula Mills   MRN: [id number]    DOB: May 01, 1946   Date:10/18/2022       Progress Note  Subjective  Chief Complaint  Hip/ Leg Pain  HPI  *** Patient Active Problem List   Diagnosis Date Noted   History of colonic polyps    Screen for colon cancer    Benign neoplasm of ascending colon    Polyp of sigmoid colon    Carotid artery calcification, bilateral 10/18/2016   At risk for falling 04/08/2016   H/O: HTN (hypertension) 04/08/2016   Hypo-ovarianism 04/08/2016   Asymptomatic varicose veins 04/08/2016   Avitaminosis D 04/08/2016   Hiatal hernia 02/09/2016   Liver cyst 02/09/2016   Lumbar spondylosis 02/09/2016   Osteoporosis 02/09/2016   Dyslipidemia 02/09/2016   Vegetarian 02/09/2016   Vitamin D deficiency 02/09/2016   Calcification of abdominal aorta ([facility]) 02/09/2016   History of small bowel obstruction 03/12/2015    Past Surgical History:  Procedure Laterality Date   ABDOMINAL HYSTERECTOMY     APPENDECTOMY N/A 03/14/2015   Procedure: APPENDECTOMY;  Surgeon: Molly Maduro, MD;  Location: [facility] ORS;  Service: General;  Laterality: N/A;   CESAREAN SECTION     COLONOSCOPY WITH PROPOFOL N/A 01/31/2017   Procedure: COLONOSCOPY WITH PROPOFOL;  Surgeon: Lucilla Lame, MD;  Location: [facility] ENDOSCOPY;  Service: Endoscopy;  Laterality: N/A;   COLONOSCOPY WITH PROPOFOL N/A 05/24/2022   Procedure: COLONOSCOPY WITH PROPOFOL;  Surgeon: Lucilla Lame, MD;  Location: [facility] ENDOSCOPY;  Service: Endoscopy;  Laterality: N/A;   LAPAROTOMY N/A 03/14/2015   Procedure: EXPLORATORY LAPAROTOMY;  Surgeon: Molly Maduro, MD;  Location: [facility] ORS;  Service: General;  Laterality: N/A;    Family History  Problem Relation Age of Onset   Hyperlipidemia Mother    Hypertension Mother    Dementia Mother    Multiple sclerosis Father    Hypertension Father    Hypertension Sister    Hyperlipidemia Sister    Multiple sclerosis Sister     Social History   Tobacco Use   Smoking status: Never    Smokeless tobacco: Never  Substance Use Topics   Alcohol use: Not Currently    Alcohol/week: 0.0 standard drinks of alcohol     Current Outpatient Medications:    Cholecalciferol (VITAMIN D) 2000 units CAPS, Take 2,000 capsules by mouth daily. , Disp: , Rfl:    COD LIVER OIL PO, Take by mouth., Disp: , Rfl:    lidocaine (LIDODERM) 5 %, Place 1 patch onto the skin daily. Remove & Discard patch within 12 hours or as directed by MD, Disp: 30 patch, Rfl: 0   Multiple Vitamins-Minerals (WOMENS MULTIVITAMIN PO), Take 1 capsule by mouth daily. , Disp: , Rfl:    naproxen (NAPROSYN) 500 MG tablet, Take 1 tablet (500 mg total) by mouth 2 (two) times daily with a meal., Disp: 30 tablet, Rfl: 0   tiZANidine (ZANAFLEX) 4 MG tablet, Take 1 tablet (4 mg total) by mouth every 8 (eight) hours as needed for muscle spasms (muscle tightness)., Disp: 90 tablet, Rfl: 2   vitamin B-12 (CYANOCOBALAMIN) 500 MCG tablet, Take 1,000 mcg by mouth daily., Disp: , Rfl:   No Known Allergies  I personally reviewed active problem list, medication list, allergies, family history, social history, health maintenance with the patient/caregiver today.   ROS  ***  Objective  There were no vitals filed for this visit.  There is no height or weight on file to calculate BMI.  Physical Exam ***  Recent  Results (from the past 2160 hour(s))  Lipid panel     Status: Abnormal   Collection Time: 08/15/22  3:35 PM  Result Value Ref Range   Cholesterol 328 (H) <200 mg/dL   HDL 73 > OR = 50 mg/dL   Triglycerides 119 <150 mg/dL   LDL Cholesterol (Calc) 229 (H) mg/dL (calc)    Comment: LDL-C levels > or = 190 mg/dL may indicate familial  hypercholesterolemia (FH). Clinical assessment and  measurement of blood lipid levels should be  considered for all first degree relatives of patients with an FH diagnosis. LDL Cholesterol (LDL-C) levels > or = 300 mg/dL may indicate homozygous familial hypercholesterolemia (HoFH).  Untreated,  these extremely high LDL-C levels can result in premature CV events and mortality. Patients should be identified early and provided appropriate interventions to reduce the cumulative LDL-C burden from birth. . For questions about testing for familial hypercholesterolemia, please call Insurance risk surveyor at 1.866.GENE.INFO. Duncan Dull, et al. J National Lipid Association  Recommendations for Patient-Centered Management of Dyslipidemia: Part 1 Journal of  Clinical Lipidology 2015;9(2), 129-169. Cuchel, M. et al. (2014). Homozygous familial hypercholesterolaemia: new insights and guidance for clinicians to impro ve detection and clinical management. European Heart Journal, 35(32), [phone]. Reference range: <100 . Desirable range <100 mg/dL for primary prevention;   <70 mg/dL for patients with CHD or diabetic patients  with > or = 2 CHD risk factors. Marland Kitchen LDL-C is now calculated using the Martin-Hopkins  calculation, which is a validated novel method providing  better accuracy than the Friedewald equation in the  estimation of LDL-C.  Cresenciano Genre et al. Annamaria Helling. [id number]): 2061-2068  (http://education.[facility].com/faq/FAQ164)    Total CHOL/HDL Ratio 4.5 <5.0 (calc)   Non-HDL Cholesterol (Calc) 255 (H) <130 mg/dL (calc)    Comment: Non-HDL level > or = 220 is very high and may indicate  genetic familial hypercholesterolemia (FH). Clinical  assessment and measurement of blood lipid levels  should be considered for all first-degree relatives  of patients with an FH diagnosis. . For patients with diabetes plus 1 major ASCVD risk  factor, treating to a non-HDL-C goal of <100 mg/dL  (LDL-C of <70 mg/dL) is considered a therapeutic  option.   VITAMIN D 25 Hydroxy (Vit-D Deficiency, Fractures)     Status: None   Collection Time: 08/15/22  3:35 PM  Result Value Ref Range   Vit D, 25-Hydroxy 45 30 - 100 ng/mL    Comment: Vitamin D Status         25-OH  Vitamin D: . Deficiency:                    <20 ng/mL Insufficiency:             20 - 29 ng/mL Optimal:                 > or = 30 ng/mL . For 25-OH Vitamin D testing on patients on  D2-supplementation and patients for whom quantitation  of D2 and D3 fractions is required, the [facility]ure[facility](TM) 25-OH VIT D, (D2,D3), LC/MS/MS is recommended: order  code [phone] (patients >4yr). . See Note 1 . Note 1 . For additional information, please refer to  http://education.[facility].com/faq/FAQ199  (This link is being provided for informational/ educational purposes only.)   Hemoglobin A1c     Status: Abnormal   Collection Time: 08/15/22  3:35 PM  Result Value Ref Range   Hgb A1c MFr Bld 6.0 (H) <5.7 % of  total Hgb    Comment: For someone without known diabetes, a hemoglobin  A1c value between 5.7% and 6.4% is consistent with prediabetes and should be confirmed with a  follow-up test. . For someone with known diabetes, a value <7% indicates that their diabetes is well controlled. A1c targets should be individualized based on duration of diabetes, age, comorbid conditions, and other considerations. . This assay result is consistent with an increased risk of diabetes. . Currently, no consensus exists regarding use of hemoglobin A1c for diagnosis of diabetes for children. .    Mean Plasma Glucose 126 mg/dL   eAG (mmol/L) 7.0 mmol/L  COMPLETE METABOLIC PANEL WITH GFR     Status: None   Collection Time: 08/15/22  3:35 PM  Result Value Ref Range   Glucose, Bld 88 65 - 99 mg/dL    Comment: .            Fasting reference interval .    BUN 13 7 - 25 mg/dL   Creat 0.68 0.60 - 1.00 mg/dL   eGFR 90 > OR = 60 mL/min/1.34m   BUN/Creatinine Ratio SEE NOTE: 6 - 22 (calc)    Comment:    Not Reported: BUN and Creatinine are within    reference range. .    Sodium 138 135 - 146 mmol/L   Potassium 4.2 3.5 - 5.3 mmol/L   Chloride 102 98 - 110 mmol/L   CO2 27 20 - 32 mmol/L    Calcium 9.4 8.6 - 10.4 mg/dL   Total Protein 7.5 6.1 - 8.1 g/dL   Albumin 4.0 3.6 - 5.1 g/dL   Globulin 3.5 1.9 - 3.7 g/dL (calc)   AG Ratio 1.1 1.0 - 2.5 (calc)   Total Bilirubin 0.4 0.2 - 1.2 mg/dL   Alkaline phosphatase (APISO) 80 37 - 153 U/L   AST 18 10 - 35 U/L   ALT 16 6 - 29 U/L  CBC with Differential/Platelet     Status: None   Collection Time: 08/15/22  3:35 PM  Result Value Ref Range   WBC 5.2 3.8 - 10.8 Thousand/uL   RBC 4.24 3.80 - 5.10 Million/uL   Hemoglobin 11.8 11.7 - 15.5 g/dL   HCT 35.5 35.0 - 45.0 %   MCV 83.7 80.0 - 100.0 fL   MCH 27.8 27.0 - 33.0 pg   MCHC 33.2 32.0 - 36.0 g/dL   RDW 13.7 11.0 - 15.0 %   Platelets 257 140 - 400 Thousand/uL   MPV 11.6 7.5 - 12.5 fL   Neutro Abs 3,271 1,500 - 7,800 cells/uL   Lymphs Abs 1,503 850 - 3,900 cells/uL   Absolute Monocytes 317 200 - 950 cells/uL   Eosinophils Absolute 88 15 - 500 cells/uL   Basophils Absolute 21 0 - 200 cells/uL   Neutrophils Relative % 62.9 %   Total Lymphocyte 28.9 %   Monocytes Relative 6.1 %   Eosinophils Relative 1.7 %   Basophils Relative 0.4 %  Vitamin B12     Status: None   Collection Time: 08/15/22  3:35 PM  Result Value Ref Range   Vitamin B-12 668 200 - 1,100 pg/mL    PHQ2/9:    08/15/2022    2:40 PM 06/08/2022    1:23 PM 06/01/2022    8:22 AM 04/28/2022    8:53 AM 04/28/2022    8:46 AM  Depression screen PHQ 2/9  Decreased Interest 0 0 1 0 0  Down, Depressed, Hopeless 0 0 0 0 0  PHQ - 2 Score 0 0 1 0 0  Altered sleeping 0   0   Tired, decreased energy 0   0   Change in appetite 0   0   Feeling bad or failure about yourself  0   0   Trouble concentrating 0   0   Moving slowly or fidgety/restless 0   0   Suicidal thoughts 0   0   PHQ-9 Score 0   0     phq 9 is {gen pos [id number]   Fall Risk:    08/15/2022    2:40 PM 06/08/2022    1:23 PM 06/01/2022    8:21 AM 04/28/2022    8:53 AM 02/01/2022    2:35 PM  Fall Risk   Falls in the past year? 0 0 0 0 0  Number falls  in past yr: 0 0 0    Injury with Fall? 0 0 0    Risk for fall due to : No Fall Risks   No Fall Risks   Follow up Falls prevention discussed Falls evaluation completed Falls evaluation completed Falls prevention discussed Falls prevention discussed      Functional Status Survey:      Assessment & Plan  *** There are no diagnoses linked to this encounter.

## 2022-10-18 NOTE — Progress Notes (Signed)
Acute Office Visit   Patient: Paula Mills   DOB: [birth year]   77 y.o. Female  MRN: [id number] Visit Date: 10/18/2022  Today's healthcare provider: Dani Gobble Emry Barbato, PA-C  Introduced myself to the patient as a Journalist, newspaper and provided education on APPs in clinical practice.    Chief Complaint  Patient presents with   Pain    Right calf (Started 2 weeks ago) and bilateral hips (started around Dec 2023) pt states it hurts worst in the morning and night. When pt is being active doesn't hurt as much.   Subjective    HPI HPI     Pain    Additional comments: Right calf (Started 2 weeks ago) and bilateral hips (started around Dec 2023) pt states it hurts worst in the morning and night. When pt is being active doesn't hurt as much.      Last edited by Salomon Fick, [place] on 10/18/2022  1:44 PM.        Hip pain  Onset: gradual  Duration: on and off since Dec  Location: bilateral hips  Pain level and Character: hips 10/10 at night and in AM, presently 4-5/10 in office achy in character.  Associated symptoms: Alleviating: feels better when she is active  Aggravating: going to bed at night - she can hardly turn over in bed at night, sitting down for extended periods will also cause pain  Pain is worse in the AM when she wakes up  Interventions: Tylenol   Right Calf Pain  Onset: sudden  Duration: 2 weeks  Pain level and character: sharp pain, in AM 9/10, right now in office 4/10  Associated symptoms: she denies swelling, redness, increased warmth  Alleviating: moving improves it, tylenol helps somewhat  Aggravating: sitting still Interventions: Tylenol   Patient returned from trip to [place] before the end of 2023 and right leg pain had started prior to this  She flew on 09/14/22- 09/30/22  She tried to ambulate while on plane  She reports the calf pain was not present during trip but hips did hurt while she was traveling     Medications: Outpatient  Medications Prior to Visit  Medication Sig   COD LIVER OIL PO Take by mouth.   Multiple Vitamins-Minerals (WOMENS MULTIVITAMIN PO) Take 1 capsule by mouth daily.    vitamin B-12 (CYANOCOBALAMIN) 500 MCG tablet Take 1,000 mcg by mouth daily.   Cholecalciferol (VITAMIN D) 2000 units CAPS Take 2,000 capsules by mouth daily.  (Patient not taking: Reported on 10/18/2022)   lidocaine (LIDODERM) 5 % Place 1 patch onto the skin daily. Remove & Discard patch within 12 hours or as directed by MD (Patient not taking: Reported on 10/18/2022)   naproxen (NAPROSYN) 500 MG tablet Take 1 tablet (500 mg total) by mouth 2 (two) times daily with a meal. (Patient not taking: Reported on 10/18/2022)   tiZANidine (ZANAFLEX) 4 MG tablet Take 1 tablet (4 mg total) by mouth every 8 (eight) hours as needed for muscle spasms (muscle tightness). (Patient not taking: Reported on 10/18/2022)   No facility-administered medications prior to visit.    Review of Systems  Constitutional:  Negative for fatigue.  Respiratory:  Negative for cough, chest tightness, shortness of breath and wheezing.   Musculoskeletal:  Positive for arthralgias and myalgias.  Neurological:  Negative for weakness and numbness.       Objective    Pulse 97   Temp 97.9 F (36.6 C) (Oral)  Resp 16   Ht '5\' 2"'$  (1.575 m)   Wt 158 lb 3.2 oz (71.8 kg)   SpO2 95%   BMI 28.94 kg/m    Physical Exam Vitals reviewed.  Constitutional:      General: She is awake.     Appearance: Normal appearance. She is well-developed and well-groomed.  HENT:     Head: Normocephalic and atraumatic.  Pulmonary:     Effort: Pulmonary effort is normal.     Breath sounds: Normal breath sounds. No decreased air movement. No decreased breath sounds, wheezing, rhonchi or rales.  Musculoskeletal:     Right hip: Normal range of motion. Normal strength.     Left hip: Normal range of motion. Normal strength.     Right lower leg: No swelling, deformity, lacerations,  tenderness or bony tenderness. No edema.     Left lower leg: No swelling. No edema.     Right ankle: No tenderness. Normal range of motion. Anterior drawer test negative.     Right Achilles Tendon: Normal. No tenderness or defects. Thompson's test negative.     Left ankle: No tenderness. Normal range of motion. Anterior drawer test negative.     Comments: Right calf circumference: 15.5 in Left calf circumference: 15 in   Neurological:     Mental Status: She is alert.  Psychiatric:        Behavior: Behavior is cooperative.       No results found for any visits on 10/18/22.  Assessment & Plan      No follow-ups on file.      Problem List Items Addressed This Visit   None Visit Diagnoses     Bilateral hip pain    -  Primary Acute, ongoing She reports bilateral hip pain that is worse in the AM as she is getting up and after prolonged periods of sitting  She reports minimal relief with Tylenol but more moderate relief with activity HPI seems most consistent with osteoarthritis  Will get bilateral xrays for confirmation  GFR is okay for NSAIDs -recommend Ibuprofen with food to assist with pain Reviewed that NSAIDs and continued activity are mainstays of management for OA but if needed we can refer to Ortho for steroid injections and evaluation for hip replacement as indicated  Xray results to guide further management  Follow up as needed    Relevant Orders   DG Hip Unilat W OR W/O Pelvis 2-3 Views Right   DG Hip Unilat W OR W/O Pelvis 2-3 Views Left   Right calf pain     Acute, new concern Reports lower right calf pain that is relieved with Tylenol and movement No swelling, redness or increase warmth to area on palpation and ROM is normal from right knee to ankle  Suspect potential muscular injury at this time given location and symptoms Recommend alternating Tylenol and Ibuprofen, rest, warm compresses Can use Voltaren gel, Lidocaine patch, stretches as desired  Follow up as  needed for persistent or progressing symptoms    Relevant Orders   COMPLETE METABOLIC PANEL WITH GFR (Completed)   CBC w/Diff/Platelet (Completed)        No follow-ups on file.   I, Ramyah Pankowski E Phoebe Marter, PA-C, have reviewed all documentation for this visit. The documentation on 10/19/22 for the exam, diagnosis, procedures, and orders are all accurate and complete.   Talitha Givens, MHS, PA-C [facility]

## 2022-10-19 ENCOUNTER — Ambulatory Visit: Payer: PRIVATE HEALTH INSURANCE | Admitting: Family Medicine

## 2022-10-19 LAB — COMPLETE METABOLIC PANEL WITH GFR
AG Ratio: 1.1 (calc) (ref 1.0–2.5)
ALT: 23 U/L (ref 6–29)
AST: 23 U/L (ref 10–35)
Albumin: 4.2 g/dL (ref 3.6–5.1)
Alkaline phosphatase (APISO): 78 U/L (ref 37–153)
BUN: 9 mg/dL (ref 7–25)
CO2: 28 mmol/L (ref 20–32)
Calcium: 9.7 mg/dL (ref 8.6–10.4)
Chloride: 103 mmol/L (ref 98–110)
Creat: 0.64 mg/dL (ref 0.60–1.00)
Globulin: 3.7 g/dL (calc) (ref 1.9–3.7)
Glucose, Bld: 81 mg/dL (ref 65–99)
Potassium: 3.8 mmol/L (ref 3.5–5.3)
Sodium: 139 mmol/L (ref 135–146)
Total Bilirubin: 0.4 mg/dL (ref 0.2–1.2)
Total Protein: 7.9 g/dL (ref 6.1–8.1)
eGFR: 91 mL/min/{1.73_m2} (ref >=60)

## 2022-10-19 LAB — CBC WITH DIFFERENTIAL/PLATELET
Absolute Monocytes: 472 cells/uL (ref 200–950)
Basophils Absolute: 18 cells/uL (ref 0–200)
Basophils Relative: 0.3 %
Eosinophils Absolute: 83 cells/uL (ref 15–500)
Eosinophils Relative: 1.4 %
HCT: 35 % (ref 35.0–45.0)
Hemoglobin: 11.8 g/dL (ref 11.7–15.5)
Lymphs Abs: 1617 cells/uL (ref 850–3900)
MCH: 28.2 pg (ref 27.0–33.0)
MCHC: 33.7 g/dL (ref 32.0–36.0)
MCV: 83.7 fL (ref 80.0–100.0)
MPV: 11 fL (ref 7.5–12.5)
Monocytes Relative: 8 %
Neutro Abs: 3711 cells/uL (ref 1500–7800)
Neutrophils Relative %: 62.9 %
Platelets: 290 10*3/uL (ref 140–400)
RBC: 4.18 10*6/uL (ref 3.80–5.10)
RDW: 13.8 % (ref 11.0–15.0)
Total Lymphocyte: 27.4 %
WBC: 5.9 10*3/uL (ref 3.8–10.8)

## 2022-10-21 NOTE — Progress Notes (Signed)
Your hip xrays show some mild degenerative changes (arthritis associated changes) in the left hip but the right hip appears normal. At this time I think we should proceed with the management plan we discussed - Tylenol and Ibuprofen, continued exercise and activity, warm compresses and stretches. If the pain continues we can refer to Ortho for evaluation and further management

## 2022-11-09 ENCOUNTER — Encounter: Payer: Self-pay | Admitting: Family Medicine

## 2022-11-11 NOTE — Telephone Encounter (Signed)
There wasn't any indication that there is an infection in your hip based on the imaging and labs. At this time I suspect it is likely from arthritis. You can take Tylenol and Ibuprofen to help with the discomfort.  We can refer you to Orthopedics for them to evaluate you and they may try to give you a steroid injection in the hip to help with inflammation and pain. Let us know how you'd like to proceed.

## 2022-11-16 ENCOUNTER — Other Ambulatory Visit: Payer: Self-pay | Admitting: Physician Assistant

## 2022-11-16 DIAGNOSIS — M25551 Pain in right hip: Secondary | ICD-10-CM

## 2022-12-19 ENCOUNTER — Encounter: Payer: Self-pay | Admitting: Family Medicine

## 2022-12-20 ENCOUNTER — Ambulatory Visit
Admission: RE | Admit: 2022-12-20 | Discharge: 2022-12-20 | Disposition: A | Discharge status: Home / Self Care | Payer: Medicare HMO | Source: Ambulatory Visit | Attending: Physician Assistant | Admitting: Physician Assistant

## 2022-12-20 ENCOUNTER — Encounter: Payer: Self-pay | Admitting: Physician Assistant

## 2022-12-20 ENCOUNTER — Ambulatory Visit
Admission: RE | Admit: 2022-12-20 | Discharge: 2022-12-20 | Disposition: A | Discharge status: Home / Self Care | Payer: Medicare HMO | Attending: Physician Assistant | Admitting: Physician Assistant

## 2022-12-20 ENCOUNTER — Ambulatory Visit (INDEPENDENT_AMBULATORY_CARE_PROVIDER_SITE_OTHER): Payer: PRIVATE HEALTH INSURANCE | Admitting: Physician Assistant

## 2022-12-20 VITALS — BP 160/86 | HR 82 | Temp 97.8°F | Resp 16 | Ht 62.0 in | Wt 160.1 lb

## 2022-12-20 DIAGNOSIS — I8391 Asymptomatic varicose veins of right lower extremity: Secondary | ICD-10-CM

## 2022-12-20 DIAGNOSIS — M79661 Pain in right lower leg: Secondary | ICD-10-CM | POA: Diagnosis present

## 2022-12-20 NOTE — Progress Notes (Unsigned)
Acute Office Visit   Patient: Paula Mills   DOB: 23-Nov-1945   77 y.o. Female  MRN: [id number] Visit Date: 12/20/2022  Today's healthcare provider: Dani Gobble Kayia Billinger, PA-C  Introduced myself to the patient as a Journalist, newspaper and provided education on APPs in clinical practice.    Chief Complaint  Patient presents with   Follow-up   Leg Pain    Continuous pain, would like x-ray   Subjective    HPI HPI     Leg Pain    Additional comments: Continuous pain, would like x-ray      Last edited by Salomon Fick, CMA on 12/20/2022  3:28 PM.       She reports she is continuing to have pain in her right hip and leg (lower calf)  She has been seen by Ortho - she was given a medication from Ortho (gradual taper- sounds like a steroid) that helped with the pain in her hip but not in her lower calf   She states her left hip is not painful  She reports when she gets up at night to use the restroom, her leg and hip hurts and she has trouble getting back to sleep   She has been using Ibuprofen for the pain but has not taken today She reports pain is better when she is active throughout the day and after exercising-  in both her right hip and lower leg She reports completing PT for her right hip and right leg last Nov   Pain level and character: 8/10 and makes it hard to move, usually dull achy sensation   She reports a chronic history of varicose veins but denies pain along them at all   Medications: Outpatient Medications Prior to Visit  Medication Sig   Cholecalciferol (VITAMIN D) 2000 units CAPS Take 2,000 capsules by mouth daily.   COD LIVER OIL PO Take by mouth.   Multiple Vitamins-Minerals (WOMENS MULTIVITAMIN PO) Take 1 capsule by mouth daily.    vitamin B-12 (CYANOCOBALAMIN) 500 MCG tablet Take 1,000 mcg by mouth daily.   lidocaine (LIDODERM) 5 % Place 1 patch onto the skin daily. Remove & Discard patch within 12 hours or as directed by MD (Patient not taking:  Reported on 10/18/2022)   naproxen (NAPROSYN) 500 MG tablet Take 1 tablet (500 mg total) by mouth 2 (two) times daily with a meal. (Patient not taking: Reported on 10/18/2022)   tiZANidine (ZANAFLEX) 4 MG tablet Take 1 tablet (4 mg total) by mouth every 8 (eight) hours as needed for muscle spasms (muscle tightness). (Patient not taking: Reported on 10/18/2022)   No facility-administered medications prior to visit.    Review of Systems  Musculoskeletal:  Positive for arthralgias.       Objective    BP (!) 160/86   Pulse 82   Temp 97.8 F (36.6 C) (Oral)   Resp 16   Ht 5\' 2"  (1.575 m)   Wt 160 lb 1.6 oz (72.6 kg)   SpO2 98%   BMI 29.28 kg/m    Physical Exam Vitals reviewed.  Constitutional:      General: She is awake.     Appearance: Normal appearance. She is well-developed and well-groomed.  HENT:     Head: Normocephalic and atraumatic.  Pulmonary:     Effort: Pulmonary effort is normal.  Musculoskeletal:     Right hip: No deformity or lacerations. Normal range of motion. Normal strength.  Left hip: No deformity or lacerations. Normal range of motion. Normal strength.     Right lower leg: Normal. No swelling or deformity. No edema.     Left lower leg: Normal. No swelling or deformity. No edema.     Right foot: Normal range of motion. No swelling, deformity or tenderness. Normal pulse.     Left foot: Normal range of motion. No swelling, deformity or tenderness. Normal pulse.  Skin:    General: Skin is warm and dry.  Neurological:     General: No focal deficit present.     Mental Status: She is alert and oriented to person, place, and time. Mental status is at baseline.  Psychiatric:        Mood and Affect: Mood normal.        Behavior: Behavior normal. Behavior is cooperative.        Thought Content: Thought content normal.        Judgment: Judgment normal.       No results found for any visits on 12/20/22.  Assessment & Plan      No follow-ups on file.       Problem List Items Addressed This Visit       Cardiovascular and Mediastinum   Varicose veins of right calf - Primary    Chronic, ongoing problem Patient reports ongoing lower right calf pain that is not resolving with alternating Tylenol and ibuprofen She has been to see orthopedics and was prescribed a steroid taper to assist with her pain.  She reports that this assisted greatly with the pain in her hip but did not provide much improvement with right calf pain She reports that she is concerned that her varicose veins have caused a blood clot.  I reviewed with her the signs and symptoms of DVT that include swelling, redness, intense pain, increased warmth to the area.  PE does not reveal any of the signs or symptoms. She has a Wells criteria for DVT score of 0 Will order x-ray imaging of the tib-fib and ankle on right leg.  Due to persistent concerns of patient for DVT will also order ultrasound.  I suspect that her varicose veins are likely the cause of her lower leg pain so I am referring her to vein and vascular services for assistance with this I recommend she continue to stay active and exercise regularly.  Imaging results to dictate further management. Follow-up as needed for persistent or progressing symptoms.      Relevant Orders   Ambulatory referral to Vascular Surgery   US Venous Img Lower Unilateral Right     Other   Right calf pain   Relevant Orders   US Venous Img Lower Unilateral Right   DG Ankle Complete Right   DG Tibia/Fibula Right     No follow-ups on file.   I, Zayveon Raschke E Libertie Hausler, PA-C, have reviewed all documentation for this visit. The documentation on 12/21/22 for the exam, diagnosis, procedures, and orders are all accurate and complete.   Talitha Givens, MHS, PA-C [facility]

## 2022-12-21 DIAGNOSIS — M79661 Pain in right lower leg: Secondary | ICD-10-CM | POA: Insufficient documentation

## 2022-12-21 DIAGNOSIS — I8391 Asymptomatic varicose veins of right lower extremity: Secondary | ICD-10-CM | POA: Insufficient documentation

## 2022-12-21 NOTE — Assessment & Plan Note (Signed)
Chronic, ongoing problem Patient reports ongoing lower right calf pain that is not resolving with alternating Tylenol and ibuprofen She has been to see orthopedics and was prescribed a steroid taper to assist with her pain.  She reports that this assisted greatly with the pain in her hip but did not provide much improvement with right calf pain She reports that she is concerned that her varicose veins have caused a blood clot.  I reviewed with her the signs and symptoms of DVT that include swelling, redness, intense pain, increased warmth to the area.  PE does not reveal any of the signs or symptoms. She has a Wells criteria for DVT score of 0 Will order x-ray imaging of the tib-fib and ankle on right leg.  Due to persistent concerns of patient for DVT will also order ultrasound.  I suspect that her varicose veins are likely the cause of her lower leg pain so I am referring her to vein and vascular services for assistance with this I recommend she continue to stay active and exercise regularly.  Imaging results to dictate further management. Follow-up as needed for persistent or progressing symptoms.

## 2022-12-26 ENCOUNTER — Ambulatory Visit
Admission: RE | Admit: 2022-12-26 | Discharge: 2022-12-26 | Disposition: A | Discharge status: Home / Self Care | Payer: Medicare HMO | Source: Ambulatory Visit | Attending: Physician Assistant | Admitting: Physician Assistant

## 2022-12-26 DIAGNOSIS — I8391 Asymptomatic varicose veins of right lower extremity: Secondary | ICD-10-CM | POA: Diagnosis not present

## 2022-12-26 DIAGNOSIS — M79661 Pain in right lower leg: Secondary | ICD-10-CM | POA: Diagnosis present

## 2022-12-26 NOTE — Progress Notes (Signed)
Your xrays show that there is a mild amount of soft tissue swelling in your ankle but no fracture or dislocation. This swelling can be due to inflammation or injury of the ligaments, tendons or muscles in the ankle but xray cannot capture an adequate view of this. Your right knee appears to have moderate to severe osteoarthritis and some fluid in the joint. I recommend speaking to your Orthopedics provider about this. Sometimes compensating for a joint with arthritis can lead to pain in other parts of the leg which might be what is happening here. We are still waiting on the results of your ultrasound so we will keep you updated on that once it comes back.

## 2022-12-28 NOTE — Progress Notes (Signed)
Your ultrasound results did not find evidence of a clot in your leg. It does appear that there is a cyst behind your knee that may cause some discomfort or tightness in the area. Orthopedics is typically the best recourse for managing these.

## 2023-02-03 ENCOUNTER — Ambulatory Visit: Payer: PRIVATE HEALTH INSURANCE | Admitting: Family Medicine

## 2023-02-10 NOTE — Progress Notes (Unsigned)
Name: Paula Mills   MRN: [id number]    DOB: Oct 15, 1945   Date:02/13/2023       Progress Note  Subjective  Chief Complaint  Follow Up  HPI  Atherosclerosis or Aorta/carotid atherosclerosis/Hyperlipidemia: she states dyslipidemia occurs in her family but mother was 95 and died from aneurysm of brain. Father died at 8 from complications of MS. She refuses to take medication. Reminded her again to try at least aspirin 81 mg daily    Osteoporosis: explained the results from last year, getting worse, she is Alendronate but not taking every week. We discussed importance of taking it daily but after she read possible side effects  she decided to stop taking it all together . Discussed importance of high calcium diet , vitamin D supplementation and consider seeing Endo for other options - such as Reclast, Forteo, Prolia or Evista, she is not ready to take medication  PAD: she had a screen ABI that was abnormal, denies claudication and not interested in statin therapy. Discussed referral to vascular surgeon   DDD lumbar spine with radiculitis: seen by [facility] , had MRI lumbar spine and is trying PT before having steroid injections. She has finished  a steroid taper and it helped with outer hip /buttocks pain but still has pain on right lateral calf   Cough: going on for about one month, she states sometimes cough is productive, no sob, rhinorrhea but has chest congestion. No fever or chills, but feeling slightly better. Normal appetite She has not tried any otc medication  OA knee : not causing pain, ortho thinks pain on right lateral calf is secondary to her back not her knee   She is frustrated about feeling tired, aching joints, stiffness in the mornings that last about one hour   Patient Active Problem List   Diagnosis Date Noted   Varicose veins of right calf 12/21/2022   Right calf pain 12/21/2022   History of colonic polyps    Screen for colon cancer    Benign neoplasm of  ascending colon    Polyp of sigmoid colon    Carotid artery calcification, bilateral 10/18/2016   At risk for falling 04/08/2016   H/O: HTN (hypertension) 04/08/2016   Hypo-ovarianism 04/08/2016   Asymptomatic varicose veins 04/08/2016   Avitaminosis D 04/08/2016   Hiatal hernia 02/09/2016   Liver cyst 02/09/2016   Lumbar spondylosis 02/09/2016   Osteoporosis 02/09/2016   Dyslipidemia 02/09/2016   Vegetarian 02/09/2016   Vitamin D deficiency 02/09/2016   Calcification of abdominal aorta (HCC) 02/09/2016   History of small bowel obstruction 03/12/2015    Past Surgical History:  Procedure Laterality Date   ABDOMINAL HYSTERECTOMY     APPENDECTOMY N/A 03/14/2015   Procedure: APPENDECTOMY;  Surgeon: Duwaine Maxin, MD;  Location: [facility] ORS;  Service: General;  Laterality: N/A;   CESAREAN SECTION     COLONOSCOPY WITH PROPOFOL N/A 01/31/2017   Procedure: COLONOSCOPY WITH PROPOFOL;  Surgeon: Midge Minium, MD;  Location: [facility] ENDOSCOPY;  Service: Endoscopy;  Laterality: N/A;   COLONOSCOPY WITH PROPOFOL N/A 05/24/2022   Procedure: COLONOSCOPY WITH PROPOFOL;  Surgeon: Midge Minium, MD;  Location: [facility] ENDOSCOPY;  Service: Endoscopy;  Laterality: N/A;   LAPAROTOMY N/A 03/14/2015   Procedure: EXPLORATORY LAPAROTOMY;  Surgeon: Duwaine Maxin, MD;  Location: [facility] ORS;  Service: General;  Laterality: N/A;    Family History  Problem Relation Age of Onset   Hyperlipidemia Mother    Hypertension Mother    Dementia Mother  Multiple sclerosis Father    Hypertension Father    Hypertension Sister    Hyperlipidemia Sister    Multiple sclerosis Sister     Social History   Tobacco Use   Smoking status: Never   Smokeless tobacco: Never  Substance Use Topics   Alcohol use: Not Currently    Alcohol/week: 0.0 standard drinks of alcohol     Current Outpatient Medications:    Cholecalciferol (VITAMIN D) 2000 units CAPS, Take 2,000 capsules by mouth daily., Disp: , Rfl:    COD LIVER OIL PO,  Take by mouth., Disp: , Rfl:    lidocaine (LIDODERM) 5 %, Place 1 patch onto the skin daily. Remove & Discard patch within 12 hours or as directed by MD, Disp: 30 patch, Rfl: 0   Multiple Vitamins-Minerals (WOMENS MULTIVITAMIN PO), Take 1 capsule by mouth daily. , Disp: , Rfl:    vitamin B-12 (CYANOCOBALAMIN) 500 MCG tablet, Take 1,000 mcg by mouth daily., Disp: , Rfl:    naproxen (NAPROSYN) 500 MG tablet, Take 1 tablet (500 mg total) by mouth 2 (two) times daily with a meal. (Patient not taking: Reported on 10/18/2022), Disp: 30 tablet, Rfl: 0   tiZANidine (ZANAFLEX) 4 MG tablet, Take 1 tablet (4 mg total) by mouth every 8 (eight) hours as needed for muscle spasms (muscle tightness). (Patient not taking: Reported on 10/18/2022), Disp: 90 tablet, Rfl: 2  No Known Allergies  I personally reviewed active problem list, medication list, allergies, family history, social history, health maintenance with the patient/caregiver today.   ROS  Ten systems reviewed and is negative except as mentioned in HPI   Objective  Vitals:   02/13/23 1354  BP: 136/74  Pulse: 94  Resp: 16  SpO2: 97%  Weight: 158 lb (71.7 kg)  Height: 5\' 2"  (1.575 m)    Body mass index is 28.9 kg/m.  Physical Exam  Constitutional: Patient appears well-developed and well-nourished.  No distress.  HEENT: head atraumatic, normocephalic, pupils equal and reactive to light, neck supple Cardiovascular: Normal rate, regular rhythm and normal heart sounds.  No murmur heard. No BLE edema. Pulmonary/Chest: Effort normal and breath sounds normal. No respiratory distress. Abdominal: Soft.  There is no tenderness. Muscular skeletal: no synovitis  Psychiatric: Patient has a normal mood and affect. behavior is normal. Judgment and thought content normal.    PHQ2/9:    02/13/2023    1:54 PM 12/20/2022    3:25 PM 10/18/2022    1:32 PM 08/15/2022    2:40 PM 06/08/2022    1:23 PM  Depression screen PHQ 2/9  Decreased Interest 0 0 0  0 0  Down, Depressed, Hopeless 1 0 0 0 0  PHQ - 2 Score 1 0 0 0 0  Altered sleeping 0 0 0 0   Tired, decreased energy 1 0 0 0   Change in appetite 0 0 0 0   Feeling bad or failure about yourself  0 0 0 0   Trouble concentrating 0 0 0 0   Moving slowly or fidgety/restless 0 0 0 0   Suicidal thoughts 0 0 0 0   PHQ-9 Score 2 0 0 0   Difficult doing work/chores  Not difficult at all Not difficult at all      phq 9 is positive   Fall Risk:    02/13/2023    1:53 PM 12/20/2022    3:25 PM 10/18/2022    1:32 PM 08/15/2022    2:40 PM 06/08/2022  1:23 PM  Fall Risk   Falls in the past year? 0 0 0 0 0  Number falls in past yr: 0 0 0 0 0  Injury with Fall? 0 0 0 0 0  Risk for fall due to : No Fall Risks No Fall Risks No Fall Risks No Fall Risks   Follow up Falls prevention discussed Falls prevention discussed;Education provided;Falls evaluation completed Falls prevention discussed;Education provided;Falls evaluation completed Falls prevention discussed Falls evaluation completed     Functional Status Survey: Is the patient deaf or have difficulty hearing?: No Does the patient have difficulty seeing, even when wearing glasses/contacts?: No Does the patient have difficulty concentrating, remembering, or making decisions?: No Does the patient have difficulty walking or climbing stairs?: No Does the patient have difficulty dressing or bathing?: No Does the patient have difficulty doing errands alone such as visiting a doctor's office or shopping?: No    Assessment & Plan  1. Abnormal ankle brachial index (ABI)  - Ambulatory referral to Vascular Surgery  2. Varicose veins of right calf  - Ambulatory referral to Vascular Surgery  3. Morning stiffness of joints  - C-reactive protein - Sedimentation rate - ANA,IFA RA Diag Pnl w/rflx Tit/Patn  4. Age-related osteoporosis without current pathological fracture  Refuses medications   5. Hyperglycemia  - Hemoglobin A1c  6.  Arthralgia, unspecified joint  - C-reactive protein - Sedimentation rate - ANA,IFA RA Diag Pnl w/rflx Tit/Patn  7. Degenerative disc disease, lumbar  Keep follow up with ortho  8. Subacute cough  - DG Chest 2 View; Future - guaiFENesin (MUCINEX) 600 MG 12 hr tablet; Take 1 tablet (600 mg total) by mouth 2 (two) times daily.  Dispense: 60 tablet; Refill: 0  9. Long-term use of high-risk medication  - COMPLETE METABOLIC PANEL WITH GFR  10. Breast cancer screening by mammogram  - MM 3D SCREENING MAMMOGRAM BILATERAL BREAST; Future

## 2023-02-13 ENCOUNTER — Ambulatory Visit (INDEPENDENT_AMBULATORY_CARE_PROVIDER_SITE_OTHER): Payer: Medicare HMO | Admitting: Family Medicine

## 2023-02-13 ENCOUNTER — Encounter: Payer: Self-pay | Admitting: Family Medicine

## 2023-02-13 VITALS — BP 136/74 | HR 94 | Resp 16 | Ht 62.0 in | Wt 158.0 lb

## 2023-02-13 DIAGNOSIS — R9439 Abnormal result of other cardiovascular function study: Secondary | ICD-10-CM

## 2023-02-13 DIAGNOSIS — M810 Age-related osteoporosis without current pathological fracture: Secondary | ICD-10-CM

## 2023-02-13 DIAGNOSIS — R6889 Other general symptoms and signs: Secondary | ICD-10-CM

## 2023-02-13 DIAGNOSIS — Z1231 Encounter for screening mammogram for malignant neoplasm of breast: Secondary | ICD-10-CM

## 2023-02-13 DIAGNOSIS — Z79899 Other long term (current) drug therapy: Secondary | ICD-10-CM

## 2023-02-13 DIAGNOSIS — M2550 Pain in unspecified joint: Secondary | ICD-10-CM

## 2023-02-13 DIAGNOSIS — R739 Hyperglycemia, unspecified: Secondary | ICD-10-CM

## 2023-02-13 DIAGNOSIS — I8391 Asymptomatic varicose veins of right lower extremity: Secondary | ICD-10-CM

## 2023-02-13 DIAGNOSIS — M5136 Other intervertebral disc degeneration, lumbar region: Secondary | ICD-10-CM

## 2023-02-13 DIAGNOSIS — M2560 Stiffness of unspecified joint, not elsewhere classified: Secondary | ICD-10-CM

## 2023-02-13 DIAGNOSIS — R052 Subacute cough: Secondary | ICD-10-CM

## 2023-02-13 MED ORDER — GUAIFENESIN ER 600 MG PO TB12: 600.0000 mg | ORAL_TABLET | Freq: Two times a day (BID) | ORAL | 0 refills | Status: DC

## 2023-02-15 LAB — COMPLETE METABOLIC PANEL WITH GFR
AG Ratio: 1.2 (calc) (ref 1.0–2.5)
ALT: 24 U/L (ref 6–29)
AST: 27 U/L (ref 10–35)
Albumin: 4.4 g/dL (ref 3.6–5.1)
Alkaline phosphatase (APISO): 91 U/L (ref 37–153)
BUN/Creatinine Ratio: 15 (calc) (ref 6–22)
BUN: 8 mg/dL (ref 7–25)
CO2: 29 mmol/L (ref 20–32)
Calcium: 9.5 mg/dL (ref 8.6–10.4)
Chloride: 102 mmol/L (ref 98–110)
Creat: 0.54 mg/dL — ABNORMAL LOW (ref 0.60–1.00)
Globulin: 3.6 g/dL (calc) (ref 1.9–3.7)
Glucose, Bld: 85 mg/dL (ref 65–99)
Potassium: 3.6 mmol/L (ref 3.5–5.3)
Sodium: 139 mmol/L (ref 135–146)
Total Bilirubin: 0.3 mg/dL (ref 0.2–1.2)
Total Protein: 8 g/dL (ref 6.1–8.1)
eGFR: 95 mL/min/{1.73_m2} (ref >=60)

## 2023-02-15 LAB — HEMOGLOBIN A1C
Hgb A1c MFr Bld: 6.1 % of total Hgb — ABNORMAL HIGH (ref <5.7)
Mean Plasma Glucose: 128 mg/dL
eAG (mmol/L): 7.1 mmol/L

## 2023-02-15 LAB — ANA,IFA RA DIAG PNL W/RFLX TIT/PATN
Anti Nuclear Antibody (ANA): NEGATIVE
Cyclic Citrullin Peptide Ab: <16 UNITS
Rheumatoid fact SerPl-aCnc: <10 IU/mL (ref <14)

## 2023-02-15 LAB — C-REACTIVE PROTEIN: CRP: <3 mg/L (ref <8.0)

## 2023-02-15 LAB — SEDIMENTATION RATE: Sed Rate: 36 mm/h — ABNORMAL HIGH (ref 0–30)

## 2023-03-09 ENCOUNTER — Other Ambulatory Visit (INDEPENDENT_AMBULATORY_CARE_PROVIDER_SITE_OTHER): Payer: Self-pay | Admitting: Nurse Practitioner

## 2023-03-09 DIAGNOSIS — I739 Peripheral vascular disease, unspecified: Secondary | ICD-10-CM

## 2023-03-09 DIAGNOSIS — R6889 Other general symptoms and signs: Secondary | ICD-10-CM

## 2023-03-09 DIAGNOSIS — I8391 Asymptomatic varicose veins of right lower extremity: Secondary | ICD-10-CM

## 2023-03-20 ENCOUNTER — Ambulatory Visit (INDEPENDENT_AMBULATORY_CARE_PROVIDER_SITE_OTHER): Payer: Medicare HMO

## 2023-03-20 ENCOUNTER — Encounter (INDEPENDENT_AMBULATORY_CARE_PROVIDER_SITE_OTHER): Payer: Self-pay | Admitting: Nurse Practitioner

## 2023-03-20 ENCOUNTER — Ambulatory Visit (INDEPENDENT_AMBULATORY_CARE_PROVIDER_SITE_OTHER): Payer: Medicare HMO | Admitting: Nurse Practitioner

## 2023-03-20 VITALS — BP 177/74 | HR 61 | Resp 18 | Ht 62.5 in | Wt 158.6 lb

## 2023-03-20 DIAGNOSIS — I739 Peripheral vascular disease, unspecified: Secondary | ICD-10-CM

## 2023-03-20 DIAGNOSIS — R6889 Other general symptoms and signs: Secondary | ICD-10-CM | POA: Diagnosis not present

## 2023-03-20 DIAGNOSIS — I8391 Asymptomatic varicose veins of right lower extremity: Secondary | ICD-10-CM

## 2023-03-21 LAB — VAS US ABI WITH/WO TBI
Left ABI: 1.01
Right ABI: 0.99

## 2023-03-21 NOTE — Progress Notes (Signed)
Subjective:    Patient ID: Paula Mills, female    DOB: October 18, 1945, 77 y.o.   MRN: [id number] Chief Complaint  Patient presents with   New Patient (Initial Visit)    np. ABI/Reflux + consult. PAD/RLE VV: she had a screen ABI that was abnormal, denies claudication and not interested in statin therapy. referred by Alba Cory.    Paula Mills is a 77 year old female who presents today for evaluation of of evaluation of pain in the right calf area.  In addition the patient had ABIs which show some abnormal perfusion which is also being followed up on.  Currently the patient notes that she has pain in her right calf area has been ongoing for about the last 6 months or so.  She notes that the pain is worse if she sits or stands in 1 area but if she is able to be active and walks and moves the pain is improved.  Patient is very active and goes to the gym on a very regular basis.  She has large varicosities on the right calf as well.  She has had workup and the calf pain has not been relieved due to nerve compression.  She notes that she got some upcoming follow-up with shots for this soon.  Today the patient had an ABI of 0.99 on the right and 1.1 on the left.  There is a TBI 0.74 on the right and 0.59 on the left.  Patient has triphasic waveforms on the right with biphasic/monophasic waveforms on the left.  No evidence of DVT or superficial phlebitis in the right lower extremity.  No evidence of deep venous insufficiency.  There is significant evidence of venous reflux in the right great saphenous vein throughout.    Review of Systems  Cardiovascular:  Negative for leg swelling.  Musculoskeletal:  Positive for myalgias.  All other systems reviewed and are negative.      Objective:   Physical Exam Vitals reviewed.  HENT:     Head: Normocephalic.  Cardiovascular:     Rate and Rhythm: Normal rate.     Pulses:          Dorsalis pedis pulses are 2+ on the right side and 2+ on the left  side.  Pulmonary:     Effort: Pulmonary effort is normal.  Musculoskeletal:     Right lower leg: No edema.     Left lower leg: No edema.  Skin:    General: Skin is warm and dry.  Neurological:     Mental Status: She is alert and oriented to person, place, and time.  Psychiatric:        Mood and Affect: Mood normal.        Behavior: Behavior normal.        Thought Content: Thought content normal.        Judgment: Judgment normal.     BP (!) 177/74 (BP Location: Left Arm)   Pulse 61   Resp 18   Ht 5' 2.5" (1.588 m)   Wt 158 lb 9.6 oz (71.9 kg)   BMI 28.55 kg/m   Past Medical History:  Diagnosis Date   Hyperlipidemia    Vertigo     Social History   Socioeconomic History   Marital status: Married    Spouse name: Gardiner Barefoot   Number of children: 2   Years of education: Not on file   Highest education level: Master's degree (e.g., MA, MS, MEng, MEd, MSW, MBA)  Occupational  History   Occupation: Retired  Tobacco Use   Smoking status: Never   Smokeless tobacco: Never  Vaping Use   Vaping Use: Never used  Substance and Sexual Activity   Alcohol use: Not Currently    Alcohol/week: 0.0 standard drinks of alcohol   Drug use: No   Sexual activity: Not Currently    Partners: Male  Other Topics Concern   Not on file  Social History Narrative   Husband has a pacemaker    Social Determinants of Health   Financial Resource Strain: Low Risk  (08/15/2022)   Overall Financial Resource Strain (CARDIA)    Difficulty of Paying Living Expenses: Not hard at all  Food Insecurity: No Food Insecurity (08/15/2022)   Hunger Vital Sign    Worried About Running Out of Food in the Last Year: Never true    Ran Out of Food in the Last Year: Never true  Transportation Needs: No Transportation Needs (08/15/2022)   PRAPARE - Administrator, Civil Service (Medical): No    Lack of Transportation (Non-Medical): No  Physical Activity: Sufficiently Active (08/15/2022)   Exercise  Vital Sign    Days of Exercise per Week: 5 days    Minutes of Exercise per Session: 40 min  Stress: No Stress Concern Present (08/15/2022)   [employer] of Occupational Health - Occupational Stress Questionnaire    Feeling of Stress : Not at all  Social Connections: Socially Integrated (08/15/2022)   Social Connection and Isolation Panel [NHANES]    Frequency of Communication with Friends and Family: More than three times a week    Frequency of Social Gatherings with Friends and Family: More than three times a week    Attends Religious Services: More than 4 times per year    Active Member of Clubs or Organizations: Yes    Attends Banker Meetings: More than 4 times per year    Marital Status: Married  Catering manager Violence: Not At Risk (08/15/2022)   Humiliation, Afraid, Rape, and Kick questionnaire    Fear of Current or Ex-Partner: No    Emotionally Abused: No    Physically Abused: No    Sexually Abused: No    Past Surgical History:  Procedure Laterality Date   ABDOMINAL HYSTERECTOMY     APPENDECTOMY N/A 03/14/2015   Procedure: APPENDECTOMY;  Surgeon: Duwaine Maxin, MD;  Location: [facility] ORS;  Service: General;  Laterality: N/A;   CESAREAN SECTION     COLONOSCOPY WITH PROPOFOL N/A 01/31/2017   Procedure: COLONOSCOPY WITH PROPOFOL;  Surgeon: Midge Minium, MD;  Location: [facility] ENDOSCOPY;  Service: Endoscopy;  Laterality: N/A;   COLONOSCOPY WITH PROPOFOL N/A 05/24/2022   Procedure: COLONOSCOPY WITH PROPOFOL;  Surgeon: Midge Minium, MD;  Location: [facility] ENDOSCOPY;  Service: Endoscopy;  Laterality: N/A;   LAPAROTOMY N/A 03/14/2015   Procedure: EXPLORATORY LAPAROTOMY;  Surgeon: Duwaine Maxin, MD;  Location: [facility] ORS;  Service: General;  Laterality: N/A;    Family History  Problem Relation Age of Onset   Hyperlipidemia Mother    Hypertension Mother    Dementia Mother    Multiple sclerosis Father    Hypertension Father    Hypertension Sister    Hyperlipidemia  Sister    Multiple sclerosis Sister     No Known Allergies     Latest Ref Rng & Units 10/18/2022    2:31 PM 08/15/2022    3:35 PM 08/02/2021    2:53 PM  CBC  WBC 3.8 - 10.8 Thousand/uL 5.9  5.2  5.4   Hemoglobin 11.7 - 15.5 g/dL 41.3  24.4  01.0   Hematocrit 35.0 - 45.0 % 35.0  35.5  36.8   Platelets 140 - 400 Thousand/uL 290  257  285       CMP     Component Value Date/Time   NA 139 02/13/2023 1453   NA 142 02/09/2016 1249   K 3.6 02/13/2023 1453   CL 102 02/13/2023 1453   CO2 29 02/13/2023 1453   GLUCOSE 85 02/13/2023 1453   BUN 8 02/13/2023 1453   BUN 7 (L) 02/09/2016 1249   CREATININE 0.54 (L) 02/13/2023 1453   CALCIUM 9.5 02/13/2023 1453   PROT 8.0 02/13/2023 1453   PROT 7.7 02/09/2016 1249   ALBUMIN 4.1 04/10/2017 1513   ALBUMIN 4.4 02/09/2016 1249   AST 27 02/13/2023 1453   ALT 24 02/13/2023 1453   ALKPHOS 90 04/10/2017 1513   BILITOT 0.3 02/13/2023 1453   BILITOT 0.4 02/09/2016 1249   EGFR 95 02/13/2023 1453   GFRNONAA 87 07/29/2020 1437     No results found.     Assessment & Plan:   1. Varicose veins of right calf The patient does have significant reflux in the right lower extremity which may account for some of the pain that she is having, or at least contributing to it in part.  I have recommended that the patient proceed with conservative therapy at this time including use of medical grade compression, elevation and activity.  She will undergo conservative therapy for 3 months.  Will have her return for follow-up evaluation to see if this is causing improvement as well as to see if the shots also improved her pain in her calf as well.  If she notes that there is some improvement with use of compression we can also discuss endovenous laser ablation. 2. PAD (peripheral artery disease) (HCC) Patient studies indicate that the patient does have some very mild peripheral arterial disease.  Certainly there is no evidence of limb threatening or ischemia.   There is currently no symptoms.  Based on this we will plan to have the patient return in 1 year for repeat studies or sooner if issues arise.   Current Outpatient Medications on File Prior to Visit  Medication Sig Dispense Refill   Cholecalciferol (VITAMIN D) 2000 units CAPS Take 2,000 capsules by mouth daily.     COD LIVER OIL PO Take by mouth.     Multiple Vitamins-Minerals (WOMENS MULTIVITAMIN PO) Take 1 capsule by mouth daily.      vitamin B-12 (CYANOCOBALAMIN) 500 MCG tablet Take 1,000 mcg by mouth daily.     No current facility-administered medications on file prior to visit.    There are no Patient Instructions on file for this visit. No follow-ups on file.   Georgiana Spinner, NP

## 2023-03-22 ENCOUNTER — Ambulatory Visit
Admission: RE | Admit: 2023-03-22 | Discharge: 2023-03-22 | Disposition: A | Discharge status: Home / Self Care | Payer: Medicare HMO | Source: Ambulatory Visit | Attending: Family Medicine | Admitting: Family Medicine

## 2023-03-22 DIAGNOSIS — Z1231 Encounter for screening mammogram for malignant neoplasm of breast: Secondary | ICD-10-CM | POA: Diagnosis present

## 2023-04-27 ENCOUNTER — Ambulatory Visit (INDEPENDENT_AMBULATORY_CARE_PROVIDER_SITE_OTHER): Payer: Medicare HMO

## 2023-04-27 VITALS — Ht 62.5 in | Wt 158.0 lb

## 2023-04-27 DIAGNOSIS — Z0000 Encounter for general adult medical examination without abnormal findings: Secondary | ICD-10-CM | POA: Diagnosis not present

## 2023-04-27 NOTE — Progress Notes (Signed)
Subjective:   Paula Mills is a 77 y.o. female who presents for Medicare Annual (Subsequent) preventive examination.  Visit Complete: Virtual  I connected with  Paula Mills on 04/27/23 by a audio enabled telemedicine application and verified that I am speaking with the correct person using two identifiers.  Patient Location: Home  Provider Location: Home Office  I discussed the limitations of evaluation and management by telemedicine. The patient expressed understanding and agreed to proceed.  Patient Medicare AWV questionnaire was completed by the patient on  ; I have confirmed that all information answered by patient is correct and no changes since this date.  Review of Systems      Cardiac Risk Factors include: advanced age (>71men, >14 women)     Objective:    Today's Vitals   04/27/23 1110  Weight: 158 lb (71.7 kg)  Height: 5' 2.5" (1.588 m)   Body mass index is 28.44 kg/m.     04/27/2023   11:21 AM 07/12/2022    3:53 PM 05/24/2022    8:30 AM 04/27/2021    8:45 AM 04/02/2020    1:54 PM 03/28/2019    1:22 PM 03/23/2018    1:06 PM  Advanced Directives  Does Patient Have a Medical Advance Directive? Yes Yes Yes Yes Yes Yes Yes  Type of Estate agent of [place];Living will Living will Healthcare Power of [place];Living will Healthcare Power of [place];Living will Healthcare Power of [place];Living will Healthcare Power of [place];Living will Healthcare Power of [place];Living will  Does patient want to make changes to medical advance directive?  No - Patient declined  Yes (MAU/Ambulatory/Procedural Areas - Information given)     Copy of Healthcare Power of Attorney in Chart? No - copy requested  No - copy requested No - copy requested No - copy requested No - copy requested No - copy requested  Would patient like information on creating a medical advance directive?  No - Patient declined No - Patient declined        Current Medications  (verified) Outpatient Encounter Medications as of 04/27/2023  Medication Sig   Cholecalciferol (VITAMIN D) 2000 units CAPS Take 2,000 capsules by mouth daily.   COD LIVER OIL PO Take by mouth.   Multiple Vitamins-Minerals (WOMENS MULTIVITAMIN PO) Take 1 capsule by mouth daily.    vitamin B-12 (CYANOCOBALAMIN) 500 MCG tablet Take 1,000 mcg by mouth daily.   No facility-administered encounter medications on file as of 04/27/2023.    Allergies (verified) Patient has no known allergies.   History: Past Medical History:  Diagnosis Date   Hyperlipidemia    Vertigo    Past Surgical History:  Procedure Laterality Date   ABDOMINAL HYSTERECTOMY     APPENDECTOMY N/A 03/14/2015   Procedure: APPENDECTOMY;  Surgeon: Duwaine Maxin, MD;  Location: [facility] ORS;  Service: General;  Laterality: N/A;   CESAREAN SECTION     COLONOSCOPY WITH PROPOFOL N/A 01/31/2017   Procedure: COLONOSCOPY WITH PROPOFOL;  Surgeon: Midge Minium, MD;  Location: [facility] ENDOSCOPY;  Service: Endoscopy;  Laterality: N/A;   COLONOSCOPY WITH PROPOFOL N/A 05/24/2022   Procedure: COLONOSCOPY WITH PROPOFOL;  Surgeon: Midge Minium, MD;  Location: [facility] ENDOSCOPY;  Service: Endoscopy;  Laterality: N/A;   LAPAROTOMY N/A 03/14/2015   Procedure: EXPLORATORY LAPAROTOMY;  Surgeon: Duwaine Maxin, MD;  Location: [facility] ORS;  Service: General;  Laterality: N/A;   Family History  Problem Relation Age of Onset   Hyperlipidemia Mother    Hypertension Mother  Dementia Mother    Multiple sclerosis Father    Hypertension Father    Hypertension Sister    Hyperlipidemia Sister    Multiple sclerosis Sister    Social History   Socioeconomic History   Marital status: Married    Spouse name: Paula Mills   Number of children: 2   Years of education: Not on file   Highest education level: Master's degree (e.g., MA, MS, MEng, MEd, MSW, MBA)  Occupational History   Occupation: Retired  Tobacco Use   Smoking status: Never   Smokeless tobacco:  Never  Vaping Use   Vaping status: Never Used  Substance and Sexual Activity   Alcohol use: Not Currently    Alcohol/week: 0.0 standard drinks of alcohol   Drug use: No   Sexual activity: Not Currently    Partners: Male  Other Topics Concern   Not on file  Social History Narrative   Husband has a pacemaker    Social Determinants of Health   Financial Resource Strain: Low Risk  (04/27/2023)   Overall Financial Resource Strain (CARDIA)    Difficulty of Paying Living Expenses: Not hard at all  Food Insecurity: No Food Insecurity (04/27/2023)   Hunger Vital Sign    Worried About Running Out of Food in the Last Year: Never true    Ran Out of Food in the Last Year: Never true  Transportation Needs: No Transportation Needs (04/27/2023)   PRAPARE - Administrator, Civil Service (Medical): No    Lack of Transportation (Non-Medical): No  Physical Activity: Sufficiently Active (04/27/2023)   Exercise Vital Sign    Days of Exercise per Week: 6 days    Minutes of Exercise per Session: 60 min  Stress: No Stress Concern Present (04/27/2023)   [employer] of Occupational Health - Occupational Stress Questionnaire    Feeling of Stress : Not at all  Social Connections: Socially Integrated (04/27/2023)   Social Connection and Isolation Panel [NHANES]    Frequency of Communication with Friends and Family: More than three times a week    Frequency of Social Gatherings with Friends and Family: More than three times a week    Attends Religious Services: More than 4 times per year    Active Member of [employer] or Organizations: Yes    Attends Engineer, structural: More than 4 times per year    Marital Status: Married    Tobacco Counseling Counseling given: Not Answered   Clinical Intake:  Pre-visit preparation completed: No  Pain : No/denies pain     BMI - recorded: 28.44 Nutritional Status: BMI 25 -29 Overweight Nutritional Risks: None Diabetes: No  How often do  you need to have someone help you when you read instructions, pamphlets, or other written materials from your doctor or pharmacy?: 1 - Never  Interpreter Needed?: No  Information entered by :: Theresa Mulligan LPN   Activities of Daily Living    04/27/2023   11:18 AM 02/13/2023    1:54 PM  In your present state of health, do you have any difficulty performing the following activities:  Hearing? 0 0  Vision? 0 0  Difficulty concentrating or making decisions? 0 0  Walking or climbing stairs? 0 0  Dressing or bathing? 0 0  Doing errands, shopping? 0 0  Preparing Food and eating ? N   Using the Toilet? N   In the past six months, have you accidently leaked urine? N   Do you have problems with  loss of bowel control? N   Managing your Medications? N   Managing your Finances? N   Housekeeping or managing your Housekeeping? N     Patient Care Team: Alba Cory, MD as PCP - General (Family Medicine)  Indicate any recent Medical Services you may have received from other than [facility] providers in the past year (date may be approximate).     Assessment:   This is a routine wellness examination for [facility].  Hearing/Vision screen Hearing Screening - Comments:: Denies hearing difficulties   Vision Screening - Comments:: Wears rx glasses - up to date with routine eye exams with  [facility]  Dietary issues and exercise activities discussed:     Goals Addressed               This Visit's Progress     To be more like Christ (pt-stated)        Cut down on sweets and be a better wife.       Depression Screen    04/27/2023   11:18 AM 02/13/2023    1:54 PM 12/20/2022    3:25 PM 10/18/2022    1:32 PM 08/15/2022    2:40 PM 06/08/2022    1:23 PM 06/01/2022    8:22 AM  PHQ 2/9 Scores  PHQ - 2 Score 0 1 0 0 0 0 1  PHQ- 9 Score  2 0 0 0      Fall Risk    04/27/2023   11:19 AM 02/13/2023    1:53 PM 12/20/2022    3:25 PM 10/18/2022    1:32 PM 08/15/2022    2:40 PM  Fall Risk    Falls in the past year? 0 0 0 0 0  Number falls in past yr: 0 0 0 0 0  Injury with Fall? 0 0 0 0 0  Risk for fall due to : No Fall Risks No Fall Risks No Fall Risks No Fall Risks No Fall Risks  Follow up Falls prevention discussed Falls prevention discussed Falls prevention discussed;Education provided;Falls evaluation completed Falls prevention discussed;Education provided;Falls evaluation completed Falls prevention discussed    MEDICARE RISK AT HOME:  Medicare Risk at Home - 04/27/23 1125     Any stairs in or around the home? Yes    If so, are there any without handrails? No    Home free of loose throw rugs in walkways, pet beds, electrical cords, etc? Yes    Adequate lighting in your home to reduce risk of falls? Yes    Life alert? No    Use of a cane, walker or w/c? No    Grab bars in the bathroom? No    Shower chair or bench in shower? Yes    Elevated toilet seat or a handicapped toilet? No             TIMED UP AND GO:  Was the test performed?  No    Cognitive Function:        04/27/2023   11:21 AM 03/28/2019    1:26 PM 03/23/2018    1:10 PM 03/20/2017   11:09 AM  6CIT Screen  What Year? 0 points 0 points 0 points 0 points  What month? 0 points 0 points 0 points 0 points  What time? 0 points 0 points 0 points 0 points  Count back from 20 0 points 0 points 0 points 0 points  Months in reverse 0 points 0 points 0 points 0 points  Repeat phrase 0 points 0 points 2 points 6 points  Total Score 0 points 0 points 2 points 6 points    Immunizations Immunization History  Administered Date(s) Administered   Influenza, Seasonal, Injecte, Preservative Fre 10/01/2013   Moderna Sars-Covid-2 Vaccination 10/28/2019, 11/25/2019   PFIZER(Purple Top)SARS-COV-2 Vaccination 07/28/2020   PNEUMOCOCCAL CONJUGATE-20 02/01/2022    TDAP status: Due, Education has been provided regarding the importance of this vaccine. Advised may receive this vaccine at local pharmacy or Health  Dept. Aware to provide a copy of the vaccination record if obtained from local pharmacy or Health Dept. Verbalized acceptance and understanding.    Pneumococcal vaccine status: Up to date  Covid-19 vaccine status: Completed vaccines  Qualifies for Shingles Vaccine? Yes   Zostavax completed No   Shingrix Completed?: No.    Education has been provided regarding the importance of this vaccine. Patient has been advised to call insurance company to determine out of pocket expense if they have not yet received this vaccine. Advised may also receive vaccine at local pharmacy or Health Dept. Verbalized acceptance and understanding.  Screening Tests Health Maintenance  Topic Date Due   DTaP/Tdap/Td (1 - Tdap) Never done   COVID-19 Vaccine (4 - 2023-24 season) 06/03/2022   Zoster Vaccines- Shingrix (1 of 2) 05/13/2023 (Originally 08/20/1965)   MAMMOGRAM  03/21/2024   Medicare Annual Wellness (AWV)  04/26/2024   Colonoscopy  05/25/2027   Pneumonia Vaccine 53+ Years old  Completed   DEXA SCAN  Completed   Hepatitis C Screening  Completed   HPV VACCINES  Aged Out   INFLUENZA VACCINE  Discontinued    Health Maintenance  Health Maintenance Due  Topic Date Due   DTaP/Tdap/Td (1 - Tdap) Never done   COVID-19 Vaccine (4 - 2023-24 season) 06/03/2022    Colorectal cancer screening: Type of screening: Colonoscopy. Completed 05/24/22. Repeat every 5 years  Mammogram status: Completed 03/22/23. Repeat every year  Bone Density status: Completed 12/13/21. Results reflect: Bone density results: OSTEOPOROSIS. Repeat every   years.  Lung Cancer Screening: (Low Dose CT Chest recommended if Age 46-80 years, 20 pack-year currently smoking OR have quit w/in 15years.) does not qualify.     Additional Screening:  Hepatitis C Screening: does qualify; Completed 02/19/13  Vision Screening: Recommended annual ophthalmology exams for early detection of glaucoma and other disorders of the eye. Is the patient  up to date with their annual eye exam?  Yes  Who is the provider or what is the name of the office in which the patient attends annual eye exams? [facility] If pt is not established with a provider, would they like to be referred to a provider to establish care? No .   Dental Screening: Recommended annual dental exams for proper oral hygiene   Community Resource Referral / Chronic Care Management:  CRR required this visit?  No   CCM required this visit?  No     Plan:     I have personally reviewed and noted the following in the patient's chart:   Medical and social history Use of alcohol, tobacco or illicit drugs  Current medications and supplements including opioid prescriptions. Patient is not currently taking opioid prescriptions. Functional ability and status Nutritional status Physical activity Advanced directives List of other physicians Hospitalizations, surgeries, and ER visits in previous 12 months Vitals Screenings to include cognitive, depression, and falls Referrals and appointments  In addition, I have reviewed and discussed with patient certain preventive protocols, quality metrics, and  best practice recommendations. A written personalized care plan for preventive services as well as general preventive health recommendations were provided to patient.     Tillie Rung, LPN   01/09/8118   After Visit Summary: (MyChart) Due to this being a telephonic visit, the after visit summary with patients personalized plan was offered to patient via MyChart   Nurse Notes: None

## 2023-04-27 NOTE — Patient Instructions (Addendum)
[name] , Thank you for taking time to come for your Medicare Wellness Visit. I appreciate your ongoing commitment to your health goals. Please review the following plan we discussed and let me know if I can assist you in the future.   Referrals/Orders/Follow-Ups/Clinician Recommendations:    This is a list of the screening recommended for you and due dates:  Health Maintenance  Topic Date Due   DTaP/Tdap/Td vaccine (1 - Tdap) Never done   COVID-19 Vaccine (4 - 2023-24 season) 06/03/2022   Zoster (Shingles) Vaccine (1 of 2) 05/13/2023*   Mammogram  03/21/2024   Medicare Annual Wellness Visit  04/26/2024   Colon Cancer Screening  05/25/2027   Pneumonia Vaccine  Completed   DEXA scan (bone density measurement)  Completed   Hepatitis C Screening  Completed   HPV Vaccine  Aged Out   Flu Shot  Discontinued  *Topic was postponed. The date shown is not the original due date.    Advanced directives: (Copy Requested) Please bring a copy of your health care power of attorney and living will to the office to be added to your chart at your convenience.  Next Medicare Annual Wellness Visit scheduled for next year: Yes  Preventive Care 77 Years and Older, Female Preventive care refers to lifestyle choices and visits with your health care provider that can promote health and wellness. What does preventive care include? A yearly physical exam. This is also called an annual well check. Dental exams once or twice a year. Routine eye exams. Ask your health care provider how often you should have your eyes checked. Personal lifestyle choices, including: Daily care of your teeth and gums. Regular physical activity. Eating a healthy diet. Avoiding tobacco and drug use. Limiting alcohol use. Practicing safe sex. Taking low-dose aspirin every day. Taking vitamin and mineral supplements as recommended by your health care provider. What happens during an annual well check? The services and  screenings done by your health care provider during your annual well check will depend on your age, overall health, lifestyle risk factors, and family history of disease. Counseling  Your health care provider may ask you questions about your: Alcohol use. Tobacco use. Drug use. Emotional well-being. Home and relationship well-being. Sexual activity. Eating habits. History of falls. Memory and ability to understand (cognition). Work and work Astronomer. Reproductive health. Screening  You may have the following tests or measurements: Height, weight, and BMI. Blood pressure. Lipid and cholesterol levels. These may be checked every 5 years, or more frequently if you are over 62 years old. Skin check. Lung cancer screening. You may have this screening every year starting at age 41 if you have a 30-pack-year history of smoking and currently smoke or have quit within the past 15 years. Fecal occult blood test (FOBT) of the stool. You may have this test every year starting at age 59. Flexible sigmoidoscopy or colonoscopy. You may have a sigmoidoscopy every 5 years or a colonoscopy every 10 years starting at age 53. Hepatitis C blood test. Hepatitis B blood test. Sexually transmitted disease (STD) testing. Diabetes screening. This is done by checking your blood sugar (glucose) after you have not eaten for a while (fasting). You may have this done every 1-3 years. Bone density scan. This is done to screen for osteoporosis. You may have this done starting at age 27. Mammogram. This may be done every 1-2 years. Talk to your health care provider about how often you should have regular mammograms. Talk with  your health care provider about your test results, treatment options, and if necessary, the need for more tests. Vaccines  Your health care provider may recommend certain vaccines, such as: Influenza vaccine. This is recommended every year. Tetanus, diphtheria, and acellular pertussis (Tdap,  Td) vaccine. You may need a Td booster every 10 years. Zoster vaccine. You may need this after age 38. Pneumococcal 13-valent conjugate (PCV13) vaccine. One dose is recommended after age 31. Pneumococcal polysaccharide (PPSV23) vaccine. One dose is recommended after age 8. Talk to your health care provider about which screenings and vaccines you need and how often you need them. This information is not intended to replace advice given to you by your health care provider. Make sure you discuss any questions you have with your health care provider. Document Released: 10/16/2015 Document Revised: 06/08/2016 Document Reviewed: 07/21/2015 Elsevier Interactive Patient Education  2017 [employer].  Fall Prevention in the Home Falls can cause injuries. They can happen to people of all ages. There are many things you can do to make your home safe and to help prevent falls. What can I do on the outside of my home? Regularly fix the edges of walkways and driveways and fix any cracks. Remove anything that might make you trip as you walk through a door, such as a raised step or threshold. Trim any bushes or trees on the path to your home. Use bright outdoor lighting. Clear any walking paths of anything that might make someone trip, such as rocks or tools. Regularly check to see if handrails are loose or broken. Make sure that both sides of any steps have handrails. Any raised decks and porches should have guardrails on the edges. Have any leaves, snow, or ice cleared regularly. Use sand or salt on walking paths during winter. Clean up any spills in your garage right away. This includes oil or grease spills. What can I do in the bathroom? Use night lights. Install grab bars by the toilet and in the tub and shower. Do not use towel bars as grab bars. Use non-skid mats or decals in the tub or shower. If you need to sit down in the shower, use a plastic, non-slip stool. Keep the floor dry. Clean up any  water that spills on the floor as soon as it happens. Remove soap buildup in the tub or shower regularly. Attach bath mats securely with double-sided non-slip rug tape. Do not have throw rugs and other things on the floor that can make you trip. What can I do in the bedroom? Use night lights. Make sure that you have a light by your bed that is easy to reach. Do not use any sheets or blankets that are too big for your bed. They should not hang down onto the floor. Have a firm chair that has side arms. You can use this for support while you get dressed. Do not have throw rugs and other things on the floor that can make you trip. What can I do in the kitchen? Clean up any spills right away. Avoid walking on wet floors. Keep items that you use a lot in easy-to-reach places. If you need to reach something above you, use a strong step stool that has a grab bar. Keep electrical cords out of the way. Do not use floor polish or wax that makes floors slippery. If you must use wax, use non-skid floor wax. Do not have throw rugs and other things on the floor that can make you trip.  What can I do with my stairs? Do not leave any items on the stairs. Make sure that there are handrails on both sides of the stairs and use them. Fix handrails that are broken or loose. Make sure that handrails are as long as the stairways. Check any carpeting to make sure that it is firmly attached to the stairs. Fix any carpet that is loose or worn. Avoid having throw rugs at the top or bottom of the stairs. If you do have throw rugs, attach them to the floor with carpet tape. Make sure that you have a light switch at the top of the stairs and the bottom of the stairs. If you do not have them, ask someone to add them for you. What else can I do to help prevent falls? Wear shoes that: Do not have high heels. Have rubber bottoms. Are comfortable and fit you well. Are closed at the toe. Do not wear sandals. If you use a  stepladder: Make sure that it is fully opened. Do not climb a closed stepladder. Make sure that both sides of the stepladder are locked into place. Ask someone to hold it for you, if possible. Clearly mark and make sure that you can see: Any grab bars or handrails. First and last steps. Where the edge of each step is. Use tools that help you move around (mobility aids) if they are needed. These include: Canes. Walkers. Scooters. Crutches. Turn on the lights when you go into a dark area. Replace any light bulbs as soon as they burn out. Set up your furniture so you have a clear path. Avoid moving your furniture around. If any of your floors are uneven, fix them. If there are any pets around you, be aware of where they are. Review your medicines with your doctor. Some medicines can make you feel dizzy. This can increase your chance of falling. Ask your doctor what other things that you can do to help prevent falls. This information is not intended to replace advice given to you by your health care provider. Make sure you discuss any questions you have with your health care provider. Document Released: 07/16/2009 Document Revised: 02/25/2016 Document Reviewed: 10/24/2014 Elsevier Interactive Patient Education  2017 [employer].

## 2023-05-02 ENCOUNTER — Ambulatory Visit: Payer: PRIVATE HEALTH INSURANCE

## 2023-05-17 ENCOUNTER — Ambulatory Visit: Payer: PRIVATE HEALTH INSURANCE | Admitting: Family Medicine

## 2023-05-17 NOTE — Progress Notes (Unsigned)
Name: Paula Mills   MRN: [id number]    DOB: Sep 22, 1946   Date:05/18/2023       Progress Note  Subjective  Chief Complaint  Follow Up  HPI  Atherosclerosis or Aorta/carotid atherosclerosis/Hyperlipidemia/PAD: she states dyslipidemia occurs in her family but mother was 95 and died from aneurysm of brain. Father died at 62 from complications of MS. She refuses to take medication. Unchanged    Osteoporosis: explained the results from last year, getting worse, she is Alendronate but not taking every week. We discussed importance of taking it daily but after she read possible side effects  she decided to stop taking it all together . Discussed importance of high calcium diet , vitamin D supplementation and consider seeing Endo for other options - such as Reclast, Forteo, Prolia or Evista, she does not want to pursue that   PAD: she had a screen ABI that was abnormal, denies claudication and not interested in statin therapy. She was seen by vascular surgeon and was advised to wear compression stocking hoses   DDD lumbar spine with radiculitis: seen by [facility] , had MRI lumbar spine that showed disc bulging and DDD,  she had PT and it helped with symptoms, radiculitis on right side improved with steroid injection, however now she has pain on left lower back but pain on right lower leg is back and will have another round of injection tomorrow. She takes Lyrica but does not seem to help   OA knee : doing well now, takes tylenol prn    Patient Active Problem List   Diagnosis Date Noted   PAD (peripheral artery disease) (HCC) 05/18/2023   Back pain with radiculopathy 05/18/2023   Primary osteoarthritis of both hips 05/18/2023   Degenerative disc disease, lumbar 02/13/2023   Varicose veins of right calf 12/21/2022   History of colonic polyps    Screen for colon cancer    Benign neoplasm of ascending colon    Polyp of sigmoid colon    Carotid artery calcification, bilateral 10/18/2016   At  risk for falling 04/08/2016   Hypo-ovarianism 04/08/2016   Asymptomatic varicose veins 04/08/2016   Hiatal hernia 02/09/2016   Liver cyst 02/09/2016   Lumbar spondylosis 02/09/2016   Osteoporosis 02/09/2016   Dyslipidemia 02/09/2016   Vegetarian 02/09/2016   Vitamin D deficiency 02/09/2016   Calcification of abdominal aorta (HCC) 02/09/2016   History of small bowel obstruction 03/12/2015    Past Surgical History:  Procedure Laterality Date   ABDOMINAL HYSTERECTOMY     APPENDECTOMY N/A 03/14/2015   Procedure: APPENDECTOMY;  Surgeon: Duwaine Maxin, MD;  Location: [facility]S;  Service: General;  Laterality: N/A;   CESAREAN SECTION     COLONOSCOPY WITH PROPOFOL N/A 01/31/2017   Procedure: COLONOSCOPY WITH PROPOFOL;  Surgeon: Midge Minium, MD;  Location: [facility] ENDOSCOPY;  Service: Endoscopy;  Laterality: N/A;   COLONOSCOPY WITH PROPOFOL N/A 05/24/2022   Procedure: COLONOSCOPY WITH PROPOFOL;  Surgeon: Midge Minium, MD;  Location: [facility] ENDOSCOPY;  Service: Endoscopy;  Laterality: N/A;   LAPAROTOMY N/A 03/14/2015   Procedure: EXPLORATORY LAPAROTOMY;  Surgeon: Duwaine Maxin, MD;  Location: [facility] ORS;  Service: General;  Laterality: N/A;    Family History  Problem Relation Age of Onset   Hyperlipidemia Mother    Hypertension Mother    Dementia Mother    Multiple sclerosis Father    Hypertension Father    Hypertension Sister    Hyperlipidemia Sister    Multiple sclerosis Sister     Social  History   Tobacco Use   Smoking status: Never   Smokeless tobacco: Never  Substance Use Topics   Alcohol use: Not Currently    Alcohol/week: 0.0 standard drinks of alcohol     Current Outpatient Medications:    Cholecalciferol (VITAMIN D) 2000 units CAPS, Take 2,000 capsules by mouth daily., Disp: , Rfl:    COD LIVER OIL PO, Take by mouth., Disp: , Rfl:    lidocaine (LIDODERM) 5 %, Place 1 patch onto the skin daily. Remove & Discard patch within 12 hours or as directed by MD, Disp: 30 patch,  Rfl: 2   Multiple Vitamins-Minerals (WOMENS MULTIVITAMIN PO), Take 1 capsule by mouth daily. , Disp: , Rfl:    pregabalin (LYRICA) 50 MG capsule, Take 50 mg by mouth 2 (two) times daily., Disp: , Rfl:    tizanidine (ZANAFLEX) 2 MG capsule, Take 2 mg by mouth 3 (three) times daily., Disp: , Rfl:    vitamin B-12 (CYANOCOBALAMIN) 500 MCG tablet, Take 1,000 mcg by mouth daily., Disp: , Rfl:   No Known Allergies  I personally reviewed active problem list, medication list, allergies, family history, social history, health maintenance with the patient/caregiver today.   ROS  Constitutional: Negative for fever or weight change.  Respiratory: Negative for cough and shortness of breath.   Cardiovascular: Negative for chest pain or palpitations.  Gastrointestinal: Negative for abdominal pain, no bowel changes.  Musculoskeletal: Negative for gait problem or joint swelling.  Skin: Negative for rash.  Neurological: Negative for dizziness or headache.  No other specific complaints in a complete review of systems (except as listed in HPI above).   Objective  Vitals:   05/18/23 0950  BP: 126/80  Pulse: 89  Resp: 16  SpO2: 94%  Weight: 156 lb (70.8 kg)  Height: 5\' 2"  (1.575 m)    Body mass index is 28.53 kg/m.  Physical Exam  Constitutional: Patient appears well-developed and well-nourished. Obese  No distress.  HEENT: head atraumatic, normocephalic, pupils equal and reactive to light, neck supple Cardiovascular: Normal rate, regular rhythm and normal heart sounds.  No murmur heard. No BLE edema. Pulmonary/Chest: Effort normal and breath sounds normal. No respiratory distress. Abdominal: Soft.  There is no tenderness. Psychiatric: Patient has a normal mood and affect. behavior is normal. Judgment and thought content normal.   Recent Results (from the past 2160 hour(s))  VAS [place] ABI WITH/WO TBI     Status: None   Collection Time: 03/20/23  8:10 AM  Result Value Ref Range   Right ABI  0.99    Left ABI 1.01     PHQ2/9:    05/18/2023    9:50 AM 04/27/2023   11:18 AM 02/13/2023    1:54 PM 12/20/2022    3:25 PM 10/18/2022    1:32 PM  Depression screen PHQ 2/9  Decreased Interest 0 0 0 0 0  Down, Depressed, Hopeless 0 0 1 0 0  PHQ - 2 Score 0 0 1 0 0  Altered sleeping 0  0 0 0  Tired, decreased energy 0  1 0 0  Change in appetite 0  0 0 0  Feeling bad or failure about yourself  0  0 0 0  Trouble concentrating 0  0 0 0  Moving slowly or fidgety/restless 0  0 0 0  Suicidal thoughts 0  0 0 0  PHQ-9 Score 0  2 0 0  Difficult doing work/chores    Not difficult at all Not difficult at  all    phq 9 is negative   Fall Risk:    05/18/2023    9:50 AM 04/27/2023   11:19 AM 02/13/2023    1:53 PM 12/20/2022    3:25 PM 10/18/2022    1:32 PM  Fall Risk   Falls in the past year? 0 0 0 0 0  Number falls in past yr: 0 0 0 0 0  Injury with Fall? 0 0 0 0 0  Risk for fall due to : No Fall Risks No Fall Risks No Fall Risks No Fall Risks No Fall Risks  Follow up Falls prevention discussed Falls prevention discussed Falls prevention discussed Falls prevention discussed;Education provided;Falls evaluation completed Falls prevention discussed;Education provided;Falls evaluation completed      Functional Status Survey: Is the patient deaf or have difficulty hearing?: No Does the patient have difficulty seeing, even when wearing glasses/contacts?: No Does the patient have difficulty concentrating, remembering, or making decisions?: No Does the patient have difficulty walking or climbing stairs?: Yes Does the patient have difficulty dressing or bathing?: No Does the patient have difficulty doing errands alone such as visiting a doctor's office or shopping?: No    Assessment & Plan  1. Back pain with radiculopathy  - lidocaine (LIDODERM) 5 %; Place 1 patch onto the skin daily. Remove & Discard patch within 12 hours or as directed by MD  Dispense: 30 patch; Refill: 2  2. PAD  (peripheral artery disease) (HCC)  Seen by vascular surgeon   3. Degenerative disc disease, lumbar  Seeing Ortho   4. Primary osteoarthritis of both hips  Taking Tylenol   5. Age-related osteoporosis without current pathological fracture  High calcium diet and vitamin D supplementation   6. Dyslipidemia  Refuses medication  7. Vitamin D deficiency  Continue supplementation  8. Primary osteoarthritis of both knees    9. Calcification of abdominal aorta (HCC)   Not taking statin by choice

## 2023-05-18 ENCOUNTER — Encounter: Payer: Self-pay | Admitting: Family Medicine

## 2023-05-18 ENCOUNTER — Ambulatory Visit (INDEPENDENT_AMBULATORY_CARE_PROVIDER_SITE_OTHER): Payer: PRIVATE HEALTH INSURANCE | Admitting: Family Medicine

## 2023-05-18 VITALS — BP 126/80 | HR 89 | Resp 16 | Ht 62.0 in | Wt 156.0 lb

## 2023-05-18 DIAGNOSIS — M5410 Radiculopathy, site unspecified: Secondary | ICD-10-CM | POA: Diagnosis not present

## 2023-05-18 DIAGNOSIS — M170 Bilateral primary osteoarthritis of knee: Secondary | ICD-10-CM

## 2023-05-18 DIAGNOSIS — M810 Age-related osteoporosis without current pathological fracture: Secondary | ICD-10-CM

## 2023-05-18 DIAGNOSIS — M160 Bilateral primary osteoarthritis of hip: Secondary | ICD-10-CM | POA: Diagnosis not present

## 2023-05-18 DIAGNOSIS — E559 Vitamin D deficiency, unspecified: Secondary | ICD-10-CM

## 2023-05-18 DIAGNOSIS — I700 Atherosclerosis of aorta: Secondary | ICD-10-CM

## 2023-05-18 DIAGNOSIS — M5136 Other intervertebral disc degeneration, lumbar region: Secondary | ICD-10-CM

## 2023-05-18 DIAGNOSIS — M51369 Other intervertebral disc degeneration, lumbar region without mention of lumbar back pain or lower extremity pain: Secondary | ICD-10-CM

## 2023-05-18 DIAGNOSIS — I739 Peripheral vascular disease, unspecified: Secondary | ICD-10-CM

## 2023-05-18 DIAGNOSIS — E785 Hyperlipidemia, unspecified: Secondary | ICD-10-CM

## 2023-05-18 MED ORDER — LIDOCAINE 5 % EX PTCH: 1.0000 | MEDICATED_PATCH | CUTANEOUS | 2 refills | Status: DC

## 2023-05-19 ENCOUNTER — Telehealth: Payer: Self-pay | Admitting: Family Medicine

## 2023-05-19 NOTE — Telephone Encounter (Signed)
The patient called in stating she spoke to the pharmacy and her prescription for her lidocaine (LIDODERM) 5 % requires a prior authorization. Please assist patient as soon as possible.

## 2023-05-19 NOTE — Telephone Encounter (Signed)
Called patient and notified. Patient gave verbal understanding.

## 2023-06-20 ENCOUNTER — Ambulatory Visit (INDEPENDENT_AMBULATORY_CARE_PROVIDER_SITE_OTHER): Payer: PRIVATE HEALTH INSURANCE | Admitting: Nurse Practitioner

## 2023-10-26 DIAGNOSIS — M792 Neuralgia and neuritis, unspecified: Secondary | ICD-10-CM | POA: Insufficient documentation

## 2023-12-13 ENCOUNTER — Encounter: Payer: Self-pay | Admitting: Family Medicine

## 2023-12-13 ENCOUNTER — Ambulatory Visit (INDEPENDENT_AMBULATORY_CARE_PROVIDER_SITE_OTHER): Payer: PRIVATE HEALTH INSURANCE | Admitting: Family Medicine

## 2023-12-13 VITALS — BP 144/80 | HR 88 | Temp 97.5°F | Resp 16 | Ht 62.0 in | Wt 158.8 lb

## 2023-12-13 DIAGNOSIS — Z789 Other specified health status: Secondary | ICD-10-CM | POA: Diagnosis not present

## 2023-12-13 DIAGNOSIS — E785 Hyperlipidemia, unspecified: Secondary | ICD-10-CM

## 2023-12-13 DIAGNOSIS — R9431 Abnormal electrocardiogram [ECG] [EKG]: Secondary | ICD-10-CM

## 2023-12-13 DIAGNOSIS — R739 Hyperglycemia, unspecified: Secondary | ICD-10-CM

## 2023-12-13 DIAGNOSIS — R700 Elevated erythrocyte sedimentation rate: Secondary | ICD-10-CM

## 2023-12-13 DIAGNOSIS — I739 Peripheral vascular disease, unspecified: Secondary | ICD-10-CM

## 2023-12-13 DIAGNOSIS — M5416 Radiculopathy, lumbar region: Secondary | ICD-10-CM

## 2023-12-13 DIAGNOSIS — M2560 Stiffness of unspecified joint, not elsewhere classified: Secondary | ICD-10-CM

## 2023-12-13 DIAGNOSIS — R030 Elevated blood-pressure reading, without diagnosis of hypertension: Secondary | ICD-10-CM

## 2023-12-13 DIAGNOSIS — I700 Atherosclerosis of aorta: Secondary | ICD-10-CM

## 2023-12-13 DIAGNOSIS — M810 Age-related osteoporosis without current pathological fracture: Secondary | ICD-10-CM

## 2023-12-13 NOTE — Addendum Note (Signed)
 Addended by: Forde Radon on: 12/13/2023 11:42 AM   Modules accepted: Orders

## 2023-12-13 NOTE — Progress Notes (Addendum)
 Name: Paula Mills   MRN: [id number]    DOB: 05-28-1946   Date:12/13/2023       Progress Note  Subjective  Chief Complaint  Chief Complaint  Patient presents with   Medical Management of Chronic Issues   HPI   Atherosclerosis or Aorta/carotid atherosclerosis/Hyperlipidemia/PAD: she states dyslipidemia occurs in her family but mother was 74 and died from aneurysm of brain. Father died at 59 from complications of MS. She refuses to take medication. Unchanged    Osteoporosis: explained the results from last year, getting worse, she is Alendronate but not taking every week. We discussed importance of taking it daily but after she read possible side effects  she decided to stop taking it all together . Discussed importance of high calcium diet , vitamin D supplementation and consider seeing Endo for other options - such as Reclast, Forteo, Prolia or Evista, she continues to refused medication    PAD: she had a screen ABI that was abnormal, denies claudication and not interested in statin therapy. She was seen by vascular surgeon and was advised to wear compression stocking hoses on right leg due to varicose veins. Up to date with follow up.   Pre diabetes: A1C went up to 6.1 %, she is a vegetarian, she denies polyphagia, polydipsia or polyuria  Elevated sed rate and stiffness: all joints, negative ANA panel, and normal CRP , we will recheck labs today  DDD lumbar spine with radiculitis: seen by [facility] , had MRI lumbar spine that showed disc bulging and DDD,  she had PT and it helped with symptoms, radiculitis on right side that improves temporarily . The improvement is minimal. Discussed getting a second opinion and consider surgery since the pain is now affecting her qualify of life and emotional health.    OA knee : doing well now, takes tylenol prn . She has hammer toes and bunions   Elevated BP: she states bp has been high at home for the past two weeks, today also a little elevated.  She is worried about her health. She is frustrated about aging symptoms. No chest pain or sob, no decrease in exercising tolerance.    Patient Active Problem List   Diagnosis Date Noted   PAD (peripheral artery disease) (HCC) 05/18/2023   Back pain with radiculopathy 05/18/2023   Primary osteoarthritis of both hips 05/18/2023   Degenerative disc disease, lumbar 02/13/2023   Varicose veins of right calf 12/21/2022   History of colonic polyps    Benign neoplasm of ascending colon    Polyp of sigmoid colon    Carotid artery calcification, bilateral 10/18/2016   Hypo-ovarianism 04/08/2016   Asymptomatic varicose veins 04/08/2016   Hiatal hernia 02/09/2016   Liver cyst 02/09/2016   Lumbar spondylosis 02/09/2016   Osteoporosis 02/09/2016   Dyslipidemia 02/09/2016   Vegetarian 02/09/2016   Vitamin D deficiency 02/09/2016   Calcification of abdominal aorta (HCC) 02/09/2016   History of small bowel obstruction 03/12/2015    Past Surgical History:  Procedure Laterality Date   ABDOMINAL HYSTERECTOMY     APPENDECTOMY N/A 03/14/2015   Procedure: APPENDECTOMY;  Surgeon: Duwaine Maxin, MD;  Location: [facility] ORS;  Service: General;  Laterality: N/A;   CESAREAN SECTION     COLONOSCOPY WITH PROPOFOL N/A 01/31/2017   Procedure: COLONOSCOPY WITH PROPOFOL;  Surgeon: Midge Minium, MD;  Location: [facility] ENDOSCOPY;  Service: Endoscopy;  Laterality: N/A;   COLONOSCOPY WITH PROPOFOL N/A 05/24/2022   Procedure: COLONOSCOPY WITH PROPOFOL;  Surgeon: Midge Minium,  MD;  Location: [facility] ENDOSCOPY;  Service: Endoscopy;  Laterality: N/A;   LAPAROTOMY N/A 03/14/2015   Procedure: EXPLORATORY LAPAROTOMY;  Surgeon: Duwaine Maxin, MD;  Location: [facility] ORS;  Service: General;  Laterality: N/A;    Family History  Problem Relation Age of Onset   Hyperlipidemia Mother    Hypertension Mother    Dementia Mother    Multiple sclerosis Father    Hypertension Father    Hypertension Sister    Hyperlipidemia Sister     Multiple sclerosis Sister     Social History   Tobacco Use   Smoking status: Never   Smokeless tobacco: Never  Substance Use Topics   Alcohol use: Not Currently    Alcohol/week: 0.0 standard drinks of alcohol     Current Outpatient Medications:    Cholecalciferol (VITAMIN D) 2000 units CAPS, Take 2,000 capsules by mouth daily., Disp: , Rfl:    COD LIVER OIL PO, Take by mouth., Disp: , Rfl:    lidocaine (LIDODERM) 5 %, Place 1 patch onto the skin daily. Remove & Discard patch within 12 hours or as directed by MD, Disp: 30 patch, Rfl: 2   Multiple Vitamins-Minerals (WOMENS MULTIVITAMIN PO), Take 1 capsule by mouth daily. , Disp: , Rfl:    vitamin B-12 (CYANOCOBALAMIN) 500 MCG tablet, Take 1,000 mcg by mouth daily., Disp: , Rfl:   No Known Allergies  I personally reviewed active problem list, medication list, allergies with the patient/caregiver today.   ROS  Ten systems reviewed and is negative except as mentioned in HPI    Objective  Vitals:   12/13/23 1039 12/13/23 1140  BP: (!) 148/84 (!) 144/80  Pulse: 88   Resp: 16   Temp: (!) 97.5 F (36.4 C)   TempSrc: Oral   SpO2: 95%   Weight: 158 lb 12.8 oz (72 kg)   Height: 5\' 2"  (1.575 m)     Body mass index is 29.04 kg/m.  Physical Exam  Constitutional: Patient appears well-developed and well-nourished.  No distress.  HEENT: head atraumatic, normocephalic, pupils equal and reactive to light, neck supple Cardiovascular: Normal rate, extra beats noticed No murmur heard. No BLE edema. Pulmonary/Chest: Effort normal and breath sounds normal. No respiratory distress. Abdominal: Soft.  There is no tenderness. Psychiatric: Patient has a normal mood and affect. behavior is normal. Judgment and thought content normal.   Diabetic Foot Exam:     PHQ2/9:    12/13/2023   10:37 AM 05/18/2023    9:50 AM 04/27/2023   11:18 AM 02/13/2023    1:54 PM 12/20/2022    3:25 PM  Depression screen PHQ 2/9  Decreased Interest 1 0 0 0  0  Down, Depressed, Hopeless 1 0 0 1 0  PHQ - 2 Score 2 0 0 1 0  Altered sleeping 1 0  0 0  Tired, decreased energy 1 0  1 0  Change in appetite 0 0  0 0  Feeling bad or failure about yourself  0 0  0 0  Trouble concentrating 1 0  0 0  Moving slowly or fidgety/restless 0 0  0 0  Suicidal thoughts 0 0  0 0  PHQ-9 Score 5 0  2 0  Difficult doing work/chores Somewhat difficult    Not difficult at all    phq 9 is positive  Fall Risk:    12/13/2023   10:37 AM 05/18/2023    9:50 AM 04/27/2023   11:19 AM 02/13/2023    1:53 PM 12/20/2022  3:25 PM  Fall Risk   Falls in the past year? 0 0 0 0 0  Number falls in past yr: 0 0 0 0 0  Injury with Fall? 0 0 0 0 0  Risk for fall due to : No Fall Risks No Fall Risks No Fall Risks No Fall Risks No Fall Risks  Follow up Falls prevention discussed;Education provided;Falls evaluation completed Falls prevention discussed Falls prevention discussed Falls prevention discussed Falls prevention discussed;Education provided;Falls evaluation completed     Assessment & Plan  1. Lumbar back pain with radiculopathy affecting right lower extremity (Primary)  - Ambulatory referral to Neurosurgery  2. Vegetarian diet  - B12 and Folate Panel  3. Elevated sed rate  - Sedimentation rate - C-reactive protein  4. Dyslipidemia  - Lipid panel  5. Age-related osteoporosis without current pathological fracture  - COMPLETE METABOLIC PANEL WITH GFR - CBC with Differential/Platelet  6. PAD (peripheral artery disease) (HCC)  - Lipid panel - COMPLETE METABOLIC PANEL WITH GFR  7. Calcification of abdominal aorta (HCC)  - Lipid panel - COMPLETE METABOLIC PANEL WITH GFR  8. Hyperglycemia  - Hemoglobin A1c  9. Morning stiffness of joints  - Sedimentation rate - C-reactive protein  10. Elevated blood pressure reading without diagnosis of hypertension  We will monitor, she does not want medication, discussed mindfulness    - EKG 12-Lead -  Ambulatory referral to Cardiology  11. Abnormal EKG  - Ambulatory referral to Cardiology

## 2023-12-13 NOTE — Addendum Note (Signed)
 Addended by: Alba Cory F on: 12/13/2023 12:06 PM   Modules accepted: Orders

## 2023-12-14 ENCOUNTER — Encounter: Payer: Self-pay | Admitting: Family Medicine

## 2023-12-14 LAB — CBC WITH DIFFERENTIAL/PLATELET
Absolute Lymphocytes: 1284 {cells}/uL (ref 850–3900)
Absolute Monocytes: 473 {cells}/uL (ref 200–950)
Basophils Absolute: 21 {cells}/uL (ref 0–200)
Basophils Relative: 0.4 %
Eosinophils Absolute: 62 {cells}/uL (ref 15–500)
Eosinophils Relative: 1.2 %
HCT: 36.6 % (ref 35.0–45.0)
Hemoglobin: 11.7 g/dL (ref 11.7–15.5)
MCH: 27.8 pg (ref 27.0–33.0)
MCHC: 32 g/dL (ref 32.0–36.0)
MCV: 86.9 fL (ref 80.0–100.0)
MPV: 11.3 fL (ref 7.5–12.5)
Monocytes Relative: 9.1 %
Neutro Abs: 3359 {cells}/uL (ref 1500–7800)
Neutrophils Relative %: 64.6 %
Platelets: 276 10*3/uL (ref 140–400)
RBC: 4.21 10*6/uL (ref 3.80–5.10)
RDW: 13 % (ref 11.0–15.0)
Total Lymphocyte: 24.7 %
WBC: 5.2 10*3/uL (ref 3.8–10.8)

## 2023-12-14 LAB — LIPID PANEL
Cholesterol: 294 mg/dL — ABNORMAL HIGH (ref <200)
HDL: 79 mg/dL (ref >=50)
LDL Cholesterol (Calc): 195 mg/dL — ABNORMAL HIGH
Non-HDL Cholesterol (Calc): 215 mg/dL — ABNORMAL HIGH (ref <130)
Total CHOL/HDL Ratio: 3.7 (calc) (ref <5.0)
Triglycerides: 89 mg/dL (ref <150)

## 2023-12-14 LAB — SEDIMENTATION RATE: Sed Rate: 36 mm/h — ABNORMAL HIGH (ref 0–30)

## 2023-12-14 LAB — COMPLETE METABOLIC PANEL WITH GFR
AG Ratio: 1.3 (calc) (ref 1.0–2.5)
ALT: 20 U/L (ref 6–29)
AST: 22 U/L (ref 10–35)
Albumin: 4.3 g/dL (ref 3.6–5.1)
Alkaline phosphatase (APISO): 77 U/L (ref 37–153)
BUN: 10 mg/dL (ref 7–25)
CO2: 27 mmol/L (ref 20–32)
Calcium: 9.3 mg/dL (ref 8.6–10.4)
Chloride: 104 mmol/L (ref 98–110)
Creat: 0.62 mg/dL (ref 0.60–1.00)
Globulin: 3.4 g/dL (ref 1.9–3.7)
Glucose, Bld: 87 mg/dL (ref 65–99)
Potassium: 3.8 mmol/L (ref 3.5–5.3)
Sodium: 140 mmol/L (ref 135–146)
Total Bilirubin: 0.5 mg/dL (ref 0.2–1.2)
Total Protein: 7.7 g/dL (ref 6.1–8.1)
eGFR: 91 mL/min/{1.73_m2} (ref >=60)

## 2023-12-14 LAB — B12 AND FOLATE PANEL
Folate: >24 ng/mL
Vitamin B-12: 673 pg/mL (ref 200–1100)

## 2023-12-14 LAB — C-REACTIVE PROTEIN: CRP: <3 mg/L (ref <8.0)

## 2023-12-14 LAB — HEMOGLOBIN A1C
Hgb A1c MFr Bld: 6 %{Hb} — ABNORMAL HIGH (ref <5.7)
Mean Plasma Glucose: 126 mg/dL
eAG (mmol/L): 7 mmol/L

## 2023-12-22 ENCOUNTER — Telehealth: Payer: Self-pay | Admitting: Family Medicine

## 2023-12-22 NOTE — Telephone Encounter (Signed)
 Called pt back no answer left vm stating she needs to reach out back to Neurosurgery due to on referral notes they are the ones requiring the imaging.

## 2023-12-22 NOTE — Telephone Encounter (Signed)
 Copied from CRM [id number]. Topic: Clinical - Lab/Test Results >> Dec 22, 2023  2:38 PM Victorino Dike T wrote: Reason for CRM: patient did the request for MRI to be sent to the cardio doctor- Patient [phone]- wants to see a heart doctor sooner than May and ready to schedule for the neurosurgeon

## 2023-12-27 ENCOUNTER — Ambulatory Visit (INDEPENDENT_AMBULATORY_CARE_PROVIDER_SITE_OTHER): Payer: PRIVATE HEALTH INSURANCE | Admitting: Family Medicine

## 2023-12-27 ENCOUNTER — Encounter: Payer: Self-pay | Admitting: Family Medicine

## 2023-12-27 ENCOUNTER — Ambulatory Visit: Payer: PRIVATE HEALTH INSURANCE

## 2023-12-27 VITALS — BP 148/84 | HR 71 | Resp 16 | Ht 62.0 in | Wt 162.0 lb

## 2023-12-27 DIAGNOSIS — I10 Essential (primary) hypertension: Secondary | ICD-10-CM | POA: Diagnosis not present

## 2023-12-27 DIAGNOSIS — I498 Other specified cardiac arrhythmias: Secondary | ICD-10-CM

## 2023-12-27 DIAGNOSIS — N611 Abscess of the breast and nipple: Secondary | ICD-10-CM | POA: Diagnosis not present

## 2023-12-27 MED ORDER — SULFAMETHOXAZOLE-TRIMETHOPRIM 800-160 MG PO TABS: 1.0000 | ORAL_TABLET | Freq: Two times a day (BID) | ORAL | 0 refills | Status: DC

## 2023-12-27 NOTE — Patient Instructions (Addendum)
 Contains text generated by [employer].                         DASH Eating Plan DASH stands for Dietary Approaches to Stop Hypertension. The DASH eating plan is a healthy eating plan that has been shown to: Lower high blood pressure (hypertension). Reduce your risk for type 2 diabetes, heart disease, and stroke. Help with weight loss. What are tips for following this plan? Reading food labels Check food labels for the amount of salt (sodium) per serving. Choose foods with less than 5 percent of the Daily Value (DV) of sodium. In general, foods with less than 300 milligrams (mg) of sodium per serving fit into this eating plan. To find whole grains, look for the word "whole" as the first word in the ingredient list. Shopping Buy products labeled as "low-sodium" or "no salt added." Buy fresh foods. Avoid canned foods and pre-made or frozen meals. Cooking Try not to add salt when you cook. Use salt-free seasonings or herbs instead of table salt or sea salt. Check with your health care provider or pharmacist before using salt substitutes. Do not fry foods. Cook foods in healthy ways, such as baking, boiling, grilling, roasting, or broiling. Cook using oils that are good for your heart. These include olive, canola, avocado, soybean, and sunflower oil. Meal planning  Eat a balanced diet. This should include: 4 or more servings of fruits and 4 or more servings of vegetables each day. Try to fill half of your plate with fruits and vegetables. 6-8 servings of whole grains each day. 6 or less servings of lean meat, poultry, or fish each day. 1 oz is 1 serving. A 3 oz (85 g) serving of meat is about the same size as the palm of your hand. One egg is 1 oz (28 g). 2-3 servings of low-fat dairy each day. One serving is 1 cup (237 mL). 1 serving of nuts, seeds, or beans 5 times each week. 2-3 servings of heart-healthy fats. Healthy fats  called omega-3 fatty acids are found in foods such as walnuts, flaxseeds, fortified milks, and eggs. These fats are also found in cold-water fish, such as sardines, salmon, and mackerel. Limit how much you eat of: Canned or prepackaged foods. Food that is high in trans fat, such as fried foods. Food that is high in saturated fat, such as fatty meat. Desserts and other sweets, sugary drinks, and other foods with added sugar. Full-fat dairy products. Do not salt foods before eating. Do not eat more than 4 egg yolks a week. Try to eat at least 2 vegetarian meals a week. Eat more home-cooked food and less restaurant, buffet, and fast food. Lifestyle When eating at a restaurant, ask if your food can be made with less salt or no salt. If you drink alcohol: Limit how much you have to: 0-1 drink a day if you are female. 0-2 drinks a day if you are female. Know how much alcohol is in your drink. In the [place] one drink is one 12 oz bottle of beer (355 mL), one 5 oz glass of wine (148 mL), or one 1 oz glass of hard liquor (44 mL). General information Avoid eating more than 2,300 mg of salt a day. If you have hypertension, you may need to reduce your sodium intake to 1,500  mg a day. Work with your provider to stay at a healthy body weight or lose weight. Ask what the best weight range is for you. On most days of the week, get at least 30 minutes of exercise that causes your heart to beat faster. This may include walking, swimming, or biking. Work with your provider or dietitian to adjust your eating plan to meet your specific calorie needs. What foods should I eat? Fruits All fresh, dried, or frozen fruit. Canned fruits that are in their natural juice and do not have sugar added to them. Vegetables Fresh or frozen vegetables that are raw, steamed, roasted, or grilled. Low-sodium or reduced-sodium tomato and vegetable juice. Low-sodium or reduced-sodium tomato sauce and tomato paste. Low-sodium or  reduced-sodium canned vegetables. Grains Whole-grain or whole-wheat bread. Whole-grain or whole-wheat pasta. Brown rice. Orpah Cobb. Bulgur. Whole-grain and low-sodium cereals. Pita bread. Low-fat, low-sodium crackers. Whole-wheat flour tortillas. Meats and other proteins Skinless chicken or [place]. Ground chicken or [place]. Pork with fat trimmed off. Fish and seafood. Egg whites. Dried beans, peas, or lentils. Unsalted nuts, nut butters, and seeds. Unsalted canned beans. Lean cuts of beef with fat trimmed off. Low-sodium, lean precooked or cured meat, such as sausages or meat loaves. Dairy Low-fat (1%) or fat-free (skim) milk. Reduced-fat, low-fat, or fat-free cheeses. Nonfat, low-sodium ricotta or cottage cheese. Low-fat or nonfat yogurt. Low-fat, low-sodium cheese. Fats and oils Soft margarine without trans fats. Vegetable oil. Reduced-fat, low-fat, or light mayonnaise and salad dressings (reduced-sodium). Canola, safflower, olive, avocado, soybean, and sunflower oils. Avocado. Seasonings and condiments Herbs. Spices. Seasoning mixes without salt. Other foods Unsalted popcorn and pretzels. Fat-free sweets. The items listed above may not be all the foods and drinks you can have. Talk to a dietitian to learn more. What foods should I avoid? Fruits Canned fruit in a light or heavy syrup. Fried fruit. Fruit in cream or butter sauce. Vegetables Creamed or fried vegetables. Vegetables in a cheese sauce. Regular canned vegetables that are not marked as low-sodium or reduced-sodium. Regular canned tomato sauce and paste that are not marked as low-sodium or reduced-sodium. Regular tomato and vegetable juices that are not marked as low-sodium or reduced-sodium. Rosita Fire. Olives. Grains Baked goods made with fat, such as croissants, muffins, or some breads. Dry pasta or rice meal packs. Meats and other proteins Fatty cuts of meat. Ribs. Fried meat. Tomasa Blase. Bologna, salami, and other precooked or  cured meats, such as sausages or meat loaves, that are not lean and low in sodium. Fat from the back of a pig (fatback). Bratwurst. Salted nuts and seeds. Canned beans with added salt. Canned or smoked fish. Whole eggs or egg yolks. Chicken or [place] with skin. Dairy Whole or 2% milk, cream, and half-and-half. Whole or full-fat cream cheese. Whole-fat or sweetened yogurt. Full-fat cheese. Nondairy creamers. Whipped toppings. Processed cheese and cheese spreads. Fats and oils Butter. Stick margarine. Lard. Shortening. Ghee. Bacon fat. Tropical oils, such as coconut, palm kernel, or palm oil. Seasonings and condiments Onion salt, garlic salt, seasoned salt, table salt, and sea salt. Worcestershire sauce. Tartar sauce. Barbecue sauce. Teriyaki sauce. Soy sauce, including reduced-sodium soy sauce. Steak sauce. Canned and packaged gravies. Fish sauce. Oyster sauce. Cocktail sauce. Store-bought horseradish. Ketchup. Mustard. Meat flavorings and tenderizers. Bouillon cubes. Hot sauces. Pre-made or packaged marinades. Pre-made or packaged taco seasonings. Relishes. Regular salad dressings. Other foods Salted popcorn and pretzels. The items listed above may not be all the foods and drinks you should avoid. Talk to  a dietitian to learn more. Where to find more information National Heart, Lung, and Blood Institute (NHLBI): BuffaloDryCleaner.gl American Heart Association (AHA): heart.org Academy of Nutrition and Dietetics: eatright.org National Kidney Foundation (NKF): kidney.org This information is not intended to replace advice given to you by your health care provider. Make sure you discuss any questions you have with your health care provider. Document Revised: 10/06/2022 Document Reviewed: 10/06/2022 Elsevier Patient Education  2024 [employer].

## 2023-12-27 NOTE — Progress Notes (Signed)
 Name: Paula Mills   MRN: [id number]    DOB: [birth year]   Date:12/27/2023       Progress Note  Subjective  Chief Complaint  Chief Complaint  Patient presents with   Breast Mass   Discussed the use of AI scribe software for clinical note transcription with the patient, who gave verbal consent to proceed.  History of Present Illness Paula Mills is a 78 year old female who presents with a breast abscess and follow-up for cardiac evaluation. She is accompanied by her husband.  She has a breast abscess located on her right breast near the sternum, which she noticed about a week ago. Initially larger, it began draining approximately five days ago after she applied warm compresses. There is pain only upon touching the area. She continues to use warm compresses to aid drainage. No fever or chills are present.  She is following up for cardiac concerns after experiencing her heart 'skipping beats' and elevated blood pressure during a previous visit on March 12. An EKG at that time showed premature atrial complexes. No chest pain, palpitations, or shortness of breath are reported. She is awaiting a cardiology consultation for further evaluation.  Her blood pressure has been elevated during her last two visits. She monitors her blood pressure at home, noting fluctuations with some days being normal and others elevated. She is reluctant to start medication for hypertension at this time.  She is a vegetarian and maintains an active lifestyle.    Patient Active Problem List   Diagnosis Date Noted   PAD (peripheral artery disease) (HCC) 05/18/2023   Back pain with radiculopathy 05/18/2023   Primary osteoarthritis of both hips 05/18/2023   Degenerative disc disease, lumbar 02/13/2023   Varicose veins of right calf 12/21/2022   History of colonic polyps    Benign neoplasm of ascending colon    Polyp of sigmoid colon    Carotid artery calcification, bilateral 10/18/2016   Hypo-ovarianism  04/08/2016   Asymptomatic varicose veins 04/08/2016   Hiatal hernia 02/09/2016   Liver cyst 02/09/2016   Lumbar spondylosis 02/09/2016   Osteoporosis 02/09/2016   Dyslipidemia 02/09/2016   Vegetarian 02/09/2016   Vitamin D deficiency 02/09/2016   Calcification of abdominal aorta (HCC) 02/09/2016   History of small bowel obstruction 03/12/2015    Past Surgical History:  Procedure Laterality Date   ABDOMINAL HYSTERECTOMY     APPENDECTOMY N/A 03/14/2015   Procedure: APPENDECTOMY;  Surgeon: Duwaine Maxin, MD;  Location: [facility] ORS;  Service: General;  Laterality: N/A;   CESAREAN SECTION     COLONOSCOPY WITH PROPOFOL N/A 01/31/2017   Procedure: COLONOSCOPY WITH PROPOFOL;  Surgeon: Midge Minium, MD;  Location: [facility] ENDOSCOPY;  Service: Endoscopy;  Laterality: N/A;   COLONOSCOPY WITH PROPOFOL N/A 05/24/2022   Procedure: COLONOSCOPY WITH PROPOFOL;  Surgeon: Midge Minium, MD;  Location: [facility] ENDOSCOPY;  Service: Endoscopy;  Laterality: N/A;   LAPAROTOMY N/A 03/14/2015   Procedure: EXPLORATORY LAPAROTOMY;  Surgeon: Duwaine Maxin, MD;  Location: [facility] ORS;  Service: General;  Laterality: N/A;    Family History  Problem Relation Age of Onset   Hyperlipidemia Mother    Hypertension Mother    Dementia Mother    Multiple sclerosis Father    Hypertension Father    Hypertension Sister    Hyperlipidemia Sister    Multiple sclerosis Sister     Social History   Tobacco Use   Smoking status: Never   Smokeless tobacco: Never  Substance Use Topics   Alcohol use:  Not Currently    Alcohol/week: 0.0 standard drinks of alcohol     Current Outpatient Medications:    Cholecalciferol (VITAMIN D) 2000 units CAPS, Take 2,000 capsules by mouth daily., Disp: , Rfl:    COD LIVER OIL PO, Take by mouth., Disp: , Rfl:    lidocaine (LIDODERM) 5 %, Place 1 patch onto the skin daily. Remove & Discard patch within 12 hours or as directed by MD, Disp: 30 patch, Rfl: 2   Multiple Vitamins-Minerals (WOMENS  MULTIVITAMIN PO), Take 1 capsule by mouth daily. , Disp: , Rfl:    sulfamethoxazole-trimethoprim (BACTRIM DS) 800-160 MG tablet, Take 1 tablet by mouth 2 (two) times daily., Disp: 14 tablet, Rfl: 0   vitamin B-12 (CYANOCOBALAMIN) 500 MCG tablet, Take 1,000 mcg by mouth daily., Disp: , Rfl:   No Known Allergies  I personally reviewed active problem list, medication list, allergies with the patient/caregiver today.   ROS  Ten systems reviewed and is negative except as mentioned in HPI    Objective  Vitals:   12/27/23 1331  BP: (!) 152/86  Pulse: 71  Resp: 16  SpO2: 97%  Weight: 162 lb (73.5 kg)  Height: 5\' 2"  (1.575 m)    Body mass index is 29.63 kg/m.  Physical Exam Physical Exam VITALS: BP- 152/ GENERAL: Alert, cooperative, well developed, no acute distress. HEENT: Normocephalic, normal oropharynx, moist mucous membranes. CHEST: Clear to auscultation bilaterally. No wheezes, rhonchi, or crackles. CARDIOVASCULAR: Normal heart rate but irregular. S1 and S2 normal without murmurs. BREAST: Breasts lumpy in the right and left upper outer quadrants and right lower quadrant. Abscess located between breasts near sternum, smaller than a quarter with induration present. drainage ABDOMEN: Soft, non-tender, non-distended, without organomegaly. Normal bowel sounds. EXTREMITIES: No cyanosis or edema. NEUROLOGICAL: Cranial nerves grossly intact. Moves all extremities without gross motor or sensory deficit.  Recent Results (from the past 2160 hours)  Lipid panel     Status: Abnormal   Collection Time: 12/13/23 11:43 AM  Result Value Ref Range   Cholesterol 294 (H) <200 mg/dL   HDL 79 > OR = 50 mg/dL   Triglycerides 89 <098 mg/dL   LDL Cholesterol (Calc) 195 (H) mg/dL (calc)    Comment: LDL-C levels > or = 190 mg/dL may indicate familial  hypercholesterolemia (FH). Clinical assessment and  measurement of blood lipid levels should be  considered for all first degree relatives of  patients with an FH diagnosis. LDL Cholesterol (LDL-C) levels > or = 300 mg/dL may indicate homozygous familial hypercholesterolemia (HoFH). Untreated,  these extremely high LDL-C levels can result in premature CV events and mortality. Patients should be identified early and provided appropriate interventions to reduce the cumulative LDL-C burden from birth. . For questions about testing for familial hypercholesterolemia, please call Engineer, materials at 1.866.GENE.INFO. Wardell Honour, et al. J National Lipid Association  Recommendations for Patient-Centered Management of Dyslipidemia: Part 1 Journal of  Clinical Lipidology 2015;9(2), 129-169. Cuchel, M. et al. (2014). Homozygous familial hypercholesterolaemia: new insights and guidance for clinicians to impro ve detection and clinical management. European Heart Journal, 35(32), [phone]. Reference range: <100 . Desirable range <100 mg/dL for primary prevention;   <70 mg/dL for patients with CHD or diabetic patients  with > or = 2 CHD risk factors. Marland Kitchen LDL-C is now calculated using the Martin-Hopkins  calculation, which is a validated novel method providing  better accuracy than the Friedewald equation in the  estimation of LDL-C.  Horald Pollen et al.  JAMA. 8119;147(82): [phone]  (http://education.Quest[employer]ostics.com/faq/FAQ164)    Total CHOL/HDL Ratio 3.7 <5.0 (calc)   Non-HDL Cholesterol (Calc) 215 (H) <130 mg/dL (calc)    Comment: For patients with diabetes plus 1 major ASCVD risk  factor, treating to a non-HDL-C goal of <100 mg/dL  (LDL-C of <65 mg/dL) is considered a therapeutic  option.   COMPLETE METABOLIC PANEL WITH GFR     Status: None   Collection Time: 12/13/23 11:43 AM  Result Value Ref Range   Glucose, Bld 87 65 - 99 mg/dL    Comment: .            Fasting reference interval .    BUN 10 7 - 25 mg/dL   Creat 7.84 6.96 - 2.95 mg/dL   eGFR 91 > OR = 60 MW/UXL/2.44W1   BUN/Creatinine Ratio SEE  NOTE: 6 - 22 (calc)    Comment:    Not Reported: BUN and Creatinine are within    reference range. .    Sodium 140 135 - 146 mmol/L   Potassium 3.8 3.5 - 5.3 mmol/L   Chloride 104 98 - 110 mmol/L   CO2 27 20 - 32 mmol/L   Calcium 9.3 8.6 - 10.4 mg/dL   Total Protein 7.7 6.1 - 8.1 g/dL   Albumin 4.3 3.6 - 5.1 g/dL   Globulin 3.4 1.9 - 3.7 g/dL (calc)   AG Ratio 1.3 1.0 - 2.5 (calc)   Total Bilirubin 0.5 0.2 - 1.2 mg/dL   Alkaline phosphatase (APISO) 77 37 - 153 U/L   AST 22 10 - 35 U/L   ALT 20 6 - 29 U/L  CBC with Differential/Platelet     Status: None   Collection Time: 12/13/23 11:43 AM  Result Value Ref Range   WBC 5.2 3.8 - 10.8 Thousand/uL   RBC 4.21 3.80 - 5.10 Million/uL   Hemoglobin 11.7 11.7 - 15.5 g/dL   HCT 02.7 25.3 - 66.4 %   MCV 86.9 80.0 - 100.0 fL   MCH 27.8 27.0 - 33.0 pg   MCHC 32.0 32.0 - 36.0 g/dL    Comment: For adults, a slight decrease in the calculated MCHC value (in the range of 30 to 32 g/dL) is most likely not clinically significant; however, it should be interpreted with caution in correlation with other red cell parameters and the patient's clinical condition.    RDW 13.0 11.0 - 15.0 %   Platelets 276 140 - 400 Thousand/uL   MPV 11.3 7.5 - 12.5 fL   Neutro Abs 3,359 1,500 - 7,800 cells/uL   Absolute Lymphocytes 1,284 850 - 3,900 cells/uL   Absolute Monocytes 473 200 - 950 cells/uL   Eosinophils Absolute 62 15 - 500 cells/uL   Basophils Absolute 21 0 - 200 cells/uL   Neutrophils Relative % 64.6 %   Total Lymphocyte 24.7 %   Monocytes Relative 9.1 %   Eosinophils Relative 1.2 %   Basophils Relative 0.4 %  Hemoglobin A1c     Status: Abnormal   Collection Time: 12/13/23 11:43 AM  Result Value Ref Range   Hgb A1c MFr Bld 6.0 (H) <5.7 % of total Hgb    Comment: For someone without known diabetes, a hemoglobin  A1c value between 5.7% and 6.4% is consistent with prediabetes and should be confirmed with a  follow-up test. . For someone with  known diabetes, a value <7% indicates that their diabetes is well controlled. A1c targets should be individualized based on duration of diabetes, age, comorbid  conditions, and other considerations. . This assay result is consistent with an increased risk of diabetes. . Currently, no consensus exists regarding use of hemoglobin A1c for diagnosis of diabetes for children. .    Mean Plasma Glucose 126 mg/dL   eAG (mmol/L) 7.0 mmol/L  Sedimentation rate     Status: Abnormal   Collection Time: 12/13/23 11:43 AM  Result Value Ref Range   Sed Rate 36 (H) 0 - 30 mm/h  C-reactive protein     Status: None   Collection Time: 12/13/23 11:43 AM  Result Value Ref Range   CRP <3.0 <8.0 mg/L  B12 and Folate Panel     Status: None   Collection Time: 12/13/23 11:43 AM  Result Value Ref Range   Vitamin B-12 673 200 - 1,100 pg/mL   Folate >24.0 ng/mL    Comment:                            Reference Range                            Low:           <3.4                            Borderline:    3.4-5.4                            Normal:        >5.4 .     Diabetic Foot Exam:     PHQ2/9:    12/13/2023   10:37 AM 05/18/2023    9:50 AM 04/27/2023   11:18 AM 02/13/2023    1:54 PM 12/20/2022    3:25 PM  Depression screen PHQ 2/9  Decreased Interest 1 0 0 0 0  Down, Depressed, Hopeless 1 0 0 1 0  PHQ - 2 Score 2 0 0 1 0  Altered sleeping 1 0  0 0  Tired, decreased energy 1 0  1 0  Change in appetite 0 0  0 0  Feeling bad or failure about yourself  0 0  0 0  Trouble concentrating 1 0  0 0  Moving slowly or fidgety/restless 0 0  0 0  Suicidal thoughts 0 0  0 0  PHQ-9 Score 5 0  2 0  Difficult doing work/chores Somewhat difficult    Not difficult at all    phq 9 is positive  Fall Risk:    12/13/2023   10:37 AM 05/18/2023    9:50 AM 04/27/2023   11:19 AM 02/13/2023    1:53 PM 12/20/2022    3:25 PM  Fall Risk   Falls in the past year? 0 0 0 0 0  Number falls in past yr: 0 0 0 0 0   Injury with Fall? 0 0 0 0 0  Risk for fall due to : No Fall Risks No Fall Risks No Fall Risks No Fall Risks No Fall Risks  Follow up Falls prevention discussed;Education provided;Falls evaluation completed Falls prevention discussed Falls prevention discussed Falls prevention discussed Falls prevention discussed;Education provided;Falls evaluation completed     Assessment and Plan Assessment & Plan Breast Abscess Abscess near sternum, partially drained, smaller than a quarter, healing but not resolved. Possible bacterial entry due to friction. - Prescribe  sulfamethoxazole for 7 days. - Continue warm compresses. - Report if abscess does not resolve or worsens. - Consider surgical referral if unresolved with antibiotics.  Premature Atrial Complexes Premature atrial complexes on EKG, no atrial fibrillation. Awaiting cardiology evaluation. Possible thyroid function check needed. - Refer to cardiologist. - Consider thyroid function test.  Hypertension Elevated blood pressure, variable home readings. Patient hesitant about medication, prefers cardiology input. Discussed risks of untreated hypertension. - Discuss DASH diet and salt reduction. - Monitor and record home blood pressure. - Discuss medication need with cardiologist.  General Health Maintenance Due for mammogram in June. - Schedule mammogram in June.  Follow-up Cardiology appointment scheduled for May, prefers local. - Arrange local cardiology appointment. - Advise to call for cancellations. - Follow up if breast abscess worsens.

## 2024-01-03 ENCOUNTER — Telehealth: Payer: Self-pay

## 2024-01-03 NOTE — Telephone Encounter (Signed)
 Copied from CRM [id number]. Topic: Referral - Question >> Jan 03, 2024  3:23 PM Geroge Baseman wrote: Reason for CRM: Patient is looking for her referral to cardiology to be sent to another office, she states she requested not to go to the one in [place]. She would like to see a cardiologist in [place]. Please call to advise on next steps

## 2024-01-15 NOTE — Progress Notes (Unsigned)
 [facility] Office Note  Date:  01/16/2024   ID:  Paula Mills, DOB [birth year], MRN [id number]  PCP:  Arleen Lacer, MD   Chief Complaint  Patient presents with   New Patient (Initial Visit)    Ref by Dr. Ava Lei for abnormal EKG. Patient c/o fluctuating blood pressure.     HPI:  Paula Mills is a 78 year old woman with past medical history of Aortic atherosclerosis PAD Carotid artery calcification Who presents by referral from Dr. Ava Lei for hypertension, abnormal EKG  On discussion today, she reports that she is doing well Blood pressure elevated today but she reports it is relatively well-controlled at home BP at home: SBP 130 to 140  EKG reviewed showing PACs, she reports that she is asymptomatic  CT ABD: 2016 images pulled up and reviewed Mild aortic atherosclerosis  Carotid u/s 1/18 Mild bilateral diffuse carotid intimal thickening with mild punctate plaque at the left carotid bifurcation. No flow limiting stenosis. Degree of stenosis less than 50% bilaterally.  Total cholesterol greater than 290, LDL 195 Prefers not to be on a statin  EKG personally reviewed by myself on todays visit EKG Interpretation Date/Time:  Tuesday January 16 2024 14:59:55 EDT Ventricular Rate:  77 PR Interval:  178 QRS Duration:  80 QT Interval:  378 QTC Calculation: 427 R Axis:   -38  Text Interpretation: Sinus rhythm with Premature atrial complexes Left axis deviation No previous ECGs available Confirmed by Belva Boyden [phone]) on 01/16/2024 3:13:22 PM   PMH:   has a past medical history of Hyperlipidemia and Vertigo.  PSH:    Past Surgical History:  Procedure Laterality Date   ABDOMINAL HYSTERECTOMY     APPENDECTOMY N/A 03/14/2015   Procedure: APPENDECTOMY;  Surgeon: Denese Finn, MD;  Location: [facility] ORS;  Service: General;  Laterality: N/A;   CESAREAN SECTION     COLONOSCOPY WITH PROPOFOL N/A 01/31/2017   Procedure: COLONOSCOPY WITH PROPOFOL;  Surgeon: Marnee Sink, MD;  Location: [facility] ENDOSCOPY;  Service: Endoscopy;  Laterality: N/A;   COLONOSCOPY WITH PROPOFOL N/A 05/24/2022   Procedure: COLONOSCOPY WITH PROPOFOL;  Surgeon: Marnee Sink, MD;  Location: [facility] ENDOSCOPY;  Service: Endoscopy;  Laterality: N/A;   LAPAROTOMY N/A 03/14/2015   Procedure: EXPLORATORY LAPAROTOMY;  Surgeon: Denese Finn, MD;  Location: [facility] ORS;  Service: General;  Laterality: N/A;    Current Outpatient Medications  Medication Sig Dispense Refill   Cholecalciferol (VITAMIN D) 2000 units CAPS Take 2,000 capsules by mouth daily.     COD LIVER OIL PO Take by mouth.     Multiple Vitamins-Minerals (WOMENS MULTIVITAMIN PO) Take 1 capsule by mouth daily.      vitamin B-12 (CYANOCOBALAMIN) 500 MCG tablet Take 1,000 mcg by mouth daily.     No current facility-administered medications for this visit.     Allergies:   Patient has no known allergies.   Social History:  The patient  reports that she has never smoked. She has never used smokeless tobacco. She reports that she does not currently use alcohol. She reports that she does not use drugs.   Family History:   family history includes Dementia in her mother; Hyperlipidemia in her mother and sister; Hypertension in her father, mother, and sister; Multiple sclerosis in her father and sister.    Review of Systems: Review of Systems  Constitutional: Negative.   HENT: Negative.    Respiratory: Negative.    Cardiovascular: Negative.   Gastrointestinal: Negative.   Musculoskeletal: Negative.   Neurological: Negative.  Psychiatric/Behavioral: Negative.    All other systems reviewed and are negative.   PHYSICAL EXAM: VS:  BP (!) 148/80 (BP Location: Right Arm, Patient Position: Sitting, Cuff Size: Normal)   Pulse 77   Ht 5\' 2"  (1.575 m)   Wt 161 lb (73 kg)   SpO2 98%   BMI 29.45 kg/m  , BMI Body mass index is 29.45 kg/m. GEN: Well nourished, well developed, in no acute distress HEENT: normal Neck: no JVD, carotid  bruits, or masses Cardiac: RRR; no murmurs, rubs, or gallops,no edema  Respiratory:  clear to auscultation bilaterally, normal work of breathing GI: soft, nontender, nondistended, + BS MS: no deformity or atrophy Skin: warm and dry, no rash Neuro:  Strength and sensation are intact Psych: euthymic mood, full affect    Recent Labs: 12/13/2023: ALT 20; BUN 10; Creat 0.62; Hemoglobin 11.7; Platelets 276; Potassium 3.8; Sodium 140    Lipid Panel Lab Results  Component Value Date   CHOL 294 (H) 12/13/2023   HDL 79 12/13/2023   LDLCALC 195 (H) 12/13/2023   TRIG 89 12/13/2023    Wt Readings from Last 3 Encounters:  01/16/24 161 lb (73 kg)  12/27/23 162 lb (73.5 kg)  12/13/23 158 lb 12.8 oz (72 kg)       ASSESSMENT AND PLAN:  Problem List Items Addressed This Visit       Cardiology Problems   PAD (peripheral artery disease) (HCC)   Relevant Orders   EKG 12-Lead (Completed)   Carotid artery calcification, bilateral - Primary   Relevant Orders   EKG 12-Lead (Completed)   Calcification of abdominal aorta (HCC)   Relevant Orders   EKG 12-Lead (Completed)   Other Visit Diagnoses       Abnormal EKG       Relevant Orders   EKG 12-Lead (Completed)      PACs Noted on EKG, asymptomatic Does not feel any indication to take beta-blockers either on a regular basis or as needed No further workup needed at this time  Aortic atherosclerosis CT scan abdomen images pulled up and reviewed mild aortic atherosclerosis at the iliac bifurcation Management of lipids as below  Carotid stenosis Mild bilateral carotid disease on prior ultrasound several years ago No bruits on exam  Hyperlipidemia Prefers not to be on a statin Recommend she consider Zetia 10 mg daily Would also consider bempedoic acid  Elevated blood pressure Recommend close monitoring of blood pressure at home and call us  if numbers continue to run high  Signed, Juanda Noon, M.D., Ph.D. [facility] [place], [place] [phone]

## 2024-01-16 ENCOUNTER — Encounter: Payer: Self-pay | Admitting: Cardiovascular Disease

## 2024-01-16 ENCOUNTER — Ambulatory Visit: Attending: Cardiovascular Disease | Admitting: Cardiovascular Disease

## 2024-01-16 VITALS — BP 148/80 | HR 77 | Ht 62.0 in | Wt 161.0 lb

## 2024-01-16 DIAGNOSIS — I6523 Occlusion and stenosis of bilateral carotid arteries: Secondary | ICD-10-CM | POA: Diagnosis not present

## 2024-01-16 DIAGNOSIS — R9431 Abnormal electrocardiogram [ECG] [EKG]: Secondary | ICD-10-CM

## 2024-01-16 DIAGNOSIS — I700 Atherosclerosis of aorta: Secondary | ICD-10-CM | POA: Diagnosis not present

## 2024-01-16 DIAGNOSIS — I739 Peripheral vascular disease, unspecified: Secondary | ICD-10-CM | POA: Diagnosis not present

## 2024-01-16 MED ORDER — EZETIMIBE 10 MG PO TABS: 10.0000 mg | ORAL_TABLET | Freq: Every day | ORAL | 3 refills | Status: DC

## 2024-01-16 NOTE — Patient Instructions (Addendum)
 Ask orthopedics about meloxican (hip pain)  You have PACs  Medication Instructions:  Please start zetia 10 mg daily for cholesterol  Monitor blood pressure, call if elevated   If you need a refill on your cardiac medications before your next appointment, please call your pharmacy.   Lab work: No new labs needed  Testing/Procedures: No new testing needed  Follow-Up: At [facility], you and your health needs are our priority.  As part of our continuing mission to provide you with exceptional heart care, we have created designated Provider Care Teams.  These Care Teams include your primary Cardiologist (physician) and Advanced Practice Providers (APPs -  Physician Assistants and Nurse Practitioners) who all work together to provide you with the care you need, when you need it.  You will need a follow up appointment as needed  Providers on your designated Care Team:   Laneta Pintos, NP Varney Gentleman, PA-C Cadence Gennaro Khat, [place]  COVID-19 Vaccine Information can be found at: [email or web address] For questions related to vaccine distribution or appointments, please email [email or web address] or call [phone].

## 2024-02-14 DIAGNOSIS — M1711 Unilateral primary osteoarthritis, right knee: Secondary | ICD-10-CM | POA: Insufficient documentation

## 2024-02-20 ENCOUNTER — Encounter (INDEPENDENT_AMBULATORY_CARE_PROVIDER_SITE_OTHER): Payer: Self-pay

## 2024-02-28 ENCOUNTER — Ambulatory Visit: Payer: PRIVATE HEALTH INSURANCE | Admitting: Cardiovascular Disease

## 2024-05-09 DIAGNOSIS — M4804 Spinal stenosis, thoracic region: Secondary | ICD-10-CM | POA: Insufficient documentation

## 2024-05-14 ENCOUNTER — Telehealth: Payer: Self-pay

## 2024-05-14 ENCOUNTER — Other Ambulatory Visit: Payer: Self-pay | Admitting: Family Medicine

## 2024-05-14 DIAGNOSIS — Z1231 Encounter for screening mammogram for malignant neoplasm of breast: Secondary | ICD-10-CM

## 2024-05-14 NOTE — Telephone Encounter (Signed)
 Last one done on 12/13/21.

## 2024-05-14 NOTE — Telephone Encounter (Signed)
 Patient states will wait to discuss further on future visit.

## 2024-05-14 NOTE — Telephone Encounter (Signed)
 Copied from CRM [id number]. Topic: Clinical - Medical Advice >> May 14, 2024  2:50 PM Myrick T wrote: Reason for CRM: patient would like to know if provider wants her to get a breast density test done. Please f/u with patient

## 2024-05-17 ENCOUNTER — Ambulatory Visit (INDEPENDENT_AMBULATORY_CARE_PROVIDER_SITE_OTHER): Payer: PRIVATE HEALTH INSURANCE

## 2024-05-17 ENCOUNTER — Ambulatory Visit (INDEPENDENT_AMBULATORY_CARE_PROVIDER_SITE_OTHER): Payer: PRIVATE HEALTH INSURANCE | Admitting: Family Medicine

## 2024-05-17 ENCOUNTER — Encounter: Payer: Self-pay | Admitting: Family Medicine

## 2024-05-17 VITALS — BP 148/82 | HR 60 | Resp 16 | Ht 62.0 in | Wt 160.0 lb

## 2024-05-17 VITALS — BP 148/80 | Ht 62.0 in | Wt 158.0 lb

## 2024-05-17 DIAGNOSIS — I8391 Asymptomatic varicose veins of right lower extremity: Secondary | ICD-10-CM

## 2024-05-17 DIAGNOSIS — I700 Atherosclerosis of aorta: Secondary | ICD-10-CM | POA: Diagnosis not present

## 2024-05-17 DIAGNOSIS — I10 Essential (primary) hypertension: Secondary | ICD-10-CM

## 2024-05-17 DIAGNOSIS — Z0000 Encounter for general adult medical examination without abnormal findings: Secondary | ICD-10-CM | POA: Diagnosis not present

## 2024-05-17 DIAGNOSIS — M5416 Radiculopathy, lumbar region: Secondary | ICD-10-CM | POA: Diagnosis not present

## 2024-05-17 DIAGNOSIS — I739 Peripheral vascular disease, unspecified: Secondary | ICD-10-CM

## 2024-05-17 DIAGNOSIS — I6523 Occlusion and stenosis of bilateral carotid arteries: Secondary | ICD-10-CM

## 2024-05-17 DIAGNOSIS — I498 Other specified cardiac arrhythmias: Secondary | ICD-10-CM

## 2024-05-17 NOTE — Patient Instructions (Signed)
 Paula Mills , Thank you for taking time out of your busy schedule to complete your Annual Wellness Visit with me. I enjoyed our conversation and look forward to speaking with you again next year. I, as well as your care team,  appreciate your ongoing commitment to your health goals. Please review the following plan we discussed and let me know if I can assist you in the future. Your Game plan/ To Do List    Referrals: If you haven't heard from the office you've been referred to, please reach out to them at the phone provided.   Follow up Visits: We will see or speak with you next year for your Next Medicare AWV with our clinical staff Have you seen your provider in the last 6 months (3 months if uncontrolled diabetes)? Yes  Clinician Recommendations:  Aim for 30 minutes of exercise or brisk walking, 6-8 glasses of water, and 5 servings of fruits and vegetables each day.       This is a list of the screenings recommended for you:  Health Maintenance  Topic Date Due   Zoster (Shingles) Vaccine (1 of 2) Never done   COVID-19 Vaccine (4 - 2024-25 season) 06/04/2023   Mammogram  03/21/2024   DTaP/Tdap/Td vaccine (1 - Tdap) 12/12/2024*   Medicare Annual Wellness Visit  05/17/2025   Colon Cancer Screening  05/25/2027   Pneumococcal Vaccine for age over 58  Completed   DEXA scan (bone density measurement)  Completed   Hepatitis C Screening  Completed   HPV Vaccine  Aged Out   Meningitis B Vaccine  Aged Out   Flu Shot  Discontinued  *Topic was postponed. The date shown is not the original due date.    Advanced directives: (Copy Requested) Please bring a copy of your health care power of attorney and living will to the office to be added to your chart at your convenience. You can mail to [facility] [address] [place], [place] [address] or email to [email or web address] Advance Care Planning is important because it:  [x]  Makes sure you receive the medical care that  is consistent with your values, goals, and preferences  [x]  It provides guidance to your family and loved ones and reduces their decisional burden about whether or not they are making the right decisions based on your wishes.  Follow the link provided in your after visit summary or read over the paperwork we have mailed to you to help you started getting your Advance Directives in place. If you need assistance in completing these, please reach out to us  so that we can help you!  See attachments for Preventive Care and Fall Prevention Tips.

## 2024-05-17 NOTE — Progress Notes (Signed)
 Name: Paula Mills   MRN: [id number]    DOB: 1946-09-01   Date:05/17/2024       Progress Note  Subjective  Chief Complaint  Chief Complaint  Patient presents with   Leg Swelling    R leg x2 weeks not better    Discussed the use of AI scribe software for clinical note transcription with the patient, who gave verbal consent to proceed.  History of Present Illness Paula Mills is a 78 year old female with peripheral arterial disease who presents with right leg swelling.  She experiences swelling in her right leg and foot, which she associates with episodes of severe hip pain related to her back issues. The swelling is more pronounced on the right side compared to the left, with no pain in the swollen areas. She believes the swelling is linked to increased blood pressure during episodes of back pain.  Her medical history includes peripheral arterial disease and varicose veins on her right calf, which were noted during a previous consultation. She has not experienced significant symptoms from the varicose veins until recently. She has previously seen a vascular surgeon for her peripheral arterial disease.  She has received three spinal injections over the past two years for back pain, with the last one being in August 2025. These injections provide partial relief for about four months. She recently started gabapentin on May 09, 2024, for nerve pain management. Sitting for prolonged periods, such as during car rides, exacerbates her back pain. She also notes that her physical activity level increased during a recent vacation, which may have contributed to the swelling.  Her current medications include gabapentin, meloxicam , and Zetia . She is not currently taking tizanidine    She has a history of arthritis, which contributes to morning stiffness.    Patient Active Problem List   Diagnosis Date Noted   Spinal stenosis of thoracic region 05/09/2024   Osteoarthritis of right knee  02/14/2024   Neuropathic pain 10/26/2023   PAD (peripheral artery disease) (HCC) 05/18/2023   Back pain with radiculopathy 05/18/2023   Primary osteoarthritis of both hips 05/18/2023   Degenerative disc disease, lumbar 02/13/2023   Varicose veins of right calf 12/21/2022   History of colonic polyps    Benign neoplasm of ascending colon    Polyp of sigmoid colon    Carotid artery calcification, bilateral 10/18/2016   Hypo-ovarianism 04/08/2016   Asymptomatic varicose veins 04/08/2016   Hiatal hernia 02/09/2016   Liver cyst 02/09/2016   Lumbar spondylosis 02/09/2016   Osteoporosis 02/09/2016   Dyslipidemia 02/09/2016   Vegetarian 02/09/2016   Vitamin D  deficiency 02/09/2016   Calcification of abdominal aorta (HCC) 02/09/2016   History of small bowel obstruction 03/12/2015    Social History   Tobacco Use   Smoking status: Never   Smokeless tobacco: Never  Substance Use Topics   Alcohol use: Not Currently    Alcohol/week: 0.0 standard drinks of alcohol     Current Outpatient Medications:    Cholecalciferol (VITAMIN D ) 2000 units CAPS, Take 2,000 capsules by mouth daily., Disp: , Rfl:    COD LIVER OIL PO, Take by mouth., Disp: , Rfl:    ezetimibe  (ZETIA ) 10 MG tablet, Take 1 tablet (10 mg total) by mouth daily., Disp: 90 tablet, Rfl: 3   gabapentin (NEURONTIN) 300 MG capsule, Take 300 mg by mouth 2 (two) times daily., Disp: , Rfl:    meloxicam  (MOBIC ) 15 MG tablet, Take 15 mg by mouth daily., Disp: , Rfl:  Multiple Vitamins-Minerals (WOMENS MULTIVITAMIN PO), Take 1 capsule by mouth daily. , Disp: , Rfl:    vitamin B-12 (CYANOCOBALAMIN) 500 MCG tablet, Take 1,000 mcg by mouth daily., Disp: , Rfl:   No Known Allergies  ROS  Ten systems reviewed and is negative except as mentioned in HPI    Objective  Vitals:   05/17/24 0917 05/17/24 1012  BP: (!) 154/90 (!) 148/82  Pulse: 60   Resp: 16   SpO2: 97%   Weight: 160 lb (72.6 kg)   Height: 5' 2 (1.575 m)     Body  mass index is 29.26 kg/m.  Physical Exam CONSTITUTIONAL: Patient appears well-developed and well-nourished. No distress. HEENT: Head atraumatic, normocephalic, neck supple. CARDIOVASCULAR: Normal rate, regular rhythm and normal heart sounds. No murmur heard. Mild bilateral edema, worse on right leg. Varicose veins on right calf  leg. PULMONARY: Effort normal and breath sounds normal. No respiratory distress. ABDOMINAL: There is no tenderness or distention. MUSCULOSKELETAL: Normal gait. Without gross motor or sensory deficit. PSYCHIATRIC: Patient has a normal mood and affect. Behavior is normal. Judgment and thought content normal.  Assessment & Plan Right lower extremity edema due to varicose veins Intermittent swelling of the right lower extremity associated with varicose veins, exacerbated by activity and summer heat. - Recommend compression stockings, specifically runners' compression socks, ensuring no tourniquet effect. - Consider referral to vascular surgeon if symptoms worsen or do not improve.  Lumbar radiculopathy due to bulging disc Chronic lumbar radiculopathy due to bulging disc causing hip and back pain. Managed with spinal injections and gabapentin. - Continue gabapentin for nerve pain management. - Consider meloxicam  for additional pain relief and inflammation control. - Encourage exploration of physical therapy options, specifically [facility], and provide referral upon request.  Osteoarthritis of multiple sites Chronic osteoarthritis causing morning stiffness, exacerbated by aging and altered gait due to back pain. - Continue meloxicam  for inflammation and pain management. - Advise gradual morning activity to reduce stiffness. - Encourage alternative physical activities, such as water aerobics or Pilates.  Peripheral arterial disease of lower extremities Peripheral arterial disease diagnosed with failed ABI screening. Not severe enough for significant  symptoms or surgical intervention. - Monitor symptoms and consider follow-up with vascular surgeon if condition worsens.  Atherosclerosis with carotid artery calcification Atherosclerosis with carotid artery calcification. Advised to reduce salt intake to manage blood pressure and cardiovascular risk. - Continue Zetia  for cholesterol management. - Encourage dietary modifications to reduce salt intake.  Hypertension Hypertension with episodes of elevated blood pressure, particularly during pain or inflammation. Resistant to medication, emphasis on lifestyle modifications. - Encourage regular monitoring of blood pressure. - Advise on lifestyle modifications, including salt reduction.  Hyperlipidemia Hyperlipidemia managed with Zetia . Last cholesterol check in March. Compliant with Zetia . - Continue Zetia  for cholesterol management. - Re-evaluate cholesterol levels at next visit.  Cardiac arrhythmia, atrial  Cardiac arrhythmia with extra beats. Monitored without medication due to preference. - Continue monitoring cardiac symptoms and arrhythmia.

## 2024-05-17 NOTE — Progress Notes (Signed)
 Because this visit was a virtual/telehealth visit,  certain criteria was not obtained, such a blood pressure, CBG if applicable, and timed get up and go. Any medications not marked as taking were not mentioned during the medication reconciliation part of the visit. Any vitals not documented were not able to be obtained due to this being a telehealth visit or patient was unable to self-report a recent blood pressure reading due to a lack of equipment at home via telehealth. Vitals that have been documented are verbally provided by the patient.  This visit was performed by a medical professional under my direct supervision. I was immediately available for consultation/collaboration. I have reviewed and agree with the Annual Wellness Visit documentation.  Subjective:   Paula Mills is a 78 y.o. who presents for a Medicare Wellness preventive visit.  As a reminder, Annual Wellness Visits don't include a physical exam, and some assessments may be limited, especially if this visit is performed virtually. We may recommend an in-person follow-up visit with your provider if needed.  Visit Complete: Virtual I connected with  Paula Mills on 05/17/24 by a audio enabled telemedicine application and verified that I am speaking with the correct person using two identifiers.  Patient Location: Home  Provider Location: Home Office  I discussed the limitations of evaluation and management by telemedicine. The patient expressed understanding and agreed to proceed.  Vital Signs: Because this visit was a virtual/telehealth visit, some criteria may be missing or patient reported. Any vitals not documented were not able to be obtained and vitals that have been documented are patient reported.  VideoDeclined- This patient declined Librarian, academic. Therefore the visit was completed with audio only.  Persons Participating in Visit: Patient.  AWV Questionnaire: No: Patient Medicare  AWV questionnaire was not completed prior to this visit.  Cardiac Risk Factors include: advanced age (>38men, >3 women);dyslipidemia;hypertension     Objective:    Today's Vitals   05/17/24 0837 05/17/24 0838  BP: (!) 148/80   Weight: 158 lb (71.7 kg)   Height: 5' 2 (1.575 m)   PainSc:  6    Body mass index is 28.9 kg/m.     05/17/2024    8:34 AM 04/27/2023   11:21 AM 07/12/2022    3:53 PM 05/24/2022    8:30 AM 04/27/2021    8:45 AM 04/02/2020    1:54 PM 03/28/2019    1:22 PM  Advanced Directives  Does Patient Have a Medical Advance Directive? Yes Yes Yes Yes Yes Yes Yes  Type of Estate agent of [place];Living will Healthcare Power of [place];Living will Living will Healthcare Power of [place];Living will Healthcare Power of [place];Living will Healthcare Power of [place];Living will Healthcare Power of [place];Living will  Does patient want to make changes to medical advance directive?   No - Patient declined  Yes (MAU/Ambulatory/Procedural Areas - Information given)    Copy of Healthcare Power of Attorney in Chart? No - copy requested No - copy requested  No - copy requested No - copy requested No - copy requested No - copy requested   Would patient like information on creating a medical advance directive?   No - Patient declined No - Patient declined        Data saved with a previous flowsheet row definition    Current Medications (verified) Outpatient Encounter Medications as of 05/17/2024  Medication Sig   Cholecalciferol (VITAMIN D ) 2000 units CAPS Take 2,000 capsules by mouth daily.  COD LIVER OIL PO Take by mouth.   ezetimibe  (ZETIA ) 10 MG tablet Take 1 tablet (10 mg total) by mouth daily.   gabapentin (NEURONTIN) 300 MG capsule Take 300 mg by mouth 2 (two) times daily.   Multiple Vitamins-Minerals (WOMENS MULTIVITAMIN PO) Take 1 capsule by mouth daily.    vitamin B-12 (CYANOCOBALAMIN) 500 MCG tablet Take 1,000 mcg by mouth daily.   No  facility-administered encounter medications on file as of 05/17/2024.    Allergies (verified) Patient has no known allergies.   History: Past Medical History:  Diagnosis Date   Hyperlipidemia    Vertigo    Past Surgical History:  Procedure Laterality Date   ABDOMINAL HYSTERECTOMY     APPENDECTOMY N/A 03/14/2015   Procedure: APPENDECTOMY;  Surgeon: Elsie Cable, MD;  Location: [facility] ORS;  Service: General;  Laterality: N/A;   CESAREAN SECTION     COLONOSCOPY WITH PROPOFOL  N/A 01/31/2017   Procedure: COLONOSCOPY WITH PROPOFOL ;  Surgeon: Rogelia Copping, MD;  Location: [facility] ENDOSCOPY;  Service: Endoscopy;  Laterality: N/A;   COLONOSCOPY WITH PROPOFOL  N/A 05/24/2022   Procedure: COLONOSCOPY WITH PROPOFOL ;  Surgeon: Copping Rogelia, MD;  Location: [facility] ENDOSCOPY;  Service: Endoscopy;  Laterality: N/A;   LAPAROTOMY N/A 03/14/2015   Procedure: EXPLORATORY LAPAROTOMY;  Surgeon: Elsie Cable, MD;  Location: [facility] ORS;  Service: General;  Laterality: N/A;   Family History  Problem Relation Age of Onset   Hyperlipidemia Mother    Hypertension Mother    Dementia Mother    Multiple sclerosis Father    Hypertension Father    Hypertension Sister    Hyperlipidemia Sister    Multiple sclerosis Sister    Social History   Socioeconomic History   Marital status: Married    Spouse name: Leonor   Number of children: 2   Years of education: Not on file   Highest education level: Master's degree (e.g., MA, MS, MEng, MEd, MSW, MBA)  Occupational History   Occupation: Retired  Tobacco Use   Smoking status: Never   Smokeless tobacco: Never  Vaping Use   Vaping status: Never Used  Substance and Sexual Activity   Alcohol use: Not Currently    Alcohol/week: 0.0 standard drinks of alcohol   Drug use: No   Sexual activity: Not Currently    Partners: Male  Other Topics Concern   Not on file  Social History Narrative   Husband has a pacemaker    Social Drivers of Health   Financial Resource  Strain: Low Risk  (05/17/2024)   Overall Financial Resource Strain (CARDIA)    Difficulty of Paying Living Expenses: Not hard at all  Food Insecurity: No Food Insecurity (05/17/2024)   Hunger Vital Sign    Worried About Running Out of Food in the Last Year: Never true    Ran Out of Food in the Last Year: Never true  Transportation Needs: No Transportation Needs (05/17/2024)   PRAPARE - Administrator, Civil Service (Medical): No    Lack of Transportation (Non-Medical): No  Physical Activity: Sufficiently Active (05/17/2024)   Exercise Vital Sign    Days of Exercise per Week: 5 days    Minutes of Exercise per Session: 80 min  Stress: No Stress Concern Present (05/17/2024)   [employer] of Occupational Health - Occupational Stress Questionnaire    Feeling of Stress: Not at all  Social Connections: Socially Integrated (05/17/2024)   Social Connection and Isolation Panel    Frequency of Communication with Friends and Family:  Three times a week    Frequency of Social Gatherings with Friends and Family: Once a week    Attends Religious Services: More than 4 times per year    Active Member of [employer] or Organizations: Yes    Attends Engineer, structural: More than 4 times per year    Marital Status: Married    Tobacco Counseling Counseling given: Not Answered    Clinical Intake:  Pre-visit preparation completed: Yes  Pain : 0-10 Pain Score: 6  Pain Type: Chronic pain Pain Location: Leg Pain Orientation: Left Pain Onset: Today Pain Frequency: Rarely     BMI - recorded: 28.9 Nutritional Status: BMI 25 -29 Overweight Nutritional Risks: None Diabetes: No  Lab Results  Component Value Date   HGBA1C 6.0 (H) 12/13/2023   HGBA1C 6.1 (H) 02/13/2023   HGBA1C 6.0 (H) 08/15/2022     How often do you need to have someone help you when you read instructions, pamphlets, or other written materials from your doctor or pharmacy?: 1 - Never  Interpreter Needed?:  No  Information entered by :: Vian Fluegel,CMA   Activities of Daily Living     05/17/2024    8:40 AM  In your present state of health, do you have any difficulty performing the following activities:  Hearing? 0  Vision? 0  Difficulty concentrating or making decisions? 0  Walking or climbing stairs? 0  Dressing or bathing? 0  Doing errands, shopping? 0  Preparing Food and eating ? N  Using the Toilet? N  In the past six months, have you accidently leaked urine? N  Do you have problems with loss of bowel control? N  Managing your Medications? N  Managing your Finances? N  Housekeeping or managing your Housekeeping? N    Patient Care Team: Sowles, Krichna, MD as PCP - General (Family Medicine)  I have updated your Care Teams any recent Medical Services you may have received from other providers in the past year.     Assessment:   This is a routine wellness examination for [facility].  Hearing/Vision screen Hearing Screening - Comments:: No difficulties hearing Vision Screening - Comments:: Patient wears glasses    Goals Addressed             This Visit's Progress    Patient Stated       Patient state she wants to be healthy        Depression Screen     05/17/2024    8:42 AM 12/13/2023   10:37 AM 05/18/2023    9:50 AM 04/27/2023   11:18 AM 02/13/2023    1:54 PM 12/20/2022    3:25 PM 10/18/2022    1:32 PM  PHQ 2/9 Scores  PHQ - 2 Score 0 2 0 0 1 0 0  PHQ- 9 Score 1 5 0  2 0 0    Fall Risk     05/17/2024    8:39 AM 12/13/2023   10:37 AM 05/18/2023    9:50 AM 04/27/2023   11:19 AM 02/13/2023    1:53 PM  Fall Risk   Falls in the past year? 0 0 0 0 0  Number falls in past yr: 0 0 0 0 0  Injury with Fall? 0 0 0 0 0  Risk for fall due to : No Fall Risks No Fall Risks No Fall Risks No Fall Risks No Fall Risks  Follow up Falls evaluation completed Falls prevention discussed;Education provided;Falls evaluation completed Falls prevention discussed Falls  prevention  discussed Falls prevention discussed    MEDICARE RISK AT HOME:  Medicare Risk at Home Any stairs in or around the home?: Yes If so, are there any without handrails?: No Home free of loose throw rugs in walkways, pet beds, electrical cords, etc?: Yes Adequate lighting in your home to reduce risk of falls?: Yes Life alert?: No Use of a cane, walker or w/c?: No Grab bars in the bathroom?: No Shower chair or bench in shower?: Yes Elevated toilet seat or a handicapped toilet?: Yes  TIMED UP AND GO:  Was the test performed?  No  Cognitive Function: 6CIT completed        05/17/2024    8:42 AM 04/27/2023   11:21 AM 03/28/2019    1:26 PM 03/23/2018    1:10 PM 03/20/2017   11:09 AM  6CIT Screen  What Year? 0 points 0 points 0 points 0 points 0 points  What month? 0 points 0 points 0 points 0 points 0 points  What time? 0 points 0 points 0 points 0 points 0 points  Count back from 20 0 points 0 points 0 points 0 points 0 points  Months in reverse 0 points 0 points 0 points 0 points 0 points  Repeat phrase 0 points 0 points 0 points 2 points 6 points  Total Score 0 points 0 points 0 points 2 points 6 points    Immunizations Immunization History  Administered Date(s) Administered   Influenza, Seasonal, Injecte, Preservative Fre 10/01/2013   Moderna Sars-Covid-2 Vaccination 10/28/2019, 11/25/2019   PFIZER(Purple Top)SARS-COV-2 Vaccination 07/28/2020   PNEUMOCOCCAL CONJUGATE-20 02/01/2022    Screening Tests Health Maintenance  Topic Date Due   Zoster Vaccines- Shingrix (1 of 2) Never done   COVID-19 Vaccine (4 - 2024-25 season) 06/04/2023   MAMMOGRAM  03/21/2024   DTaP/Tdap/Td (1 - Tdap) 12/12/2024 (Originally 08/20/1965)   Medicare Annual Wellness (AWV)  05/17/2025   Colonoscopy  05/25/2027   Pneumococcal Vaccine: 50+ Years  Completed   DEXA SCAN  Completed   Hepatitis C Screening  Completed   HPV VACCINES  Aged Out   Meningococcal B Vaccine  Aged Out   INFLUENZA VACCINE   Discontinued    Health Maintenance  Health Maintenance Due  Topic Date Due   Zoster Vaccines- Shingrix (1 of 2) Never done   COVID-19 Vaccine (4 - 2024-25 season) 06/04/2023   MAMMOGRAM  03/21/2024   Health Maintenance Items Addressed:declined vaccinations  Additional Screening:  Vision Screening: Recommended annual ophthalmology exams for early detection of glaucoma and other disorders of the eye. Would you like a referral to an eye doctor? No    Dental Screening: Recommended annual dental exams for proper oral hygiene  Community Resource Referral / Chronic Care Management: CRR required this visit?  No   CCM required this visit?  No   Plan:    I have personally reviewed and noted the following in the patient's chart:   Medical and social history Use of alcohol, tobacco or illicit drugs  Current medications and supplements including opioid prescriptions. Patient is not currently taking opioid prescriptions. Functional ability and status Nutritional status Physical activity Advanced directives List of other physicians Hospitalizations, surgeries, and ER visits in previous 12 months Vitals Screenings to include cognitive, depression, and falls Referrals and appointments  In addition, I have reviewed and discussed with patient certain preventive protocols, quality metrics, and best practice recommendations. A written personalized care plan for preventive services as well as general preventive health recommendations  were provided to patient.   Lyle MARLA Right, [place]   05/17/2024   After Visit Summary: (MyChart) Due to this being a telephonic visit, the after visit summary with patients personalized plan was offered to patient via MyChart   Notes: Nothing significant to report at this time.

## 2024-05-23 ENCOUNTER — Other Ambulatory Visit: Payer: Self-pay | Admitting: Family Medicine

## 2024-05-23 ENCOUNTER — Encounter: Payer: Self-pay | Admitting: Family Medicine

## 2024-05-23 DIAGNOSIS — M5416 Radiculopathy, lumbar region: Secondary | ICD-10-CM

## 2024-06-04 NOTE — Telephone Encounter (Signed)
 It has been switched and noted on previous referral.

## 2024-06-04 NOTE — Telephone Encounter (Unsigned)
 Copied from CRM [id number]. Topic: Referral - Question >> Jun 04, 2024  2:24 PM Wess RAMAN wrote: Reason for CRM: Patient would like her physical therapy referral switched to [facility].  Address:  [address] Phone: [phone] Provider: Camie Cleverly, PT, DPT

## 2024-06-11 ENCOUNTER — Ambulatory Visit
Admission: RE | Admit: 2024-06-11 | Discharge: 2024-06-11 | Disposition: A | Discharge status: Home / Self Care | Payer: PRIVATE HEALTH INSURANCE | Source: Ambulatory Visit | Attending: Family Medicine | Admitting: Family Medicine

## 2024-06-11 DIAGNOSIS — Z1231 Encounter for screening mammogram for malignant neoplasm of breast: Secondary | ICD-10-CM | POA: Diagnosis present

## 2024-06-14 ENCOUNTER — Ambulatory Visit: Payer: PRIVATE HEALTH INSURANCE | Admitting: Family Medicine

## 2024-06-18 ENCOUNTER — Ambulatory Visit (INDEPENDENT_AMBULATORY_CARE_PROVIDER_SITE_OTHER): Payer: PRIVATE HEALTH INSURANCE | Admitting: Family Medicine

## 2024-06-18 ENCOUNTER — Encounter: Payer: Self-pay | Admitting: Family Medicine

## 2024-06-18 VITALS — BP 140/84 | HR 71 | Resp 16 | Ht 62.0 in | Wt 155.5 lb

## 2024-06-18 DIAGNOSIS — I739 Peripheral vascular disease, unspecified: Secondary | ICD-10-CM

## 2024-06-18 DIAGNOSIS — I700 Atherosclerosis of aorta: Secondary | ICD-10-CM

## 2024-06-18 DIAGNOSIS — I8391 Asymptomatic varicose veins of right lower extremity: Secondary | ICD-10-CM

## 2024-06-18 DIAGNOSIS — R739 Hyperglycemia, unspecified: Secondary | ICD-10-CM

## 2024-06-18 DIAGNOSIS — M810 Age-related osteoporosis without current pathological fracture: Secondary | ICD-10-CM

## 2024-06-18 DIAGNOSIS — M5410 Radiculopathy, site unspecified: Secondary | ICD-10-CM

## 2024-06-18 DIAGNOSIS — E559 Vitamin D deficiency, unspecified: Secondary | ICD-10-CM

## 2024-06-18 DIAGNOSIS — I10 Essential (primary) hypertension: Secondary | ICD-10-CM | POA: Diagnosis not present

## 2024-06-18 DIAGNOSIS — Z789 Other specified health status: Secondary | ICD-10-CM

## 2024-06-18 MED ORDER — NEXLIZET 180-10 MG PO TABS: 1.0000 | ORAL_TABLET | Freq: Every day | ORAL | 3 refills | Status: AC

## 2024-06-18 NOTE — Progress Notes (Signed)
 Name: Paula Mills   MRN: [id number]    DOB: 1946/01/04   Date:06/18/2024       Progress Note  Subjective  Chief Complaint  Chief Complaint  Patient presents with   Medical Management of Chronic Issues   Discussed the use of AI scribe software for clinical note transcription with the patient, who gave verbal consent to proceed.  History of Present Illness Paula Mills is a 78 year old female with varicose veins and peripheral artery disease who presents for a follow-up visit.  She has persistent swelling in her right leg, more pronounced than in the left. She recalls seeing a vascular doctor in June of the previous year for varicose veins on the right calf and peripheral artery disease. She missed a follow-up visit in May and is due for another evaluation.  Her blood pressure has been better controlled recently, with readings of 144/86 and 142/88 during rest. She notes that her blood pressure tends to be higher during doctor visits, sometimes reaching the 170s or 180s. She refuses medication for HTN  She experiences chronic low back pain with radiculopathy, primarily affecting the right side and extending down to the foot. She is currently taking gabapentin 300 mg twice a day for pain management but is reluctant to continue due to concerns about medication use. She also takes meloxicam  for pain.  She has high cholesterol, with her LDL having decreased from 770 to 195. She is currently taking ezetimibe  (Zetia ) for cholesterol management. LDL still not at goal and willing to try Nexlizet  instead   She has prediabetes, with her A1c trending up to 6%. She is a vegetarian and is mindful of her diet, focusing on complex carbohydrates and avoiding junk food and desserts.  She has osteoporosis and is not currently taking any specific medication for it, though she is taking collagen. Her last bone density test was in March 2023, and she is due for another test as it has been two years.  She  takes B12 and vitamin D  supplements regularly.    Patient Active Problem List   Diagnosis Date Noted   Spinal stenosis of thoracic region 05/09/2024   Osteoarthritis of right knee 02/14/2024   Neuropathic pain 10/26/2023   PAD (peripheral artery disease) (HCC) 05/18/2023   Back pain with radiculopathy 05/18/2023   Primary osteoarthritis of both hips 05/18/2023   Degenerative disc disease, lumbar 02/13/2023   Varicose veins of right calf 12/21/2022   History of colonic polyps    Benign neoplasm of ascending colon    Polyp of sigmoid colon    Carotid artery calcification, bilateral 10/18/2016   Hypo-ovarianism 04/08/2016   Asymptomatic varicose veins 04/08/2016   Hiatal hernia 02/09/2016   Liver cyst 02/09/2016   Lumbar spondylosis 02/09/2016   Osteoporosis 02/09/2016   Dyslipidemia 02/09/2016   Vegetarian 02/09/2016   Vitamin D  deficiency 02/09/2016   Calcification of abdominal aorta (HCC) 02/09/2016   History of small bowel obstruction 03/12/2015    Past Surgical History:  Procedure Laterality Date   ABDOMINAL HYSTERECTOMY     APPENDECTOMY N/A 03/14/2015   Procedure: APPENDECTOMY;  Surgeon: Elsie Cable, MD;  Location: [facility] ORS;  Service: General;  Laterality: N/A;   CESAREAN SECTION     CHOLECYSTECTOMY     COLONOSCOPY WITH PROPOFOL  N/A 01/31/2017   Procedure: COLONOSCOPY WITH PROPOFOL ;  Surgeon: Rogelia Copping, MD;  Location: [facility] ENDOSCOPY;  Service: Endoscopy;  Laterality: N/A;   COLONOSCOPY WITH PROPOFOL  N/A 05/24/2022   Procedure: COLONOSCOPY WITH  PROPOFOL ;  Surgeon: Jinny Carmine, MD;  Location: [facility] ENDOSCOPY;  Service: Endoscopy;  Laterality: N/A;   LAPAROTOMY N/A 03/14/2015   Procedure: EXPLORATORY LAPAROTOMY;  Surgeon: Elsie Cable, MD;  Location: [facility] ORS;  Service: General;  Laterality: N/A;    Family History  Problem Relation Age of Onset   Hyperlipidemia Mother    Hypertension Mother    Dementia Mother    Varicose Veins Mother    Multiple sclerosis  Father    Hypertension Father    Hypertension Sister    Hyperlipidemia Sister    Multiple sclerosis Sister     Social History   Tobacco Use   Smoking status: Never   Smokeless tobacco: Never  Substance Use Topics   Alcohol use: Not Currently    Alcohol/week: 0.0 standard drinks of alcohol     Current Outpatient Medications:    COD LIVER OIL PO, Take by mouth., Disp: , Rfl:    gabapentin (NEURONTIN) 300 MG capsule, Take 300 mg by mouth 2 (two) times daily., Disp: , Rfl:    meloxicam  (MOBIC ) 15 MG tablet, Take 15 mg by mouth daily., Disp: , Rfl:    Multiple Vitamins-Minerals (WOMENS MULTIVITAMIN PO), Take 1 capsule by mouth daily. , Disp: , Rfl:    Cholecalciferol (VITAMIN D ) 2000 units CAPS, Take 2,000 capsules by mouth daily., Disp: , Rfl:    ezetimibe  (ZETIA ) 10 MG tablet, Take 1 tablet (10 mg total) by mouth daily., Disp: 90 tablet, Rfl: 3   vitamin B-12 (CYANOCOBALAMIN) 500 MCG tablet, Take 1,000 mcg by mouth daily., Disp: , Rfl:   No Known Allergies  I personally reviewed active problem list, medication list, allergies with the patient/caregiver today.   ROS  Ten systems reviewed and is negative except as mentioned in HPI    Objective Physical Exam  CONSTITUTIONAL: Patient appears well-developed and well-nourished. No distress. HEENT: Head atraumatic, normocephalic, neck supple. CARDIOVASCULAR: Normal rate, regular rhythm and normal heart sounds. No murmur heard. Left leg with mild swelling. PULMONARY: Effort normal and breath sounds normal. No respiratory distress. ABDOMINAL: There is no tenderness or distention. MUSCULOSKELETAL: gets up slowly PSYCHIATRIC: Patient has a normal mood and affect. Behavior is normal. Judgment and thought content normal.  Vitals:   06/18/24 0800 06/18/24 0810  BP: (!) 144/86 (!) 142/88  Pulse: 71   Resp: 16   SpO2: 98%   Weight: 155 lb 8 oz (70.5 kg)   Height: 5' 2 (1.575 m)     Body mass index is 28.44  kg/m.   PHQ2/9:    06/18/2024    7:56 AM 05/17/2024    9:17 AM 05/17/2024    8:42 AM 12/13/2023   10:37 AM 05/18/2023    9:50 AM  Depression screen PHQ 2/9  Decreased Interest 0 0 0 1 0  Down, Depressed, Hopeless 0 0 0 1 0  PHQ - 2 Score 0 0 0 2 0  Altered sleeping 0 1 1 1  0  Tired, decreased energy 0 0 0 1 0  Change in appetite 0 0 0 0 0  Feeling bad or failure about yourself  0 0 0 0 0  Trouble concentrating 0 0 0 1 0  Moving slowly or fidgety/restless 0 0 0 0 0  Suicidal thoughts 0 0 0 0 0  PHQ-9 Score 0 1 1 5  0  Difficult doing work/chores Not difficult at all Not difficult at all Not difficult at all Somewhat difficult     phq 9 is negative  Fall  Risk:    06/18/2024    7:56 AM 05/17/2024    9:16 AM 05/17/2024    8:39 AM 12/13/2023   10:37 AM 05/18/2023    9:50 AM  Fall Risk   Falls in the past year? 0 0 0 0 0  Number falls in past yr: 0 0 0 0 0  Injury with Fall? 0 0 0 0 0  Risk for fall due to : No Fall Risks No Fall Risks No Fall Risks No Fall Risks No Fall Risks  Follow up Falls evaluation completed Falls evaluation completed Falls evaluation completed Falls prevention discussed;Education provided;Falls evaluation completed Falls prevention discussed      Assessment & Plan Varicose veins of right lower extremity and peripheral artery disease Chronic varicose veins with associated PAD. Increased swelling likely due to varicose veins and prolonged sitting or standing. Overdue for vascular follow-up. - Refer to vascular specialist for follow-up evaluation. - Recommend wearing compression stockings to manage swelling.  Chronic low back pain with lumbar radiculopathy Chronic low back pain with lumbar radiculopathy, primarily affecting the right side. Gabapentin used for pain management. Discussed spinal cord stimulation as a potential treatment option. Explained risks and reversibility of spinal cord stimulation. - Continue gabapentin for pain management. - Discuss  spinal cord stimulation as a potential treatment option. - Ensure physical therapy referral is in place for [facility] therapy.  Osteoarthritis Chronic osteoarthritis with associated inflammation. Sedimentation rate slightly elevated. Discussed lifestyle modifications for symptom management. - Monitor inflammatory markers. - Discuss lifestyle modifications to manage symptoms.  Osteoporosis/post menopausal  Osteoporosis with last bone density scan in March 2023. No current medication, taking collagen supplements. Medicare covers bone density scan every two years if not on medication. - Order bone density scan.  Hyperlipidemia Chronic hyperlipidemia managed with ezetimibe . Discussed switching to Nexlizet  for better cholesterol control. - Switch from ezetimibe  to Nexlizet  for cholesterol management. - Order lipid panel in two months to assess response to Nexlizet .  Hypertension Hypertension with recent readings of 144/86 and 142/88. Prefers to manage without medication.  Prediabetes Prediabetes with A1c of 6.0%. Vegetarian diet. Advised to monitor diet and avoid junk food and desserts. - Monitor A1c and blood glucose levels. - Discuss dietary modifications to manage blood sugar levels.  General Health Maintenance Routine health maintenance discussed, including regular screenings and lifestyle modifications. - Order comprehensive blood work in two months, including cholesterol, kidney and liver function, A1c, vitamin D , B12, and CBC. - Coordinate bone density scan with future mammogram scheduling.

## 2024-07-26 ENCOUNTER — Other Ambulatory Visit (INDEPENDENT_AMBULATORY_CARE_PROVIDER_SITE_OTHER): Payer: Self-pay | Admitting: Nurse Practitioner

## 2024-07-26 DIAGNOSIS — I8391 Asymptomatic varicose veins of right lower extremity: Secondary | ICD-10-CM

## 2024-07-29 ENCOUNTER — Ambulatory Visit (INDEPENDENT_AMBULATORY_CARE_PROVIDER_SITE_OTHER): Payer: PRIVATE HEALTH INSURANCE

## 2024-07-29 ENCOUNTER — Encounter (INDEPENDENT_AMBULATORY_CARE_PROVIDER_SITE_OTHER): Payer: Self-pay | Admitting: Nurse Practitioner

## 2024-07-29 ENCOUNTER — Ambulatory Visit (INDEPENDENT_AMBULATORY_CARE_PROVIDER_SITE_OTHER): Payer: PRIVATE HEALTH INSURANCE | Admitting: Nurse Practitioner

## 2024-07-29 VITALS — BP 177/95 | HR 83 | Resp 18 | Wt 160.0 lb

## 2024-07-29 DIAGNOSIS — I8391 Asymptomatic varicose veins of right lower extremity: Secondary | ICD-10-CM | POA: Diagnosis not present

## 2024-07-29 DIAGNOSIS — M51361 Other intervertebral disc degeneration, lumbar region with lower extremity pain only: Secondary | ICD-10-CM

## 2024-07-29 NOTE — Progress Notes (Signed)
 Subjective:    Patient ID: Paula Mills, female    DOB: 08/22/1946, 78 y.o.   MRN: [id number] Chief Complaint  Patient presents with   Follow-up    Ref [facility] consult vv w/right calf pain     The patient returns today for follow-up evaluation of varicose veins in the right lower extremity.  Initially she was having pain in the right calf area but that is resolved.  She notes that she has essentially attributed this to her sciatica after doing some treatment measures at home.  She notes that she has not worn compression consistently but occasionally.  She does elevate her lower extremities.  She does have some swelling in her right foot.  Today noninvasive study showed no evidence of DVT but she does have superficial venous reflux in her great saphenous vein which is consistent with her studies about a year ago.    Review of Systems  Cardiovascular:  Positive for leg swelling.  All other systems reviewed and are negative.      Objective:   Physical Exam Vitals reviewed.  HENT:     Head: Normocephalic.  Cardiovascular:     Rate and Rhythm: Normal rate.     Pulses: Normal pulses.  Pulmonary:     Effort: Pulmonary effort is normal.  Musculoskeletal:        General: Tenderness present.  Skin:    General: Skin is warm and dry.  Neurological:     Mental Status: She is alert and oriented to person, place, and time.  Psychiatric:        Mood and Affect: Mood normal.        Behavior: Behavior normal.        Thought Content: Thought content normal.        Judgment: Judgment normal.     BP (!) 177/95   Pulse 83   Resp 18   Wt 160 lb (72.6 kg)   BMI 29.26 kg/m   Past Medical History:  Diagnosis Date   Hyperlipidemia    Vertigo     Social History   Socioeconomic History   Marital status: Married    Spouse name: Leonor   Number of children: 2   Years of education: Not on file   Highest education level: Master's degree (e.g., MA, MS, MEng, MEd, MSW, MBA)   Occupational History   Occupation: Retired  Tobacco Use   Smoking status: Never   Smokeless tobacco: Never  Vaping Use   Vaping status: Never Used  Substance and Sexual Activity   Alcohol use: Not Currently    Alcohol/week: 0.0 standard drinks of alcohol   Drug use: No   Sexual activity: Not Currently    Partners: Male  Other Topics Concern   Not on file  Social History Narrative   Husband has a pacemaker    Social Drivers of Health   Financial Resource Strain: Low Risk  (06/14/2024)   Overall Financial Resource Strain (CARDIA)    Difficulty of Paying Living Expenses: Not hard at all  Food Insecurity: No Food Insecurity (06/14/2024)   Hunger Vital Sign    Worried About Running Out of Food in the Last Year: Never true    Ran Out of Food in the Last Year: Never true  Transportation Needs: No Transportation Needs (06/14/2024)   PRAPARE - Administrator, Civil Service (Medical): No    Lack of Transportation (Non-Medical): No  Physical Activity: Sufficiently Active (06/14/2024)   Exercise Vital  Sign    Days of Exercise per Week: 5 days    Minutes of Exercise per Session: 90 min  Stress: No Stress Concern Present (06/14/2024)   [employer] of Occupational Health - Occupational Stress Questionnaire    Feeling of Stress: Only a little  Social Connections: Socially Integrated (06/14/2024)   Social Connection and Isolation Panel    Frequency of Communication with Friends and Family: More than three times a week    Frequency of Social Gatherings with Friends and Family: More than three times a week    Attends Religious Services: More than 4 times per year    Active Member of Clubs or Organizations: Yes    Attends Banker Meetings: More than 4 times per year    Marital Status: Married  Catering Manager Violence: Not At Risk (05/17/2024)   Humiliation, Afraid, Rape, and Kick questionnaire    Fear of Current or Ex-Partner: No    Emotionally Abused: No     Physically Abused: No    Sexually Abused: No    Past Surgical History:  Procedure Laterality Date   ABDOMINAL HYSTERECTOMY     APPENDECTOMY N/A 03/14/2015   Procedure: APPENDECTOMY;  Surgeon: Elsie Cable, MD;  Location: [facility] ORS;  Service: General;  Laterality: N/A;   CESAREAN SECTION     CHOLECYSTECTOMY     COLONOSCOPY WITH PROPOFOL  N/A 01/31/2017   Procedure: COLONOSCOPY WITH PROPOFOL ;  Surgeon: Rogelia Copping, MD;  Location: [facility] ENDOSCOPY;  Service: Endoscopy;  Laterality: N/A;   COLONOSCOPY WITH PROPOFOL  N/A 05/24/2022   Procedure: COLONOSCOPY WITH PROPOFOL ;  Surgeon: Copping Rogelia, MD;  Location: [facility] END[facility]COPY;  Service: Endoscopy;  Laterality: N/A;   LAPAROTOMY N/A 03/14/2015   Procedure: EXPLORATORY LAPAROTOMY;  Surgeon: Elsie Cable, MD;  Location: [facility] ORS;  Service: General;  Laterality: N/A;    Family History  Problem Relation Age of Onset   Hyperlipidemia Mother    Hypertension Mother    Dementia Mother    Varicose Veins Mother    Multiple sclerosis Father    Hypertension Father    Hypertension Sister    Hyperlipidemia Sister    Multiple sclerosis Sister     No Known Allergies     Latest Ref Rng & Units 12/13/2023   11:43 AM 10/18/2022    2:31 PM 08/15/2022    3:35 PM  CBC  WBC 3.8 - 10.8 Thousand/uL 5.2  5.9  5.2   Hemoglobin 11.7 - 15.5 g/dL 88.2  88.1  88.1   Hematocrit 35.0 - 45.0 % 36.6  35.0  35.5   Platelets 140 - 400 Thousand/uL 276  290  257       CMP     Component Value Date/Time   NA 140 12/13/2023 1143   NA 142 02/09/2016 1249   K 3.8 12/13/2023 1143   CL 104 12/13/2023 1143   CO2 27 12/13/2023 1143   GLUCOSE 87 12/13/2023 1143   BUN 10 12/13/2023 1143   BUN 7 (L) 02/09/2016 1249   CREATININE 0.62 12/13/2023 1143   CALCIUM 9.3 12/13/2023 1143   PROT 7.7 12/13/2023 1143   PROT 7.7 02/09/2016 1249   ALBUMIN 4.1 04/10/2017 1513   ALBUMIN 4.4 02/09/2016 1249   AST 22 12/13/2023 1143   ALT 20 12/13/2023 1143   ALKPHOS 90  04/10/2017 1513   BILITOT 0.5 12/13/2023 1143   BILITOT 0.4 02/09/2016 1249   EGFR 91 12/13/2023 1143   GFRNONAA 87 07/29/2020 1437     No results  found.     Assessment & Plan:   1. Varicose veins of right calf (Primary) The patient has notable varicosities and venous reflux in the right lower extremity.  This is consistent with her stiffness about a year ago.  It certainly can be attributed to a cause for her foot swelling.  I have discussed options with the patient regarding her venous reflux including conservative measures including use of medical grade compression, elevation and activity.  In addition there is also the option of an endovenous laser ablation.  Following discussion with the patient regarding options she elects to move forward with conservative therapy at this time.  She notes that she does have worsening problems with her beginning she will reach out to us  for further treatment.  2. Degeneration of intervertebral disc of lumbar region with lower extremity pain He notes that the pain that she was experiencing her calf has resolved.  She attributes this to her sciatica.  She will continue to follow with PCP in this regard.   Current Outpatient Medications on File Prior to Visit  Medication Sig Dispense Refill   Bempedoic Acid-Ezetimibe  (NEXLIZET ) 180-10 MG TABS Take 1 tablet by mouth daily. In place of ezetimibe  for cholesterol 90 tablet 3   Cholecalciferol (VITAMIN D ) 2000 units CAPS Take 2,000 capsules by mouth daily.     COD LIVER OIL PO Take by mouth.     gabapentin (NEURONTIN) 300 MG capsule Take 300 mg by mouth 2 (two) times daily.     meloxicam  (MOBIC ) 15 MG tablet Take 15 mg by mouth daily.     Multiple Vitamins-Minerals (WOMENS MULTIVITAMIN PO) Take 1 capsule by mouth daily.      vitamin B-12 (CYANOCOBALAMIN) 500 MCG tablet Take 1,000 mcg by mouth daily.     No current facility-administered medications on file prior to visit.    There are no Patient  Instructions on file for this visit. No follow-ups on file.   Zeddie Njie E Cadell Gabrielson, NP

## 2024-10-31 ENCOUNTER — Encounter: Payer: Self-pay | Admitting: Ophthalmology

## 2024-11-01 NOTE — Discharge Instructions (Signed)

## 2024-11-04 ENCOUNTER — Encounter: Payer: Self-pay | Admitting: Ophthalmology

## 2024-11-05 ENCOUNTER — Encounter: Payer: Self-pay | Admitting: Ophthalmology

## 2024-11-05 ENCOUNTER — Other Ambulatory Visit: Payer: Self-pay

## 2024-11-05 ENCOUNTER — Ambulatory Visit
Admission: RE | Admit: 2024-11-05 | Discharge: 2024-11-05 | Disposition: A | Discharge status: Home / Self Care | Payer: PRIVATE HEALTH INSURANCE | Attending: Ophthalmology | Admitting: Ophthalmology

## 2024-11-05 ENCOUNTER — Encounter
Admission: RE | Disposition: A | Discharge status: Home / Self Care | Payer: Self-pay | Source: Home / Self Care | Attending: Ophthalmology

## 2024-11-05 ENCOUNTER — Encounter: Payer: Self-pay | Admitting: Anesthesiology

## 2024-11-05 DIAGNOSIS — H2511 Age-related nuclear cataract, right eye: Secondary | ICD-10-CM | POA: Insufficient documentation

## 2024-11-05 HISTORY — DX: Myoneural disorder, unspecified: G70.9

## 2024-11-05 HISTORY — DX: Unspecified osteoarthritis, unspecified site: M19.90

## 2024-11-05 MED ORDER — MIDAZOLAM HCL 2 MG/2ML IJ SOLN
INTRAMUSCULAR | Status: AC
  Filled 2024-11-05: qty 2

## 2024-11-05 MED ORDER — CYCLOPENTOLATE HCL 2 % OP SOLN
1.0000 [drp] | OPHTHALMIC | Status: AC | PRN
  Administered 2024-11-05 (×3): 1 [drp] via OPHTHALMIC

## 2024-11-05 MED ORDER — PHENYLEPHRINE HCL 10 % OP SOLN
1.0000 [drp] | OPHTHALMIC | Status: AC | PRN
  Administered 2024-11-05 (×3): 1 [drp] via OPHTHALMIC

## 2024-11-05 MED ORDER — SIGHTPATH DOSE#1 NA CHONDROIT SULF-NA HYALURON 40-17 MG/ML IO SOLN
INTRAOCULAR | Status: DC | PRN
  Administered 2024-11-05: 1 mL via INTRAOCULAR

## 2024-11-05 MED ORDER — LIDOCAINE HCL (PF) 2 % IJ SOLN
INTRAOCULAR | Status: DC | PRN
  Administered 2024-11-05: 2 mL

## 2024-11-05 MED ORDER — LACTATED RINGERS IV SOLN: INTRAVENOUS | Status: DC

## 2024-11-05 MED ORDER — CYCLOPENTOLATE HCL 2 % OP SOLN
OPHTHALMIC | Status: AC
  Filled 2024-11-05: qty 2

## 2024-11-05 MED ORDER — SIGHTPATH DOSE#1 BSS IO SOLN
INTRAOCULAR | Status: DC | PRN
  Administered 2024-11-05: 46 mL via OPHTHALMIC

## 2024-11-05 MED ORDER — FENTANYL CITRATE (PF) 100 MCG/2ML IJ SOLN
INTRAMUSCULAR | Status: AC
  Filled 2024-11-05: qty 2

## 2024-11-05 MED ORDER — TETRACAINE HCL 0.5 % OP SOLN
OPHTHALMIC | Status: AC
  Filled 2024-11-05: qty 4

## 2024-11-05 MED ORDER — FENTANYL CITRATE (PF) 100 MCG/2ML IJ SOLN
INTRAMUSCULAR | Status: DC | PRN
  Administered 2024-11-05: 50 ug via INTRAVENOUS

## 2024-11-05 MED ORDER — SIGHTPATH DOSE#1 BSS IO SOLN
INTRAOCULAR | Status: DC | PRN
  Administered 2024-11-05: 15 mL via INTRAOCULAR

## 2024-11-05 MED ORDER — PHENYLEPHRINE HCL 10 % OP SOLN
OPHTHALMIC | Status: AC
  Filled 2024-11-05: qty 5

## 2024-11-05 MED ORDER — BRIMONIDINE TARTRATE-TIMOLOL 0.2-0.5 % OP SOLN
OPHTHALMIC | Status: DC | PRN
  Administered 2024-11-05: 1 [drp] via OPHTHALMIC

## 2024-11-05 MED ORDER — MOXIFLOXACIN HCL 0.5 % OP SOLN
OPHTHALMIC | Status: DC | PRN
  Administered 2024-11-05: .2 mL via OPHTHALMIC

## 2024-11-05 MED ORDER — TETRACAINE HCL 0.5 % OP SOLN
1.0000 [drp] | OPHTHALMIC | Status: DC | PRN
  Administered 2024-11-05 (×3): 1 [drp] via OPHTHALMIC

## 2024-11-05 MED ORDER — MIDAZOLAM HCL (PF) 2 MG/2ML IJ SOLN
INTRAMUSCULAR | Status: DC | PRN
  Administered 2024-11-05 (×2): 1 mg via INTRAVENOUS

## 2024-11-05 NOTE — Op Note (Signed)
 PREOPERATIVE DIAGNOSIS:  Nuclear sclerotic cataract of the right eye.   POSTOPERATIVE DIAGNOSIS:  Right Eye Cataract   OPERATIVE PROCEDURE:ORPROCALL@   SURGEON:  Elsie Carmine, MD.   ANESTHESIA:  Anesthesiologist: Kradel, Brian K, MD CRNA: Jahoo, Sonia, CRNA  1.      Managed anesthesia care. 2.      0.39ml of Shugarcaine was instilled in the eye following the paracentesis.   COMPLICATIONS:  None.   TECHNIQUE:   Stop and chop   DESCRIPTION OF PROCEDURE:  The patient was examined and consented in the preoperative holding area where the aforementioned topical anesthesia was applied to the right eye and then brought back to the Operating Room where the right eye was prepped and draped in the usual sterile ophthalmic fashion and a lid speculum was placed. A paracentesis was created with the side port blade and the anterior chamber was filled with viscoelastic. A near clear corneal incision was performed with the steel keratome. A continuous curvilinear capsulorrhexis was performed with a cystotome followed by the capsulorrhexis forceps. Hydrodissection and hydrodelineation were carried out with BSS on a blunt cannula. The lens was removed in a stop and chop  technique and the remaining cortical material was removed with the irrigation-aspiration handpiece. The capsular bag was inflated with viscoelastic and the intraocular lens was placed in the capsular bag without complication. The remaining viscoelastic was removed from the eye with the irrigation-aspiration handpiece. The wounds were hydrated. The anterior chamber was flushed with BSS and the eye was inflated to physiologic pressure. 0.68ml of Vigamox  was placed in the anterior chamber. The wounds were found to be water tight. The eye was dressed with Combigan . The patient was given protective glasses to wear throughout the day and a shield with which to sleep tonight. The patient was also given drops with which to begin a drop regimen today and  will follow-up with me in one day. Implant Name Type Inv. Item Serial No. Manufacturer Lot No. LRB No. Used Action  LENS IOL TECNIS EYHANCE 24.0 - [id number] Intraocular Lens LENS IOL TECNIS EYHANCE 24.0 [id number] [employer]  Right 1 Implanted   Procedures: PHACOEMULSIFICATION, CATARACT, WITH IOL INSERTION 4.80 00:36.1 (Right)  Electronically signed: Elsie Carmine 11/05/2024 8:32 AM

## 2024-11-05 NOTE — Anesthesia Preprocedure Evaluation (Addendum)
 Anesthesia Evaluation  Patient identified by MRN, date of birth, ID band Patient awake    Reviewed: Allergy & Precautions, H&P , NPO status , Patient's Chart, lab work & pertinent test results  Airway Mallampati: II  TM Distance: >3 FB Neck ROM: Full    Dental no notable dental hx.    Pulmonary neg pulmonary ROS   Pulmonary exam normal breath sounds clear to auscultation       Cardiovascular negative cardio ROS Normal cardiovascular exam Rhythm:Regular Rate:Normal     Neuro/Psych negative neurological ROS  negative psych ROS   GI/Hepatic negative GI ROS, Neg liver ROS,,,  Endo/Other  negative endocrine ROS    Renal/GU negative Renal ROS  negative genitourinary   Musculoskeletal negative musculoskeletal ROS (+)    Abdominal   Peds negative pediatric ROS (+)  Hematology negative hematology ROS (+)   Anesthesia Other Findings   Reproductive/Obstetrics negative OB ROS                             Anesthesia Physical Anesthesia Plan  ASA: 2  Anesthesia Plan: General   Post-op Pain Management:    Induction: Intravenous  PONV Risk Score and Plan:   Airway Management Planned:   Additional Equipment:   Intra-op Plan:   Post-operative Plan: Extubation in OR  Informed Consent: I have reviewed the patients History and Physical, chart, labs and discussed the procedure including the risks, benefits and alternatives for the proposed anesthesia with the patient or authorized representative who has indicated his/her understanding and acceptance.     Dental advisory given  Plan Discussed with: CRNA  Anesthesia Plan Comments:        Anesthesia Quick Evaluation

## 2024-11-19 ENCOUNTER — Ambulatory Visit: Admit: 2024-11-19 | Payer: PRIVATE HEALTH INSURANCE | Admitting: Ophthalmology

## 2024-11-19 SURGERY — PHACOEMULSIFICATION, CATARACT, WITH IOL INSERTION
Anesthesia: Topical | Laterality: Left

## 2024-12-17 ENCOUNTER — Ambulatory Visit: Payer: PRIVATE HEALTH INSURANCE | Admitting: Family Medicine
